# Patient Record
Sex: Female | Born: 1970 | Race: Black or African American | Hispanic: No | Marital: Single | Smoking: Never smoker
Health system: Southern US, Community
[De-identification: ages and names within clinical notes are randomized; demographics above are authoritative.]

## PROBLEM LIST (undated history)

## (undated) DIAGNOSIS — T7840XA Allergy, unspecified, initial encounter: Secondary | ICD-10-CM

## (undated) DIAGNOSIS — Z5189 Encounter for other specified aftercare: Secondary | ICD-10-CM

## (undated) DIAGNOSIS — E785 Hyperlipidemia, unspecified: Secondary | ICD-10-CM

## (undated) DIAGNOSIS — I10 Essential (primary) hypertension: Secondary | ICD-10-CM

## (undated) DIAGNOSIS — B001 Herpesviral vesicular dermatitis: Secondary | ICD-10-CM

## (undated) DIAGNOSIS — E049 Nontoxic goiter, unspecified: Secondary | ICD-10-CM

## (undated) HISTORY — DX: Nontoxic goiter, unspecified: E04.9

## (undated) HISTORY — DX: Herpesviral vesicular dermatitis: B00.1

## (undated) HISTORY — DX: Allergy, unspecified, initial encounter: T78.40XA

## (undated) HISTORY — DX: Encounter for other specified aftercare: Z51.89

## (undated) HISTORY — DX: Hyperlipidemia, unspecified: E78.5

## (undated) HISTORY — PX: COLONOSCOPY: SHX174

---

## 2005-08-04 ENCOUNTER — Inpatient Hospital Stay (HOSPITAL_COMMUNITY): Admission: EM | Admit: 2005-08-04 | Discharge: 2005-08-05 | Payer: Self-pay | Admitting: Emergency Medicine

## 2005-08-04 ENCOUNTER — Ambulatory Visit: Payer: Self-pay | Admitting: Internal Medicine

## 2005-08-09 ENCOUNTER — Ambulatory Visit: Payer: Self-pay | Admitting: Internal Medicine

## 2006-05-02 ENCOUNTER — Observation Stay (HOSPITAL_COMMUNITY): Admission: AD | Admit: 2006-05-02 | Discharge: 2006-05-03 | Payer: Self-pay | Admitting: *Deleted

## 2006-05-05 ENCOUNTER — Other Ambulatory Visit: Admission: RE | Admit: 2006-05-05 | Discharge: 2006-05-05 | Payer: Self-pay | Admitting: Obstetrics and Gynecology

## 2006-07-22 HISTORY — PX: TOTAL ABDOMINAL HYSTERECTOMY: SHX209

## 2006-07-22 HISTORY — PX: ABDOMINAL HYSTERECTOMY: SHX81

## 2007-03-06 ENCOUNTER — Ambulatory Visit: Payer: Self-pay | Admitting: Internal Medicine

## 2007-03-06 ENCOUNTER — Inpatient Hospital Stay (HOSPITAL_COMMUNITY): Admission: EM | Admit: 2007-03-06 | Discharge: 2007-03-07 | Payer: Self-pay | Admitting: Emergency Medicine

## 2007-07-07 ENCOUNTER — Inpatient Hospital Stay (HOSPITAL_COMMUNITY): Admission: RE | Admit: 2007-07-07 | Discharge: 2007-07-10 | Payer: Self-pay | Admitting: Obstetrics and Gynecology

## 2007-07-08 ENCOUNTER — Encounter (INDEPENDENT_AMBULATORY_CARE_PROVIDER_SITE_OTHER): Payer: Self-pay | Admitting: Obstetrics and Gynecology

## 2007-07-23 DIAGNOSIS — R1900 Intra-abdominal and pelvic swelling, mass and lump, unspecified site: Secondary | ICD-10-CM | POA: Insufficient documentation

## 2007-08-21 ENCOUNTER — Encounter: Admission: RE | Admit: 2007-08-21 | Discharge: 2007-08-21 | Payer: Self-pay | Admitting: Allergy and Immunology

## 2007-09-10 ENCOUNTER — Emergency Department (HOSPITAL_COMMUNITY): Admission: EM | Admit: 2007-09-10 | Discharge: 2007-09-10 | Payer: Self-pay | Admitting: Family Medicine

## 2008-02-06 ENCOUNTER — Emergency Department (HOSPITAL_COMMUNITY): Admission: EM | Admit: 2008-02-06 | Discharge: 2008-02-06 | Payer: Self-pay | Admitting: Emergency Medicine

## 2008-02-08 ENCOUNTER — Emergency Department (HOSPITAL_COMMUNITY): Admission: EM | Admit: 2008-02-08 | Discharge: 2008-02-08 | Payer: Self-pay | Admitting: Family Medicine

## 2008-02-22 ENCOUNTER — Encounter (HOSPITAL_COMMUNITY): Admission: RE | Admit: 2008-02-22 | Discharge: 2008-04-14 | Payer: Self-pay | Admitting: Internal Medicine

## 2008-02-29 ENCOUNTER — Emergency Department (HOSPITAL_COMMUNITY): Admission: EM | Admit: 2008-02-29 | Discharge: 2008-02-29 | Payer: Self-pay | Admitting: Emergency Medicine

## 2008-03-03 ENCOUNTER — Encounter (INDEPENDENT_AMBULATORY_CARE_PROVIDER_SITE_OTHER): Payer: Self-pay | Admitting: Diagnostic Radiology

## 2008-03-03 ENCOUNTER — Other Ambulatory Visit: Admission: RE | Admit: 2008-03-03 | Discharge: 2008-03-03 | Payer: Self-pay | Admitting: Diagnostic Radiology

## 2008-03-03 ENCOUNTER — Encounter: Admission: RE | Admit: 2008-03-03 | Discharge: 2008-03-03 | Payer: Self-pay | Admitting: Internal Medicine

## 2008-03-21 ENCOUNTER — Emergency Department (HOSPITAL_COMMUNITY): Admission: EM | Admit: 2008-03-21 | Discharge: 2008-03-21 | Payer: Self-pay | Admitting: Family Medicine

## 2008-06-08 ENCOUNTER — Emergency Department (HOSPITAL_COMMUNITY): Admission: EM | Admit: 2008-06-08 | Discharge: 2008-06-09 | Payer: Self-pay | Admitting: Emergency Medicine

## 2010-08-12 ENCOUNTER — Encounter: Payer: Self-pay | Admitting: Internal Medicine

## 2010-11-14 ENCOUNTER — Other Ambulatory Visit: Payer: Self-pay | Admitting: Internal Medicine

## 2010-11-14 DIAGNOSIS — E041 Nontoxic single thyroid nodule: Secondary | ICD-10-CM

## 2010-11-16 ENCOUNTER — Ambulatory Visit (HOSPITAL_COMMUNITY)
Admission: RE | Admit: 2010-11-16 | Discharge: 2010-11-16 | Disposition: A | Discharge status: Home / Self Care | Payer: Managed Care, Other (non HMO) | Source: Ambulatory Visit | Attending: Internal Medicine | Admitting: Internal Medicine

## 2010-11-16 DIAGNOSIS — E041 Nontoxic single thyroid nodule: Secondary | ICD-10-CM | POA: Insufficient documentation

## 2010-12-04 NOTE — H&P (Signed)
NAMEREATHA, SUR NO.:  [id number]   MEDICAL RECORD NO.:  [id number]          PATIENT TYPE:  EMS   LOCATION:  MAJO                         FACILITY:  [facility]   PHYSICIAN:  Corwin Levins, MD      DATE OF BIRTH:  Oct 01, 1970   DATE OF ADMISSION:  03/06/2007  DATE OF DISCHARGE:                              HISTORY & PHYSICAL   CHIEF COMPLAINT:  Worsening palpitations, weakness, dyspnea on exertion  and dizziness over the last several days.   HISTORY OF PRESENT ILLNESS:  Rebecca Soto is a 40 year old white female  with known uterine fibroids and recurrent anemia secondary to  menorrhagia, here with symptomatic anemia.  She was trying to make it to  December 2008 where she was consented to do a proposed fibroid ablation  type procedure but this had been put off due to work and family  logistics.  She is divorced, has one son and mother with a brain tumor.  She is now for admission for transfusion and further evaluation if  needed.   PAST MEDICAL HISTORY ILLNESSES:  1. Anemia.  2. Acute uterine fibroids.  3. Asthma.  4. Obesity.   SURGERIES:  None.   ALLERGIES:  ASPIRIN, BENADRYL, IVP DYE AND LATEX.   CURRENT MEDICATIONS:  1. Niferex 150 b.i.d.  2. Albuterol metered dose inhaler p.r.n.   SOCIAL HISTORY:  No tobacco.  Alcohol occasional.  Divorced, one child,  works at accounts payable and a history of hypertension and lung cancer  and mother with brain tumor as well.   REVIEW OF SYSTEMS:  Otherwise noncontributory except for a low grade  temperature and tooth ache.   PHYSICAL EXAMINATION:  VITAL SIGNS:  Blood pressure 113/71, heart rate  101, respirations 20, O2 saturation 100%, temperature is 99.4.  ENT:  Sclerae are clear.  TMs clear.  Pharynx benign.  There is some  gingivitis.  NECK:  Without lymphadenopathy, JVD, thyromegaly.  CHEST:  No rales or wheezing.  CARDIAC:  Regular rate and rhythm.  ABDOMEN:  Soft, nontender, positive bowel sounds.  EXTREMITIES:  No edema.   LABORATORY:  Hemoglobin 4.9, white blood cell count 4.8, MCV 65.2.  Electrolytes with a potassium of 3.1.  UA was 11 to 20 red blood cells,  otherwise, normal.  BUN 9, creatinine 0.8, glucose 89.  Urine pregnancy  negative.   ASSESSMENT AND PLAN:  1. Anemia, severe and symptomatic, now for admission for transfusion.      She is already ordered 2 units of packed red blood cells.  She will      likely need at least a total of 3 units of packed red blood cells      especially with an upcoming menses to start per patient.  2. Hypokalemia:  Have her placed p.o.  3. Menorrhagia secondary to uterine fibroid.  She will need close GYN      followup.  4. Other medical problems, otherwise, stable.  5. Prophylaxis:  Apply SCDs and get PVI therapy.  6. Gingivitis:  Start on amoxicillin p.o.  7. Code status:  Full.   DISPOSITION:  For home, unimproved.      Corwin Levins, MD  Electronically Signed     JWJ/MEDQ  D:  03/06/2007  T:  03/07/2007  Job:  [id number]   cc:   Rebecca Ferrara, MD  Rebecca Neighbors [name], MD

## 2010-12-04 NOTE — H&P (Signed)
Rebecca Soto, Rebecca Soto NO.:  [id number]   MEDICAL RECORD NO.:  [id number]          PATIENT TYPE:  AMB   LOCATION:  SDC                           [facility]   PHYSICIAN:  Charles A. Delcambre, MDDATE OF BIRTH:  1971/04/03   DATE OF ADMISSION:  07/07/2007  DATE OF DISCHARGE:                              HISTORY & PHYSICAL   The patient is to be transfused in the surgery clinic on July 08, 2007.   PAST MEDICAL HISTORY:  1. Fibroid uterus.  2. Chronic anemia, transfused in the past.  3. Asthma.   PAST SURGICAL HISTORY:  None.   PAST OBSTETRICAL HISTORY:  SVD x1.   MEDICATIONS:  1. Albuterol inhaler as needed.  2. Iron 150 mg twice a day.   ALLERGIES:  BENADRYL, CT DYE, LATEX, ASPIRIN SHE STATES, REACTION NOT  SPECIFIED.   SOCIAL HISTORY:  No tobacco, ethanol, or drug use.  No STD exposure in  the past.  She is not married and not sexually active at this time.   FAMILY HISTORY:  Father deceased at age 52 of cancer, not specified.  Mother age 88 with asthma.  Brother age 69 with hypertension.  Mother  also had hypertension.  No major illnesses otherwise.   REVIEW OF SYSTEMS:  No fevers, chills, rash, headache, dizziness,  seasonal allergies, chest pain, shortness of breath, wheezing, diarrhea,  constipation, bleeding, melena, hematochezia, urgency, frequency,  dysuria, incontinence, hematuria,  or emotional changes.   PHYSICAL EXAMINATION:  GENERAL:  Alert and oriented x3.  VITAL SIGNS:  Blood pressure 128/84, heart rate 88, respirations 20.  LUNGS:  Clear bilaterally.  HEART:  Regular rate and rhythm without murmurs, rubs, or gallops.  ABDOMEN:  Soft, flat, nontender.  No hepatosplenomegaly or masses noted.  In the lower abdomen, I can palpate the uterus up to about 18-week size  and irregular.  She requests removal of the uterus and cervix if  possible and preservation of the ovaries.  I told her that this would  require a vertical skin  incision, and she consents.  All questions were  answered.  We will proceed as outlined.   She accepts the risks of infection, bleeding, bowel or bladder damage,  blood product risks including hepatitis B and HIV exposure, the risks of  DVT, damage to ureters.  She understands that we will need to, with her  hemoglobin today at 7.7, admit the evening prior and likely give 2 units  of packed red cells to get her hemoglobin up to around 9.5-10, and this  has been accepted and will proceed with surgery.  Orders were written to  receive 2 units of blood and be admitted the evening __________  1700 on  July 07, 2007 for surgery on July 08, 2007 at 8:30 in the  morning.  All questions were answered, and we will proceed as outlined.  We will preop with Tylenol and Benadryl for each unit.  SCDs will be  used perioperatively.  EKG per anesthesia.  Preop with anesthesia.  She  will be admitted for type and cross.  CBC  will be drawn at this time as  well.  Cefotan 2 grams also will be given.      Charles A. [name], MD  Electronically Signed     CAD/MEDQ  D:  06/29/2007  T:  06/30/2007  Job:  [id number]

## 2010-12-04 NOTE — Discharge Summary (Signed)
Rebecca Soto, Rebecca Soto NO.:  [id number]   MEDICAL RECORD NO.:  [id number]          PATIENT TYPE:  INP   LOCATION:  5731                         FACILITY:  [facility]   PHYSICIAN:  Rebecca Soto, MDDATE OF BIRTH:  11/04/1970   DATE OF ADMISSION:  03/06/2007  DATE OF DISCHARGE:  03/07/2007                               DISCHARGE SUMMARY   The primary care Rebecca Soto is Rebecca Ferrara, MD.   DISCHARGE DIAGNOSES:  1. Anemia secondary to menorrhagia (history of uterine fibroid).  2. Uterine fibroid (10 cm as per the patient's history).  3. Hypokalemia, repleted.  4. Suspect urinary tract infection.   DISCHARGE MEDICATIONS:  1. Cipro 500 mg p.o. twice daily for 5 days, then stop.  2. Diflucan 150 mg p.o. stat, then repeat after one week x1 dose for      possible vaginal candidiasis secondary to antibiotics.  3. Niferex 150 mg p.o. twice daily.  4. Albuterol inhaler p.r.n.   PERTINENT LABS:  On admission the patient's hemoglobin was 4.9.  On  discharge, hemoglobin had gone up to 7.7 after 3 units of blood  transfusion.  Potassium was 3.8 on discharge.   BRIEF HISTORY AND HOSPITAL COURSE:  Please refer to the H&P done by Dr.  [name].  The patient is a 40 year old female with past medical  history significant for anemia, uterine fibroid, asthma, and obesity.  The patient is said to have a 10-cm uterine fibroid.  The patient has  only one child and hopes to have one more child.  A conservative  approach is being followed for now for the treatment of this fibroid.  The patient has a history of recurrent anemia secondary to menorrhagia.  The patient presented with another episode of symptomatic anemia.  She  was feeling very weak and fatigued.  Hemoglobin done on admission  revealed 4.9.  She continues to endorse a history of menorrhagia.   The patient was admitted to the regular medical floor.  She was  transfused with 3 units of packed red blood cells.  The  hemoglobin is up  to 7.7 g/dl now.  The patient is eager to be discharged back home.  Urine microscopy revealed many bacteria.  Urine will be taken for  culture and sensitivity.  The primary care Rebecca Soto should please follow  the results of the urine culture and sensitivities.  Meanwhile, we will  start the patient on Cipro 500 mg p.o. b.i.d. for the next 5 days.  The  patient was also prescribed Diflucan in case she develops candidiasis  secondary to the antibiotic.   The patient feels well.  She feels back to her baseline.  She will be  discharged home today to the care of the primary care Rebecca Soto, Dr. Lucita Soto.   DISCHARGE PLANS:  1. Discharge the patient home today.  2. Follow up with the primary care Rebecca Soto, Dr. Flonnie Soto, in 1 week.  3. Follow up with the OB/GYN, Dr. Sydnee Soto.  4. Cipro 500 mg p.o. for 5 days.  5. The primary care Rebecca Soto should please follow the results of urine  culture and sensitivities.      Rebecca Chapel, MD  Electronically Signed     SIO/MEDQ  D:  03/07/2007  T:  03/07/2007  Job:  [id number]   cc:   Rebecca Ferrara, MD  [name]. Rebecca Cabal, MD

## 2010-12-04 NOTE — Op Note (Signed)
NAMEASHTON, Rebecca Soto NO.:  [id number]   MEDICAL RECORD NO.:  [id number]          PATIENT TYPE:  INP   LOCATION:  9308                          [facility]ILITY[facility]   PHYSICIAN:  Charles A. Delcambre, MDDATE OF BIRTH:  [birth year]   DATE OF PROCEDURE:  07/07/2007  DATE OF DISCHARGE:                               OPERATIVE REPORT   PREOPERATIVE DIAGNOSES:  1. Menorrhagia.  2. Anemia.  3. Uterine fibroids 18 weeks.   POSTOPERATIVE DIAGNOSES:  1. Menorrhagia.  2. Anemia.  3. Uterine fibroids 18 weeks.  4. Endometriosis, mild.   PROCEDURE:  Transabdominal hysterectomy.   SURGEON:  Charles A. [name], MD   ASSISTANT:  Gerald Leitz, MD   COMPLICATIONS:  None.   FINDINGS:  A 18-week uterus with normal-appearing fibroids, leiomyomata.  Normal ovaries.  Single endometriosis implant, superficial less than 5  mm, on the right uterosacral ligament -- resected.   SPECIMEN:  Uterus and cervix to pathology.   COUNT:  Instruments, sponge and needle counts correct x2.   BLOOD LOSS:  1 mL.   COMPLICATIONS:  None.   DESCRIPTION OF PROCEDURE:  The patient was taken to the operating room  and placed in supine position.  General anesthetic was induced without  difficulty.  She was sterilely prepped and draped.  A Pfannenstiel  incision was made with a knife and carried down to fascia.  The fascia  was incised with a knife and Mayo scissors.  Rectus muscles were bluntly  and sharply dissected in the midline.  Peritoneum was entered with  Metzenbaum scissors, after releasing the rectus sheath superiorly and  inferiorly.  There was no damage to bowel or vascular structures.  The  peritoneum was incised with the Metzenbaum scissors superiorly and  inferiorly.  Balfour retractor was placed.  Moistened laps x2 were used  to pack the bowel away from the uterus.  The round ligaments were  transected bilaterally and transfixion stitched.  Bladder flap was  started to be opened  anteriorly, but was not completely done secondary  to the bulkiness of the uterus.  Utero-ovarian pedicles were isolated on  either side, and these pedicles were free-tied and then transfixion  stitched with 0 Vicryl; good hemostasis.  Two pedicles were taken in  order to achieve getting the uterine vessels on the left; these were  simple stitched and hemostasis was excellent.  Uterine vessels were  taken on the right, after the bladder was dropped down anteriorly, with  good hemostasis.  Further development of the bladder flap was carried  out with sharp dissection and some blunt dissection.  The cervix was  amputated from the uterine fundus to allow better visualization.  Successive pedicles down either side including the uterosacral ligaments  and the vaginal angles were taken, transfixion stitched with 0 Vicryl.  Good hemostasis.  The vagina was amputated and the cervix was amputated  from the vagina.  Richardson angle sutures were placed with 0 Vicryl.  Good hemostasis.  Running, locking 0 Vicryl was used to close the cuffs  with good hemostasis.  Irrigation was carried out.  Minor  electrocautery  around the cuff was undertaken, without damage to surrounding  structures; re-irrigation of all pedicles was done and all pedicles were  visualized -- seen to be of good hemostasis.  All instruments were  removed.  Counts were correct x2.  Peritoneum was closed with 2-0 Vicryl  running, non-locking suture.  Subfascial hemostasis was excellent.  Irrigation was carried out.  Fascia was closed with #1 PDS running  nonlocking sutures.  Irrigation was carried out once again.  Zero plain  gut was used in 4 interrupted sutures to close the extensive  subcutaneous layer.  Sterile skin clips were applied.  Sterile dressing  was applied.  The patient was taken to recovery with physician in  attendance, having tolerated the procedure well.      Charles A. [name], MD  Electronically  Signed     CAD/MEDQ  D:  07/08/2007  T:  07/08/2007  Job:  [id number]

## 2010-12-07 NOTE — Discharge Summary (Signed)
Rebecca Soto, Rebecca Soto NO.:  [id number]   MEDICAL RECORD NO.:  [id number]          PATIENT TYPE:  INP   LOCATION:  9308                          [facility]   PHYSICIAN:  Charles A. Delcambre, MDDATE OF BIRTH:  Mar 28, 1971   DATE OF ADMISSION:  07/07/2007  DATE OF DISCHARGE:  07/10/2007                               DISCHARGE SUMMARY   PRIMARY DISCHARGE DIAGNOSES:  1. Menorrhagia.  2. Anemia.  3. Fibroids, 18 weeks size.  4. Endometriosis.   H&P is dictated and on the chart.   Disposition.  Follow up in three days to d/c staples.  Percocet 5/325 1-  2 po g 4 hr prn, Motrin 800 mg po q 8 hrs.  Notify t>100, increased pain  or inc drainage or erythema.   PROCEDURE:  Transabdominal hysterectomy.  She preferred to keep her  ovaries at age 77 and I concur.   LABORATORY:  She was admitted overnight and received 3 units of packed  red blood cells.  Post-op hgb and hct was 10.2 and 30.4.  She was taken  to surgery the next day.   She had a routine postoperative course.  Postoperative hemoglobin and  hematocrit noted above.  She continued to have a routine course and,  thereafter, was discharged home on July 10, 2007, postop day 3.  History and physical is dictated on the chart.      Charles A. [name], MD  Electronically Signed     CAD/MEDQ  D:  08/04/2007  T:  08/04/2007  Job:  [id number]

## 2010-12-07 NOTE — Discharge Summary (Signed)
[name], [name] NO.:  [id number]   MEDICAL RECORD NO.:  [id number]          PATIENT TYPE:  INP   LOCATION:  5530                         FACILITY:  [facility]   PHYSICIAN:  Duncan Dull, M.D.     DATE OF BIRTH:  [birth year]   DATE OF ADMISSION:  08/04/2005  DATE OF DISCHARGE:  08/05/2005                                 DISCHARGE SUMMARY   DISCHARGE DIAGNOSIS:  1.  Symptomatic iron deficiency anemia from acute blood loss.  2.  History of menorrhagia secondary to uterine fibroids.  3.  History of mild intermittent asthma.   DISCHARGE MEDICATIONS:  1.  Niferex 150 mg p.o. b.i.d.  2.  Albuterol MDI p.r.n.   CONDITION ON DISCHARGE:  The patient is stable at the time of discharge.  Her hemoglobin has risen significantly status post 3 units of packed red  blood cells.  She will follow up in the outpatient clinic for a check of her  hemoglobin on August 09, 2005.  We have arranged for her to follow up with  Dr. Antionette Char on August 19, 2005, for management of her  menorrhagia secondary to uterine fibroids.   PROCEDURES:  There were no procedures done during this hospitalization.   CONSULTATIONS:  There wee no consultations during this hospitalization.   BRIEF ADMISSION HISTORY AND PHYSICAL:  Rebecca Soto is a 40 year old African  American female with a past medical history significant for asthma and heavy  menstrual periods.  She bleeds approximately 10 days or more each month for  the last six years.  She does have a history of anemia secondary to this and  is taking iron supplementation.  She has had a history of syncope secondary  to anemia from her menorrhagia for which she did have a gynecologic workup.  There was a plan for hysterectomy versus myomectomy, however, she never had  this done.  She has been bleeding for the past eight days and has noticed  that she has been feeling progressively light headed when standing.  Today,  she called her  primary care physician back in [place] as she recently  moved.  She was told to come to the emergency room.  She denies any chest  pain.  She denies any shortness of breath.  She states that her menses  actually ended yesterday.   ALLERGIES:  Latex and IV contrast causes hives.   PHYSICAL EXAMINATION:  Notable for a temperature 97.6, blood pressure  105/71, she was not orthostatic.  Her pulse was 97.  She as 100% on room air  and her respiratory rate was 20.  Physical exam was notable for pale  conjunctiva.  She also had a systolic ejection murmur, 2/6, non-radiating at  the base of the heart.  Her skin was pale and warm, but there were no  lesions.  Please see full admission H&P for further details.   ADMISSION LABORATORY DATA:  White blood cell count 5, hemoglobin 6,  hematocrit 21.3, platelets 595.  MCV was 59.7.  Complete metabolic panel was  unremarkable.   HOSPITAL COURSE:  Problem 1:  Severe anemia secondary to menorrhagia secondary to uterine  fibroids, most likely.  She was transfused a total of 3 units of packed red  blood cells and her hemoglobin rose from 6 on admission to 9 at the time of  discharge.  Workup of her anemia just showed severe iron deficiency anemia.  Again, this was all felt to be likely secondary to her long history of  menorrhagia and uterine fibroids.  Now that she has stopped bleeding, we  felt that it was safe to discharge her home.  She will, again, follow up at  the clinic for recheck of her hemoglobin.  The underlying issue of her  fibroids really needs to be addressed by OB/GYN and she is scheduled to have  a follow up appointment with Dr. Tamela Oddi at the end of this month.   Problem 2:  Asthma.  This was stable during the hospitalization.  She was  given her Albuterol inhaler as needed, however, she did not require this and  her oxygen saturations were normal throughout the hospitalization.   Problem 3:  She did report a history of cold  intolerance on admission, a TSH  was checked and was 1.026 within normal limits, this is felt to be  noncontributory to her current illness.   DISCHARGE LABS AND VITAL SIGNS:  On the day of discharge, her temperature  was 98.4, pulse 77, respirations 20, blood pressure 116/73, she was  saturating 100% on room air.  Her CBC at the time of discharge with a white  blood cell count 5.4, hemoglobin 9, hematocrit 28.8, platelets 495.  BMP on  the day of discharge with a sodium 139, potassium 3.8, chloride 112, bicarb  24, glucose 93, BUN 6, creatinine 0.7, calcium 8.9.  Other significant labs  include a TSH 1.026, folate 12.6, vitamin B12 473.  Iron was less than 10.  Ferritin less than 1.  Protime 13, INR 1, bilirubin 0.5, alkaline phos 50,  AST 13, ALT 8, total protein 7.2, albumin 3.7.  There are no pending labs at  the time of this dictation.      Inis Sizer, M.D.      Duncan Dull, M.D.  Electronically Signed    DC/MEDQ  D:  08/17/2005  T:  08/17/2005  Job:  [id number]   cc:   Roseanna Rainbow, M.D.  Fax: [phone]

## 2011-04-10 ENCOUNTER — Other Ambulatory Visit: Payer: Self-pay | Admitting: *Deleted

## 2011-04-10 DIAGNOSIS — Z1231 Encounter for screening mammogram for malignant neoplasm of breast: Secondary | ICD-10-CM

## 2011-04-17 ENCOUNTER — Ambulatory Visit
Admission: RE | Admit: 2011-04-17 | Discharge: 2011-04-17 | Disposition: A | Discharge status: Home / Self Care | Payer: Managed Care, Other (non HMO) | Source: Ambulatory Visit | Attending: *Deleted | Admitting: *Deleted

## 2011-04-17 DIAGNOSIS — Z1231 Encounter for screening mammogram for malignant neoplasm of breast: Secondary | ICD-10-CM

## 2011-04-19 LAB — POCT URINALYSIS DIP (DEVICE)
Ketones, ur: NEGATIVE
Operator id: 282151
Protein, ur: NEGATIVE
Specific Gravity, Urine: 1.02
Urobilinogen, UA: 1

## 2011-04-19 LAB — GC/CHLAMYDIA PROBE AMP, GENITAL
Chlamydia, DNA Probe: NEGATIVE
GC Probe Amp, Genital: NEGATIVE

## 2011-04-26 LAB — TYPE AND SCREEN
ABO/RH(D): B POS
Antibody Screen: POSITIVE
Donor AG Type: NEGATIVE
Donor AG Type: NEGATIVE
Donor AG Type: NEGATIVE

## 2011-04-26 LAB — CBC
Hemoglobin: 6.9 — CL
MCV: 62 — ABNORMAL LOW
MCV: 70.8 — ABNORMAL LOW
RBC: 3.67 — ABNORMAL LOW
RDW: 16.8 — ABNORMAL HIGH
WBC: 3.3 — ABNORMAL LOW
WBC: 8.9

## 2011-04-26 LAB — URINALYSIS, ROUTINE W REFLEX MICROSCOPIC
Glucose, UA: NEGATIVE
Leukocytes, UA: NEGATIVE
Nitrite: NEGATIVE
Protein, ur: 30 — AB
Specific Gravity, Urine: >1.03 — ABNORMAL HIGH
Urobilinogen, UA: 0.2

## 2011-04-26 LAB — URINE MICROSCOPIC-ADD ON

## 2011-05-03 LAB — POCT PREGNANCY, URINE: Operator id: 272551

## 2011-05-03 LAB — CROSSMATCH

## 2011-05-03 LAB — DIFFERENTIAL
Lymphocytes Relative: 47 — ABNORMAL HIGH
Monocytes Relative: 7
nRBC: 0

## 2011-05-03 LAB — I-STAT 8, (EC8 V) (CONVERTED LAB)
Acid-base deficit: 4 — ABNORMAL HIGH
BUN: 9
Chloride: 105
Glucose, Bld: 89
Potassium: 3.1 — ABNORMAL LOW
pCO2, Ven: 39.8 — ABNORMAL LOW
pH, Ven: 7.344 — ABNORMAL HIGH

## 2011-05-03 LAB — CBC
Hemoglobin: 4.9 — CL
Hemoglobin: 7.7 — CL
MCHC: 27.7 — ABNORMAL LOW
MCHC: 31
MCV: 55.2 — ABNORMAL LOW
MCV: 67.1 — ABNORMAL LOW
RBC: 3.7 — ABNORMAL LOW
WBC: 4.8

## 2011-05-03 LAB — BASIC METABOLIC PANEL
CO2: 22
Chloride: 108
GFR calc Af Amer: >60
Glucose, Bld: 91
Sodium: 138

## 2011-05-03 LAB — POCT I-STAT CREATININE: Creatinine, Ser: 0.8

## 2011-05-03 LAB — URINE CULTURE

## 2011-05-03 LAB — URINALYSIS, ROUTINE W REFLEX MICROSCOPIC
Bilirubin Urine: NEGATIVE
Ketones, ur: NEGATIVE
Leukocytes, UA: NEGATIVE
Nitrite: NEGATIVE
Specific Gravity, Urine: 1.022
Urobilinogen, UA: 0.2

## 2011-05-03 LAB — URINE MICROSCOPIC-ADD ON

## 2011-10-16 ENCOUNTER — Emergency Department (HOSPITAL_COMMUNITY)
Admission: EM | Admit: 2011-10-16 | Discharge: 2011-10-16 | Disposition: A | Discharge status: Home / Self Care | Payer: Managed Care, Other (non HMO) | Attending: Emergency Medicine | Admitting: Emergency Medicine

## 2011-10-16 ENCOUNTER — Encounter (HOSPITAL_COMMUNITY): Payer: Self-pay | Admitting: *Deleted

## 2011-10-16 DIAGNOSIS — J45909 Unspecified asthma, uncomplicated: Secondary | ICD-10-CM | POA: Insufficient documentation

## 2011-10-16 DIAGNOSIS — E079 Disorder of thyroid, unspecified: Secondary | ICD-10-CM | POA: Insufficient documentation

## 2011-10-16 DIAGNOSIS — L0231 Cutaneous abscess of buttock: Secondary | ICD-10-CM | POA: Insufficient documentation

## 2011-10-16 DIAGNOSIS — L03317 Cellulitis of buttock: Secondary | ICD-10-CM | POA: Insufficient documentation

## 2011-10-16 MED ORDER — FLUCONAZOLE 200 MG PO TABS: 200.0000 mg | ORAL_TABLET | Freq: Every day | ORAL | Status: AC

## 2011-10-16 MED ORDER — HYDROCODONE-ACETAMINOPHEN 5-500 MG PO TABS: 1.0000 | ORAL_TABLET | Freq: Four times a day (QID) | ORAL | Status: AC | PRN

## 2011-10-16 MED ORDER — SULFAMETHOXAZOLE-TRIMETHOPRIM 800-160 MG PO TABS: 1.0000 | ORAL_TABLET | Freq: Two times a day (BID) | ORAL | Status: AC

## 2011-10-16 NOTE — Discharge Instructions (Signed)
Keep area clean and dry. Apply warm compresses several times a day. Try to keep packing in, cover with dressing. Take antibiotic as prescribed until all gone. Take tylenol or motrin for pain, take vicodin as prescribed as needed for severe pain. Follow up in 2 days for recheck, either with your doctor or here.   Abscess An abscess (boil or furuncle) is an infected area that contains a collection of pus.  SYMPTOMS Signs and symptoms of an abscess include pain, tenderness, redness, or hardness. You may feel a moveable soft area under your skin. An abscess can occur anywhere in the body.  TREATMENT  A surgical cut (incision) may be made over your abscess to drain the pus. Gauze may be packed into the space or a drain may be looped through the abscess cavity (pocket). This provides a drain that will allow the cavity to heal from the inside outwards. The abscess may be painful for a few days, but should feel much better if it was drained.  Your abscess, if seen early, may not have localized and may not have been drained. If not, another appointment may be required if it does not get better on its own or with medications. HOME CARE INSTRUCTIONS   Only take over-the-counter or prescription medicines for pain, discomfort, or fever as directed by your caregiver.   Take your antibiotics as directed if they were prescribed. Finish them even if you start to feel better.   Keep the skin and clothes clean around your abscess.   If the abscess was drained, you will need to use gauze dressing to collect any draining pus. Dressings will typically need to be changed 3 or more times a day.   The infection may spread by skin contact with others. Avoid skin contact as much as possible.   Practice good hygiene. This includes regular hand washing, cover any draining skin lesions, and do not share personal care items.   If you participate in sports, do not share athletic equipment, towels, whirlpools, or personal  care items. Shower after every practice or tournament.   If a draining area cannot be adequately covered:   Do not participate in sports.   Children should not participate in day care until the wound has healed or drainage stops.   If your caregiver has given you a follow-up appointment, it is very important to keep that appointment. Not keeping the appointment could result in a much worse infection, chronic or permanent injury, pain, and disability. If there is any problem keeping the appointment, you must call back to this facility for assistance.  SEEK MEDICAL CARE IF:   You develop increased pain, swelling, redness, drainage, or bleeding in the wound site.   You develop signs of generalized infection including muscle aches, chills, fever, or a general ill feeling.   You have an oral temperature above 102 F (38.9 C).  MAKE SURE YOU:   Understand these instructions.   Will watch your condition.   Will get help right away if you are not doing well or get worse.  Document Released: 04/17/2005 Document Revised: 06/27/2011 Document Reviewed: 02/09/2008 [facility] Patient Information 2012 [place], [place].

## 2011-10-16 NOTE — ED Notes (Signed)
Pt is here with abscess to the top mid buttocks area for 3 days.  Does not think it is draining.  Pt nervous

## 2011-10-16 NOTE — ED Provider Notes (Signed)
History     CSN: [id number]  Arrival date & time 10/16/11  9528   First MD Initiated Contact with Patient 10/16/11 [phone]      Chief Complaint  Patient presents with  . Abscess    upper mid buttocks 3 days    (Consider location/radiation/quality/duration/timing/severity/associated sxs/prior treatment) Patient is a 41 y.o. female presenting with abscess. The history is provided by the patient.  Abscess  This is a new problem. The current episode started less than one week ago. The onset was gradual. Pertinent negatives include no fever.  Pt with swelling, redness, tenderness to the right upper buttocks for 3 days. Getting worse. Pain with sitting. No hx of the same. Denies fever, chills, malaise. No drainage. Did not try any meidcations.   Past Medical History  Diagnosis Date  . Asthma   . Thyroid disease     Past Surgical History  Procedure Date  . Abdominal hysterectomy     No family history on file.  History  Substance Use Topics  . Smoking status: Never Smoker   . Smokeless tobacco: Not on file  . Alcohol Use: Yes     occ    OB History    Grav Para Term Preterm Abortions TAB SAB Ect Mult Living                  Review of Systems  Constitutional: Negative for fever and chills.  HENT: Negative.   Eyes: Negative.   Respiratory: Negative.   Cardiovascular: Negative.   Genitourinary: Negative.   Musculoskeletal: Negative.   Skin:       abscess  Neurological: Negative.   Psychiatric/Behavioral: Negative.     Allergies  Other; Aspirin; and Benadryl allergy  Home Medications   Current Outpatient Rx  Name Route Sig Dispense Refill  . ACETAMINOPHEN 500 MG PO TABS Oral Take 500-1,000 mg by mouth every 6 (six) hours as needed. For pain    . ALBUTEROL SULFATE HFA 108 (90 BASE) MCG/ACT IN AERS Inhalation Inhale 2 puffs into the lungs every 6 (six) hours as needed. For shortness of breath    . BUDESONIDE-FORMOTEROL FUMARATE 160-4.5 MCG/ACT IN AERO Inhalation  Inhale 2 puffs into the lungs 2 (two) times daily.    Marland Kitchen LEVOTHYROXINE SODIUM 75 MCG PO TABS Oral Take 75 mcg by mouth daily.    Frazier Butt OP Ophthalmic Apply 2 drops to eye 2 (two) times daily as needed. For dry eyes      BP 118/70  Pulse 103  Temp 97.7 F (36.5 C)  Resp 18  SpO2 100%  Physical Exam  Nursing note and vitals reviewed. Constitutional: She is oriented to person, place, and time. She appears well-developed and well-nourished. No distress.  HENT:  Head: Normocephalic.  Eyes: Conjunctivae are normal.  Neck: Neck supple.  Cardiovascular: Normal rate and normal heart sounds.   Pulmonary/Chest: Effort normal and breath sounds normal. No respiratory distress. She has no wheezes. She has no rales.  Musculoskeletal: Normal range of motion. She exhibits no edema.  Neurological: She is alert and oriented to person, place, and time.  Skin: Skin is warm and dry.       4cm abscess- area of induration, redness, tenderness to the right upper medial buttocks.   Psychiatric: She has a normal mood and affect.    ED Course  Procedures (including critical care time)  INCISION AND DRAINAGE Performed by: Jaynie Crumble A Consent: Verbal consent obtained. Risks and benefits: risks, benefits and alternatives were  discussed Type: abscess  Body area: left buttocks  Anesthesia: local infiltration  Local anesthetic: lidocaine 2% w/ epinephrine  Anesthetic total: 3 ml  Complexity: complex Blunt dissection to break up loculations  Drainage: purulent  Drainage amount: large  Packing material: 1/4 in iodoform gauze  Patient tolerance: Patient tolerated the procedure well with no immediate complications.   Pt non toxic. VS normal. Abscess I&Ded, packed. No surrounding cellulitis. Will d/c home with recheck in two days  No diagnosis found.    MDM           Lottie Mussel, PA 10/16/11 [phone]

## 2011-10-16 NOTE — ED Provider Notes (Signed)
Medical screening examination/treatment/procedure(s) were performed by non-physician practitioner and as supervising physician I was immediately available for consultation/collaboration.   Hilery Wintle M Tammera Engert, MD 10/16/11 1553 

## 2012-03-19 ENCOUNTER — Other Ambulatory Visit: Payer: Self-pay | Admitting: Family Medicine

## 2012-03-19 DIAGNOSIS — Z1231 Encounter for screening mammogram for malignant neoplasm of breast: Secondary | ICD-10-CM

## 2012-04-17 ENCOUNTER — Ambulatory Visit
Admission: RE | Admit: 2012-04-17 | Discharge: 2012-04-17 | Disposition: A | Discharge status: Home / Self Care | Payer: Managed Care, Other (non HMO) | Source: Ambulatory Visit | Attending: Family Medicine | Admitting: Family Medicine

## 2012-04-17 DIAGNOSIS — Z1231 Encounter for screening mammogram for malignant neoplasm of breast: Secondary | ICD-10-CM

## 2012-12-21 ENCOUNTER — Encounter: Payer: Self-pay | Admitting: Obstetrics & Gynecology

## 2012-12-21 ENCOUNTER — Other Ambulatory Visit: Payer: Self-pay | Admitting: Obstetrics & Gynecology

## 2012-12-21 ENCOUNTER — Ambulatory Visit (INDEPENDENT_AMBULATORY_CARE_PROVIDER_SITE_OTHER): Payer: Managed Care, Other (non HMO) | Admitting: Obstetrics & Gynecology

## 2012-12-21 VITALS — BP 137/91 | HR 76 | Temp 98.7°F | Ht 65.0 in | Wt 247.0 lb

## 2012-12-21 DIAGNOSIS — N898 Other specified noninflammatory disorders of vagina: Secondary | ICD-10-CM

## 2012-12-21 DIAGNOSIS — N949 Unspecified condition associated with female genital organs and menstrual cycle: Secondary | ICD-10-CM

## 2012-12-21 NOTE — Patient Instructions (Signed)
Pelvic Pain Pelvic pain is pain below the belly button and located between your hips. Acute pain may last a few hours or days. Chronic pelvic pain may last weeks and months. The cause may be different for different types of pain. The pain may be dull or sharp, mild or severe and can interfere with your daily activities. Write down and tell your caregiver:   Exactly where the pain is located.  If it comes and goes or is there all the time.  When it happens (with sex, urination, bowel movement, etc.)  If the pain is related to your menstrual period or stress. Your caregiver will take a full history and do a complete physical exam and Pap test. CAUSES   Painful menstrual periods (dysmenorrhea).  Normal ovulation (Mittelschmertz) that occurs in the middle of the menstrual cycle every month.  The pelvic organs get engorged with blood just before the menstrual period (pelvic congestive syndrome).  Scar tissue from an infection or past surgery (pelvic adhesions).  Cancer of the female pelvic organs. When there is pain with cancer, it has been there for a long time.  The lining of the uterus (endometrium) abnormally grows in places like the pelvis and on the pelvic organs (endometriosis).  A form of endometriosis with the lining of the uterus present inside of the muscle tissue of the uterus (adenomyosis).  Fibroid tumor (noncancerous) in the uterus.  Bladder problems such as infection, bladder spasms of the muscle tissue of the bladder.  Intestinal problems (irritable bowel syndrome, colitis, an ulcer or gastrointestinal infection).  Polyps of the cervix or uterus.  Pregnancy in the tube (ectopic pregnancy).  The opening of the cervix is too small for the menstrual blood to flow through it (cervical stenosis).  Physical or sexual abuse (past or present).  Musculo-skeletal problems from poor posture, problems with the vertebrae of the lower back or the uterine pelvic muscles falling  (prolapse).  Psychological problems such as depression or stress.  IUD (intrauterine device) in the uterus. DIAGNOSIS  Tests to make a diagnosis depends on the type, location, severity and what causes the pain to occur. Tests that may be needed include:  Blood tests.  Urine tests  Ultrasound.  X-rays.  CT Scan.  MRI.  Laparoscopy.  Major surgery. TREATMENT  Treatment will depend on the cause of the pain, which includes:  Prescription or over-the-counter pain medication.  Antibiotics.  Birth control pills.  Hormone treatment.  Nerve blocking injections.  Physical therapy.  Antidepressants.  Counseling with a psychiatrist or psychologist.  Minor or major surgery. HOME CARE INSTRUCTIONS   Only take over-the-counter or prescription medicines for pain, discomfort or fever as directed by your caregiver.  Follow your caregiver's advice to treat your pain.  Rest.  Avoid sexual intercourse if it causes the pain.  Apply warm or cold compresses (which ever works best) to the pain area.  Do relaxation exercises such as yoga or meditation.  Try acupuncture.  Avoid stressful situations.  Try group therapy.  If the pain is because of a stomach/intestinal upset, drink clear liquids, eat a bland light food diet until the symptoms go away. SEEK MEDICAL CARE IF:   You need stronger prescription pain medication.  You develop pain with sexual intercourse.  You have pain with urination.  You develop a temperature of 102 F (38.9 C) with the pain.  You are still in pain after 4 hours of taking prescription medication for the pain.  You need depression medication.    Your IUD is causing pain and you want it removed. SEEK IMMEDIATE MEDICAL CARE IF:  You develop very severe pain or tenderness.  You faint, have chills, severe weakness or dehydration.  You develop heavy vaginal bleeding or passing solid tissue.  You develop a temperature of 102 F (38.9 C)  with the pain.  You have blood in the urine.  You are being physically or sexually abused.  You have uncontrolled vomiting and diarrhea.  You are depressed and afraid of harming yourself or someone else. Document Released: 08/15/2004 Document Revised: 09/30/2011 Document Reviewed: 05/12/2008 Exit[employer] Patient Information 2013 [employer].  

## 2012-12-21 NOTE — Progress Notes (Signed)
Subjective:     Rebecca Soto is a 42 y.o. female here for a routine exam.  Current complaints: patient states she has had a history of ovarian cysts. Pt states she has been having pain on her right side. Pt states it is a sharp shooting pain. Pt states she is also having bloating on a regular basis.  Personal health questionnaire reviewed: yes.   Gynecologic History No LMP recorded. Patient has had a hysterectomy. Contraception: status post hysterectomy Last Pap: 2013. Results were: normal Last mammogram: 03/2012. Results were: normal  Obstetric History OB History   Grav Para Term Preterm Abortions TAB SAB Ect Mult Living                   The following portions of the patient's history were reviewed and updated as appropriate: allergies, current medications, past family history, past medical history, past social history, past surgical history and problem list.  Review of Systems Pertinent items are noted in HPI.    Objective:    General appearance: alert Breasts: normal appearance, no masses or tenderness Abdomen: soft, non-tender; bowel sounds normal; no masses,  no organomegaly Pelvic:  external genitalia normal, no adnexal masses or tenderness, and consistency and vagina normal with thick, white discharge   Informal U/S: normal ovaries Assessment:   Pelvic pain  Plan:   Pelvic U/S; review pelvic pain questionnaire

## 2012-12-22 LAB — URINE CULTURE: Colony Count: NO GROWTH

## 2012-12-22 LAB — WET PREP BY MOLECULAR PROBE
Candida species: NEGATIVE
Gardnerella vaginalis: NEGATIVE
Trichomonas vaginosis: POSITIVE — AB

## 2012-12-23 ENCOUNTER — Telehealth: Payer: Self-pay | Admitting: *Deleted

## 2012-12-23 DIAGNOSIS — A5901 Trichomonal vulvovaginitis: Secondary | ICD-10-CM

## 2012-12-23 NOTE — Telephone Encounter (Signed)
Patient called for lab results.  Advised her positive Trichomonas and called Tindamax 2g PO one time dose to [facility] (per protocol).  Advised pt that her partner needs to be treated and no intercourse for 2 weeks after taking the antibiotic.

## 2012-12-27 ENCOUNTER — Encounter: Payer: Self-pay | Admitting: Obstetrics & Gynecology

## 2012-12-27 DIAGNOSIS — A599 Trichomoniasis, unspecified: Secondary | ICD-10-CM | POA: Insufficient documentation

## 2012-12-28 ENCOUNTER — Ambulatory Visit (HOSPITAL_COMMUNITY)
Admission: RE | Admit: 2012-12-28 | Discharge: 2012-12-28 | Disposition: A | Discharge status: Home / Self Care | Payer: Managed Care, Other (non HMO) | Source: Ambulatory Visit | Attending: Obstetrics & Gynecology | Admitting: Obstetrics & Gynecology

## 2012-12-28 DIAGNOSIS — N949 Unspecified condition associated with female genital organs and menstrual cycle: Secondary | ICD-10-CM | POA: Insufficient documentation

## 2012-12-28 DIAGNOSIS — Z90710 Acquired absence of both cervix and uterus: Secondary | ICD-10-CM | POA: Insufficient documentation

## 2012-12-28 DIAGNOSIS — N83209 Unspecified ovarian cyst, unspecified side: Secondary | ICD-10-CM | POA: Insufficient documentation

## 2013-01-12 ENCOUNTER — Encounter: Payer: Self-pay | Admitting: Obstetrics & Gynecology

## 2013-01-12 DIAGNOSIS — N83209 Unspecified ovarian cyst, unspecified side: Secondary | ICD-10-CM | POA: Insufficient documentation

## 2013-01-29 ENCOUNTER — Encounter (HOSPITAL_COMMUNITY): Payer: Self-pay | Admitting: Emergency Medicine

## 2013-01-29 ENCOUNTER — Emergency Department (HOSPITAL_COMMUNITY)
Admission: EM | Admit: 2013-01-29 | Discharge: 2013-01-29 | Disposition: A | Discharge status: Home / Self Care | Payer: Managed Care, Other (non HMO) | Source: Home / Self Care | Attending: Emergency Medicine | Admitting: Emergency Medicine

## 2013-01-29 DIAGNOSIS — J40 Bronchitis, not specified as acute or chronic: Secondary | ICD-10-CM

## 2013-01-29 MED ORDER — CETIRIZINE HCL 10 MG PO CAPS: 1.0000 | ORAL_CAPSULE | Freq: Every day | ORAL | Status: DC

## 2013-01-29 MED ORDER — PREDNISONE 20 MG PO TABS: 40.0000 mg | ORAL_TABLET | Freq: Every day | ORAL | Status: DC

## 2013-01-29 MED ORDER — ALBUTEROL SULFATE HFA 108 (90 BASE) MCG/ACT IN AERS: 1.0000 | INHALATION_SPRAY | Freq: Four times a day (QID) | RESPIRATORY_TRACT | Status: DC | PRN

## 2013-01-29 NOTE — ED Notes (Signed)
Pt c/o persistent cough x3 days... Reports she had a cold about a week ago; its just the cough that still lingering... sxs include: cough w/yellow phlegm; cough is worse at night... Hx of asthma... She is alert w/no signs of acute respiratory distress.

## 2013-01-29 NOTE — ED Provider Notes (Signed)
History    CSN: [id number] Arrival date & time 01/29/13  Rebecca Soto  First MD Initiated Contact with Patient 01/29/13 1853     Chief Complaint  Patient presents with  . Cough   (Consider location/radiation/quality/duration/timing/severity/associated sxs/prior Treatment) HPI Comments: Patient presents urgent care describing for the last 3-4 days she's been coughing more at night sometimes and coughing spells that wake her up occasionally is only dry cough sometimes is able to bring up some yellow-looking phlegm. She believes that the beginning of the week she had somewhat symptoms were similar to a cold as her mother had an upper respiratory infection recently. She has used her albuterol about 2-3 times a day in the last couple days. He denies any shortness of breath at rest or with exertion. Symptoms are more bothersome so at night. She has had some minimal nasal congestion and a scratchy throat and sometimes her ears itch.  Patient is a 42 y.o. female presenting with cough. The history is provided by the patient.  Cough Cough characteristics:  Productive and dry Sputum characteristics:  Clear and yellow Severity:  Mild Onset quality:  Gradual Duration:  3 days Timing:  Constant Progression:  Worsening Chronicity:  New Context: sick contacts and upper respiratory infection   Context: not exposure to allergens and not with activity   Worsened by:  Nothing tried Associated symptoms: shortness of breath, sore throat and wheezing   Associated symptoms: no chest pain, no chills, no diaphoresis, no ear fullness, no ear pain, no eye discharge, no myalgias and no rash   Risk factors: no recent travel    Past Medical History  Diagnosis Date  . Asthma   . Thyroid disease    Past Surgical History  Procedure Laterality Date  . Abdominal hysterectomy     History reviewed. No pertinent family history. History  Substance Use Topics  . Smoking status: Never Smoker   . Smokeless tobacco: Not  on file  . Alcohol Use: Yes     Comment: occassionally   OB History   Grav Para Term Preterm Abortions TAB SAB Ect Mult Living                 Review of Systems  Constitutional: Negative for chills, diaphoresis, activity change and appetite change.  HENT: Positive for congestion and sore throat. Negative for hearing loss, ear pain, sneezing, neck pain, neck stiffness and postnasal drip.   Eyes: Negative for discharge.  Respiratory: Positive for cough, shortness of breath and wheezing.   Cardiovascular: Negative for chest pain.  Musculoskeletal: Negative for myalgias and arthralgias.  Skin: Negative for color change, pallor and rash.  Allergic/Immunologic: Negative for immunocompromised state.    Allergies  Other; Aspirin; Diphenhydramine hcl; and Latex  Home Medications   Current Outpatient Rx  Name  Route  Sig  Dispense  Refill  . albuterol (PROVENTIL HFA;VENTOLIN HFA) 108 (90 BASE) MCG/ACT inhaler   Inhalation   Inhale 2 puffs into the lungs every 6 (six) hours as needed. For shortness of breath         . budesonide-formoterol (SYMBICORT) 160-4.5 MCG/ACT inhaler   Inhalation   Inhale 2 puffs into the lungs 2 (two) times daily.         Marland Kitchen levothyroxine (SYNTHROID, LEVOTHROID) 75 MCG tablet   Oral   Take 75 mcg by mouth daily.         Marland Kitchen acetaminophen (TYLENOL) 500 MG tablet   Oral   Take 500-1,000 mg by mouth every  6 (six) hours as needed. For pain         . albuterol (PROVENTIL HFA;VENTOLIN HFA) 108 (90 BASE) MCG/ACT inhaler   Inhalation   Inhale 1-2 puffs into the lungs every 6 (six) hours as needed for wheezing or shortness of breath.   1 Inhaler   3   . Cetirizine HCl (ZYRTEC ALLERGY) 10 MG CAPS   Oral   Take 1 capsule (10 mg total) by mouth daily. X 2 weeks   30 capsule   1   . Olopatadine HCl (PATADAY) 0.2 % SOLN   Ophthalmic   Apply to eye.         Marland Kitchen omeprazole (PRILOSEC) 40 MG capsule               . Polyethyl Glycol-Propyl Glycol  (SYSTANE OP)   Ophthalmic   Apply 2 drops to eye 2 (two) times daily as needed. For dry eyes         . predniSONE (DELTASONE) 20 MG tablet   Oral   Take 2 tablets (40 mg total) by mouth daily. 2 tablets daily for 5 days   10 tablet   0    BP 126/90  Pulse 89  Temp(Src) 98.6 F (37 C) (Oral)  Resp 12  SpO2 97% Physical Exam  Nursing note and vitals reviewed. Constitutional: Vital signs are normal. She appears well-developed and well-nourished.  Non-toxic appearance. She does not have a sickly appearance. She does not appear ill. No distress.  HENT:  Head: Normocephalic.  Right Ear: Tympanic membrane normal.  Left Ear: Tympanic membrane normal.  Mouth/Throat: Uvula is midline. No oropharyngeal exudate, posterior oropharyngeal edema or tonsillar abscesses.  Eyes: Conjunctivae are normal. No scleral icterus.  Neck: Neck supple. No JVD present.  Cardiovascular: Normal rate.  Exam reveals no gallop and no friction rub.   No murmur heard. Pulmonary/Chest: Effort normal and breath sounds normal. No respiratory distress. She has no decreased breath sounds. She has no wheezes. She has no rhonchi. She has no rales.  Skin: No erythema.    ED Course  Procedures (including critical care time) Labs Reviewed - No data to display No results found. 1. Bronchitis     MDM  Patient looks comfortable afebrile in no respiratory distress. Symptoms were most consistent with a resolving upper respiratory infection with some degree of reactive airway disease/bronchitis. Have instructed patient to complete a five-day prednisone along with Zyrtec and to continue with albuterol.  Have also encouraged patient to return if she continues to cough or any worsening symptoms for a second lung exam. Patient agrees with treatment plan and followup care as necessary.  New Prescriptions   ALBUTEROL (PROVENTIL HFA;VENTOLIN HFA) 108 (90 BASE) MCG/ACT INHALER    Inhale 1-2 puffs into the lungs every 6 (six)  hours as needed for wheezing or shortness of breath.   CETIRIZINE HCL (ZYRTEC ALLERGY) 10 MG CAPS    Take 1 capsule (10 mg total) by mouth daily. X 2 weeks   PREDNISONE (DELTASONE) 20 MG TABLET    Take 2 tablets (40 mg total) by mouth daily. 2 tablets daily for 5 days    Jimmie Molly, MD 01/29/13 [phone]

## 2013-02-07 ENCOUNTER — Emergency Department (INDEPENDENT_AMBULATORY_CARE_PROVIDER_SITE_OTHER)
Admission: EM | Admit: 2013-02-07 | Discharge: 2013-02-07 | Disposition: A | Discharge status: Home / Self Care | Payer: Managed Care, Other (non HMO) | Source: Home / Self Care

## 2013-02-07 ENCOUNTER — Encounter (HOSPITAL_COMMUNITY): Payer: Self-pay

## 2013-02-07 ENCOUNTER — Emergency Department (INDEPENDENT_AMBULATORY_CARE_PROVIDER_SITE_OTHER): Payer: Managed Care, Other (non HMO)

## 2013-02-07 DIAGNOSIS — R059 Cough, unspecified: Secondary | ICD-10-CM

## 2013-02-07 DIAGNOSIS — K5900 Constipation, unspecified: Secondary | ICD-10-CM

## 2013-02-07 DIAGNOSIS — R05 Cough: Secondary | ICD-10-CM

## 2013-02-07 DIAGNOSIS — R058 Other specified cough: Secondary | ICD-10-CM

## 2013-02-07 LAB — POCT URINALYSIS DIP (DEVICE)
Leukocytes, UA: NEGATIVE
Nitrite: NEGATIVE
Protein, ur: NEGATIVE mg/dL
Urobilinogen, UA: 0.2 mg/dL (ref 0.0–1.0)
pH: 7.5 (ref 5.0–8.0)

## 2013-02-07 MED ORDER — GUAIFENESIN-CODEINE 100-10 MG/5ML PO SOLN: 5.0000 mL | Freq: Three times a day (TID) | ORAL | Status: DC | PRN

## 2013-02-07 MED ORDER — POLYETHYLENE GLYCOL 3350 17 GM/SCOOP PO POWD: 17.0000 g | Freq: Every day | ORAL | Status: DC

## 2013-02-07 MED ORDER — BISACODYL 10 MG RE SUPP: 10.0000 mg | RECTAL | Status: DC | PRN

## 2013-02-07 NOTE — ED Notes (Signed)
Patient states that she still has a cough, she was seen on 7/11 and finished her prednisone, states that she does have some abdominal discomfort but has not had good bowel movement in 5 days, also complains of urinary frequency and painful urination

## 2013-02-07 NOTE — ED Provider Notes (Signed)
JAHYRA SUKUP is a 42 y.o. female who presents to Urgent Care today for  1) cough present for 2 weeks. Patient initially developed congestion and cough and was diagnosed with viral URI and given prednisone, albuterol and Zyrtec. This helped some. Her nasal congestion and pressure has resolved however she continues to have persistent slightly productive cough. She denies any trouble breathing or fevers.   2) abdominal pain: Patient notes mild lower abdominal pain present since about one week. She has some urinary frequency and urgency. However she notes abdominal pressure consistent with a need to have a bowel movement. She notes it has been 6 days should she had her last bowel movement which is unusual for her. She denies any nausea vomiting or diarrhea. She denies any blood in her stool.    PMH reviewed. Hypothyroidism, asthma History  Substance Use Topics  . Smoking status: Never Smoker   . Smokeless tobacco: Not on file  . Alcohol Use: Yes     Comment: occassionally   ROS as above Medications reviewed. No current facility-administered medications for this encounter.   Current Outpatient Prescriptions  Medication Sig Dispense Refill  . albuterol (PROVENTIL HFA;VENTOLIN HFA) 108 (90 BASE) MCG/ACT inhaler Inhale 1-2 puffs into the lungs every 6 (six) hours as needed for wheezing or shortness of breath.  1 Inhaler  3  . budesonide-formoterol (SYMBICORT) 160-4.5 MCG/ACT inhaler Inhale 2 puffs into the lungs 2 (two) times daily.      . Cetirizine HCl (ZYRTEC ALLERGY) 10 MG CAPS Take 1 capsule (10 mg total) by mouth daily. X 2 weeks  30 capsule  1  . levothyroxine (SYNTHROID, LEVOTHROID) 75 MCG tablet Take 75 mcg by mouth daily.      . Olopatadine HCl (PATADAY) 0.2 % SOLN Apply to eye.      Marland Kitchen omeprazole (PRILOSEC) 40 MG capsule       . Polyethyl Glycol-Propyl Glycol (SYSTANE OP) Apply 2 drops to eye 2 (two) times daily as needed. For dry eyes      . bisacodyl (DULCOLAX) 10 MG suppository  Place 1 suppository (10 mg total) rectally as needed for constipation.  12 suppository  0  . guaiFENesin-codeine 100-10 MG/5ML syrup Take 5 mLs by mouth 3 (three) times daily as needed for cough.  120 mL  0  . polyethylene glycol powder (MIRALAX) powder Take 17 g by mouth daily.  850 g  0    Exam:  BP 143/88  Pulse 72  Temp(Src) 98.1 F (36.7 C) (Oral)  Resp 16  SpO2 100% Gen: Well NAD HEENT: EOMI,  MMM Lungs: CTABL Nl WOB Heart: RRR no MRG Abd: NABS, NT, ND, no rebound or guarding Exts: Non edematous BL  LE, warm and well perfused.   Results for orders placed during the hospital encounter of 02/07/13 (from the past 24 hour(s))  POCT URINALYSIS DIP (DEVICE)     Status: Abnormal   Collection Time    02/07/13  1:30 PM      Result Value Range   Glucose, UA NEGATIVE  NEGATIVE mg/dL   Bilirubin Urine NEGATIVE  NEGATIVE   Ketones, ur TRACE (*) NEGATIVE mg/dL   Specific Gravity, Urine 1.020  1.005 - 1.030   Hgb urine dipstick TRACE (*) NEGATIVE   pH 7.5  5.0 - 8.0   Protein, ur NEGATIVE  NEGATIVE mg/dL   Urobilinogen, UA 0.2  0.0 - 1.0 mg/dL   Nitrite NEGATIVE  NEGATIVE   Leukocytes, UA NEGATIVE  NEGATIVE   Dg  Chest 2 View  02/07/2013   *RADIOLOGY REPORT*  Clinical Data: Productive cough for approximate 2 weeks.  CHEST - 2 VIEW  Comparison: PA and lateral chest 06/09/2008.  Findings: The lungs are clear.  Heart size is normal.  No pneumothorax or pleural effusion.  IMPRESSION: Negative chest.   Original Report Authenticated By: Holley Dexter, M.D.    Assessment and Plan: 42 y.o. female with  1) post viral cough most likely diagnosis. No evidence of pneumonia today. Plan to treat symptomatically with codeine containing cough syrup.   2) mild abdominal pain. Likely constipation related. Patient has not had a bowel movement in 6 days. Her urinalysis is normal. Plan to treat for constipation with MiraLAX and bisacodyl suppository.  Followup as needed.   Discussed warning signs  or symptoms. Please see discharge instructions. Patient expresses understanding.      Rodolph Bong, MD 02/07/13 1425

## 2013-02-16 ENCOUNTER — Other Ambulatory Visit: Payer: Self-pay | Admitting: *Deleted

## 2013-02-16 DIAGNOSIS — N83209 Unspecified ovarian cyst, unspecified side: Secondary | ICD-10-CM

## 2013-02-17 ENCOUNTER — Other Ambulatory Visit: Payer: Managed Care, Other (non HMO)

## 2013-05-08 ENCOUNTER — Emergency Department (HOSPITAL_COMMUNITY)
Admission: EM | Admit: 2013-05-08 | Discharge: 2013-05-08 | Disposition: A | Discharge status: Home / Self Care | Payer: Managed Care, Other (non HMO) | Source: Home / Self Care | Attending: Family Medicine | Admitting: Family Medicine

## 2013-05-08 ENCOUNTER — Encounter (HOSPITAL_COMMUNITY): Payer: Self-pay | Admitting: Emergency Medicine

## 2013-05-08 DIAGNOSIS — B9681 Helicobacter pylori [H. pylori] as the cause of diseases classified elsewhere: Secondary | ICD-10-CM

## 2013-05-08 DIAGNOSIS — R109 Unspecified abdominal pain: Secondary | ICD-10-CM

## 2013-05-08 DIAGNOSIS — A048 Other specified bacterial intestinal infections: Secondary | ICD-10-CM

## 2013-05-08 LAB — POCT URINALYSIS DIP (DEVICE)
Glucose, UA: NEGATIVE mg/dL
Hgb urine dipstick: NEGATIVE
Protein, ur: NEGATIVE mg/dL
Specific Gravity, Urine: 1.02 (ref 1.005–1.030)
Urobilinogen, UA: 0.2 mg/dL (ref 0.0–1.0)

## 2013-05-08 MED ORDER — RANITIDINE HCL 300 MG PO TABS: 300.0000 mg | ORAL_TABLET | Freq: Every day | ORAL | Status: DC

## 2013-05-08 NOTE — ED Provider Notes (Signed)
CSN: [id number]     Arrival date & time 05/08/13  1218 History   First MD Initiated Contact with Patient 05/08/13 1409     Chief Complaint  Patient presents with  . Abdominal Pain   (Consider location/radiation/quality/duration/timing/severity/associated sxs/prior Treatment) HPI Comments: 42 year old female presents complaining of left upper and epigastric abdominal pain, suprapubic abdominal pain, green stool. She first started having abdominal pain many months ago. She was ultimately treated for H. pyloric gastritis, finishing her treatment and the ear. This this has lead to significant improvement in her abdominal pain. She should today because the pain has only slightly return if she is feeling some bubbling in her stomach. She also has potent smelling urine and green stool. She denies diarrhea, vomiting, severe abdominal pain, rash, fever, chills. Denies possibility of STDs or pregnancy.  Patient is a 42 y.o. female presenting with abdominal pain.  Abdominal Pain Associated symptoms include abdominal pain. Pertinent negatives include no chest pain and no shortness of breath.    Past Medical History  Diagnosis Date  . Asthma   . Thyroid disease    Past Surgical History  Procedure Laterality Date  . Abdominal hysterectomy     No family history on file. History  Substance Use Topics  . Smoking status: Never Smoker   . Smokeless tobacco: Not on file  . Alcohol Use: Yes     Comment: occassionally   OB History   Grav Para Term Preterm Abortions TAB SAB Ect Mult Living                 Review of Systems  Constitutional: Negative for fever and chills.  Eyes: Negative for visual disturbance.  Respiratory: Negative for cough and shortness of breath.   Cardiovascular: Negative for chest pain, palpitations and leg swelling.  Gastrointestinal: Positive for nausea (mild), abdominal pain and abdominal distention. Negative for vomiting, diarrhea, constipation and blood in stool.   Endocrine: Negative for polydipsia and polyuria.  Genitourinary: Negative for dysuria, urgency and frequency.  Musculoskeletal: Negative for arthralgias and myalgias.  Skin: Negative for rash.  Neurological: Negative for dizziness, weakness and light-headedness.    Allergies  Other; Aspirin; Diphenhydramine hcl; and Latex  Home Medications   Current Outpatient Rx  Name  Route  Sig  Dispense  Refill  . levothyroxine (SYNTHROID, LEVOTHROID) 75 MCG tablet   Oral   Take 75 mcg by mouth daily.         Marland Kitchen omeprazole (PRILOSEC) 40 MG capsule               . albuterol (PROVENTIL HFA;VENTOLIN HFA) 108 (90 BASE) MCG/ACT inhaler   Inhalation   Inhale 1-2 puffs into the lungs every 6 (six) hours as needed for wheezing or shortness of breath.   1 Inhaler   3   . bisacodyl (DULCOLAX) 10 MG suppository   Rectal   Place 1 suppository (10 mg total) rectally as needed for constipation.   12 suppository   0   . budesonide-formoterol (SYMBICORT) 160-4.5 MCG/ACT inhaler   Inhalation   Inhale 2 puffs into the lungs 2 (two) times daily.         . Cetirizine HCl (ZYRTEC ALLERGY) 10 MG CAPS   Oral   Take 1 capsule (10 mg total) by mouth daily. X 2 weeks   30 capsule   1   . guaiFENesin-codeine 100-10 MG/5ML syrup   Oral   Take 5 mLs by mouth 3 (three) times daily as needed for cough.  120 mL   0   . Olopatadine HCl (PATADAY) 0.2 % SOLN   Ophthalmic   Apply to eye.         Bertram Gala Glycol-Propyl Glycol (SYSTANE OP)   Ophthalmic   Apply 2 drops to eye 2 (two) times daily as needed. For dry eyes         . polyethylene glycol powder (MIRALAX) powder   Oral   Take 17 g by mouth daily.   850 g   0   . ranitidine (ZANTAC) 300 MG tablet   Oral   Take 1 tablet (300 mg total) by mouth at bedtime.   30 tablet   2    BP 130/89  Pulse 87  Temp(Src) 98.8 F (37.1 C) (Oral)  Resp 18  SpO2 97% Physical Exam  Nursing note and vitals reviewed. Constitutional: She is  oriented to person, place, and time. Vital signs are normal. She appears well-developed and well-nourished. No distress.  HENT:  Head: Normocephalic and atraumatic.  Pulmonary/Chest: Effort normal. No respiratory distress.  Abdominal: Soft. Bowel sounds are normal. She exhibits no distension and no mass. There is no tenderness. There is no rebound and no guarding.  Neurological: She is alert and oriented to person, place, and time. She has normal strength. Coordination normal.  Skin: Skin is warm and dry. No rash noted. She is not diaphoretic.  Psychiatric: She has a normal mood and affect. Judgment normal.    ED Course  Procedures (including critical care time) Labs Review Labs Reviewed  POCT URINALYSIS DIP (DEVICE) - Abnormal; Notable for the following:    Ketones, ur TRACE (*)    All other components within normal limits   Imaging Review No results found.    MDM   1. Abdominal pain   2. Helicobacter pylori gastritis    This patient may be dealing with a resistant H. pyloric infection. I will refer her to GI. She has any worsening in the meantime she may return here or to the emergency department.  Switching nexium to zantac until she gets in to GI    Meds ordered this encounter  Medications  . ranitidine (ZANTAC) 300 MG tablet    Sig: Take 1 tablet (300 mg total) by mouth at bedtime.    Dispense:  30 tablet    Refill:  2    Order Specific Question:  Supervising Provider    Answer:  Clementeen Graham, Kathie Rhodes [[id number]]      Graylon Good, PA-C 05/08/13 1421

## 2013-05-08 NOTE — ED Notes (Signed)
Pt c/o intermittent lower abd pain onset 1 month Was seen here at the [facility] and dx w/constipation... Another medical provider saw her and dx her w/H Pylori  Given antibiotics that she finished Sxs include: nauseas and foul urine odor Denies: f/v/d, constipation, vag d/c, dysuria, hematuria Alert w/no signs of acute distress.

## 2013-05-10 NOTE — ED Provider Notes (Signed)
Medical screening examination/treatment/procedure(s) were performed by a resident physician or non-physician practitioner and as the supervising physician I was immediately available for consultation/collaboration.  Clementeen Graham, MD    Rodolph Bong, MD 05/10/13 [phone]

## 2014-03-30 ENCOUNTER — Other Ambulatory Visit: Payer: Self-pay | Admitting: Obstetrics and Gynecology

## 2014-04-06 ENCOUNTER — Ambulatory Visit: Payer: Managed Care, Other (non HMO) | Admitting: Obstetrics & Gynecology

## 2014-04-12 ENCOUNTER — Emergency Department (INDEPENDENT_AMBULATORY_CARE_PROVIDER_SITE_OTHER)
Admission: EM | Admit: 2014-04-12 | Discharge: 2014-04-12 | Disposition: A | Discharge status: Home / Self Care | Payer: Managed Care, Other (non HMO) | Source: Home / Self Care | Attending: Family Medicine | Admitting: Family Medicine

## 2014-04-12 ENCOUNTER — Encounter (HOSPITAL_COMMUNITY): Payer: Self-pay | Admitting: Emergency Medicine

## 2014-04-12 DIAGNOSIS — S239XXA Sprain of unspecified parts of thorax, initial encounter: Secondary | ICD-10-CM

## 2014-04-12 DIAGNOSIS — S29012A Strain of muscle and tendon of back wall of thorax, initial encounter: Secondary | ICD-10-CM

## 2014-04-12 DIAGNOSIS — X58XXXA Exposure to other specified factors, initial encounter: Secondary | ICD-10-CM

## 2014-04-12 MED ORDER — CYCLOBENZAPRINE HCL 5 MG PO TABS: 5.0000 mg | ORAL_TABLET | Freq: Three times a day (TID) | ORAL | Status: DC | PRN

## 2014-04-12 MED ORDER — TRAMADOL HCL 50 MG PO TABS: 50.0000 mg | ORAL_TABLET | Freq: Four times a day (QID) | ORAL | Status: DC | PRN

## 2014-04-12 NOTE — ED Notes (Signed)
Pt        Reports            Pain  Neck      l  Shoulder   And       Upper  Back            With      Symptoms      Since yest        -  Pt  Sitting  Upright  On  Exam table         Reports  Pain  Is  Worse  On  Movement         And  posistion         She  Is     Sitting  Upright  On  The  Exam table  She  Reports  Pain  On movement  And  Positions

## 2014-04-12 NOTE — Discharge Instructions (Signed)
Heat stretch and medicines as prescribed, return as needed.

## 2014-04-12 NOTE — ED Provider Notes (Signed)
CSN: [id number]     Arrival date & time 04/12/14  0846 History   First MD Initiated Contact with Patient 04/12/14 [phone]     Chief Complaint  Patient presents with  . Back Pain   (Consider location/radiation/quality/duration/timing/severity/associated sxs/prior Treatment) Patient is a 43 y.o. female presenting with back pain. The history is provided by the patient.  Back Pain Location:  Thoracic spine Quality:  Aching and shooting Radiates to:  Does not radiate Pain severity:  Mild Onset quality:  Sudden Duration:  1 day Progression:  Unchanged Chronicity:  New Context: lifting heavy objects   Context comment:  Lifting tires at work. Relieved by:  None tried Associated symptoms: no abdominal pain, no abdominal swelling, no chest pain, no fever, no numbness and no tingling     Past Medical History  Diagnosis Date  . Asthma   . Thyroid disease    Past Surgical History  Procedure Laterality Date  . Abdominal hysterectomy     History reviewed. No pertinent family history. History  Substance Use Topics  . Smoking status: Never Smoker   . Smokeless tobacco: Not on file  . Alcohol Use: Yes     Comment: occassionally   OB History   Grav Para Term Preterm Abortions TAB SAB Ect Mult Living                 Review of Systems  Constitutional: Negative.  Negative for fever.  Cardiovascular: Negative for chest pain.  Gastrointestinal: Negative for abdominal pain.  Musculoskeletal: Positive for back pain, myalgias and neck pain. Negative for gait problem, joint swelling and neck stiffness.  Skin: Negative.   Neurological: Negative for tingling and numbness.    Allergies  Other; Aspirin; Diphenhydramine hcl; and Latex  Home Medications   Prior to Admission medications   Medication Sig Start Date End Date Taking? Authorizing Provider  albuterol (PROVENTIL HFA;VENTOLIN HFA) 108 (90 BASE) MCG/ACT inhaler Inhale 1-2 puffs into the lungs every 6 (six) hours as needed for  wheezing or shortness of breath. 01/29/13   Rosana Hoes, MD  bisacodyl (DULCOLAX) 10 MG suppository Place 1 suppository (10 mg total) rectally as needed for constipation. 02/07/13   Gregor Hams, MD  budesonide-formoterol [facility]) 160-4.5 MCG/ACT inhaler Inhale 2 puffs into the lungs 2 (two) times daily.    Historical Provider, MD  Cetirizine HCl (ZYRTEC ALLERGY) 10 MG CAPS Take 1 capsule (10 mg total) by mouth daily. X 2 weeks 01/29/13   Rosana Hoes, MD  cyclobenzaprine (FLEXERIL) 5 MG tablet Take 1 tablet (5 mg total) by mouth 3 (three) times daily as needed for muscle spasms. 04/12/14   Billy Fischer, MD  guaiFENesin-codeine 100-10 MG/5ML syrup Take 5 mLs by mouth 3 (three) times daily as needed for cough. 02/07/13   Gregor Hams, MD  levothyroxine (SYNTHROID, LEVOTHROID) 75 MCG tablet Take 75 mcg by mouth daily.    Historical Provider, MD  Olopatadine HCl (PATADAY) 0.2 % SOLN Apply to eye.    Historical Provider, MD  omeprazole (PRILOSEC) 40 MG capsule  11/16/12   Historical Provider, MD  Polyethyl Glycol-Propyl Glycol (SYSTANE OP) Apply 2 drops to eye 2 (two) times daily as needed. For dry eyes    Historical Provider, MD  polyethylene glycol powder (MIRALAX) powder Take 17 g by mouth daily. 02/07/13   Gregor Hams, MD  ranitidine (ZANTAC) 300 MG tablet Take 1 tablet (300 mg total) by mouth at bedtime. 05/08/13   Liam Graham, PA-C  traMADol (  ULTRAM) 50 MG tablet Take 1 tablet (50 mg total) by mouth every 6 (six) hours as needed. For back pain 04/12/14   Billy Fischer, MD   BP 126/90  Pulse 74  Temp(Src) 98.2 F (36.8 C) (Oral)  Resp 16  SpO2 98% Physical Exam  Nursing note and vitals reviewed. Constitutional: She is oriented to person, place, and time. She appears well-developed and well-nourished. No distress.  Cardiovascular: Normal heart sounds and intact distal pulses.   Pulmonary/Chest: Breath sounds normal.  Abdominal: Soft. Bowel sounds are normal. There is no tenderness.    Musculoskeletal: She exhibits tenderness.       Back:  Neurological: She is alert and oriented to person, place, and time. No cranial nerve deficit.  Skin: Skin is warm and dry.    ED Course  Procedures (including critical care time) Labs Review Labs Reviewed - No data to display  Imaging Review No results found.   MDM   1. Muscle strain of left upper back, initial encounter        Billy Fischer, MD 04/12/14 [phone]

## 2014-04-20 ENCOUNTER — Other Ambulatory Visit: Payer: Self-pay

## 2014-04-20 DIAGNOSIS — Z1231 Encounter for screening mammogram for malignant neoplasm of breast: Secondary | ICD-10-CM

## 2014-05-04 ENCOUNTER — Ambulatory Visit
Admission: RE | Admit: 2014-05-04 | Discharge: 2014-05-04 | Disposition: A | Discharge status: Home / Self Care | Payer: Managed Care, Other (non HMO) | Source: Ambulatory Visit

## 2014-05-04 DIAGNOSIS — Z1231 Encounter for screening mammogram for malignant neoplasm of breast: Secondary | ICD-10-CM

## 2014-05-13 ENCOUNTER — Encounter (HOSPITAL_COMMUNITY): Payer: Self-pay | Admitting: Emergency Medicine

## 2014-05-13 ENCOUNTER — Emergency Department (HOSPITAL_COMMUNITY)
Admission: EM | Admit: 2014-05-13 | Discharge: 2014-05-13 | Disposition: A | Discharge status: Home / Self Care | Payer: Managed Care, Other (non HMO) | Source: Home / Self Care

## 2014-05-13 DIAGNOSIS — I10 Essential (primary) hypertension: Secondary | ICD-10-CM

## 2014-05-13 DIAGNOSIS — K219 Gastro-esophageal reflux disease without esophagitis: Secondary | ICD-10-CM

## 2014-05-13 DIAGNOSIS — R1013 Epigastric pain: Secondary | ICD-10-CM

## 2014-05-13 DIAGNOSIS — E079 Disorder of thyroid, unspecified: Secondary | ICD-10-CM

## 2014-05-13 DIAGNOSIS — R110 Nausea: Secondary | ICD-10-CM

## 2014-05-13 HISTORY — DX: Essential (primary) hypertension: I10

## 2014-05-13 LAB — COMPREHENSIVE METABOLIC PANEL
ALT: 13 U/L (ref 0–35)
AST: 14 U/L (ref 0–37)
Albumin: 4 g/dL (ref 3.5–5.2)
Alkaline Phosphatase: 84 U/L (ref 39–117)
Anion gap: 12 (ref 5–15)
BUN: 10 mg/dL (ref 6–23)
CALCIUM: 9.3 mg/dL (ref 8.4–10.5)
CO2: 25 meq/L (ref 19–32)
CREATININE: 0.76 mg/dL (ref 0.50–1.10)
Chloride: 103 mEq/L (ref 96–112)
GLUCOSE: 91 mg/dL (ref 70–99)
Potassium: 4.1 mEq/L (ref 3.7–5.3)
Sodium: 140 mEq/L (ref 137–147)
TOTAL PROTEIN: 7.9 g/dL (ref 6.0–8.3)
Total Bilirubin: 0.2 mg/dL — ABNORMAL LOW (ref 0.3–1.2)

## 2014-05-13 LAB — CBC
HCT: 40.4 % (ref 36.0–46.0)
HEMOGLOBIN: 13.3 g/dL (ref 12.0–15.0)
MCH: 29.7 pg (ref 26.0–34.0)
MCHC: 32.9 g/dL (ref 30.0–36.0)
MCV: 90.2 fL (ref 78.0–100.0)
PLATELETS: 316 10*3/uL (ref 150–400)
RBC: 4.48 MIL/uL (ref 3.87–5.11)
RDW: 12.8 % (ref 11.5–15.5)
WBC: 4.4 10*3/uL (ref 4.0–10.5)

## 2014-05-13 LAB — CLOSTRIDIUM DIFFICILE BY PCR: Toxigenic C. Difficile by PCR: NEGATIVE

## 2014-05-13 LAB — TSH: TSH: 1.34 u[IU]/mL (ref 0.350–4.500)

## 2014-05-13 MED ORDER — ONDANSETRON HCL 4 MG PO TABS: 4.0000 mg | ORAL_TABLET | Freq: Three times a day (TID) | ORAL | Status: DC | PRN

## 2014-05-13 MED ORDER — ESOMEPRAZOLE MAGNESIUM 40 MG PO PACK: 40.0000 mg | PACK | Freq: Every day | ORAL | Status: DC

## 2014-05-13 NOTE — ED Notes (Signed)
Patient c/o loose, watery stools x 1 month. Patient reports hx of h.pylori last year. Patient states does eat a lot of raw meats in her sushi. Denies N/V. NAD.

## 2014-05-13 NOTE — ED Provider Notes (Signed)
CSN: [id number]     Arrival date & time 05/13/14  0938 History   None    Chief Complaint  Patient presents with  . Diarrhea   (Consider location/radiation/quality/duration/timing/severity/associated sxs/prior Treatment) HPI  ABD pain: on and off since Dx w/ H[pylori. Dx 15 mo ago and given tripple therapy w/ resolution of pain but returned 6 wks ago. Associated w/ bloating and in the epigastric region. Worse w/ meals. Nexium w/ some benefit. Denies CP, SOB, palpitations, unintentional wt loss. Occasional heart burn. Denies insect bites, recent travel.   Diarrhea: started a few days ago. Non-bloody, flecks of mucus. Initially started out w/ thin soft, QOD BM for past 3 wks. Normal stool color.   ETOH: rare ETOH ingetsion  HTN: Denies CP, palpitations, SOB. Not on medications. Typically nml  THyroid: stopped thyroid medicine. Has not felt normal since that time   Past Medical History  Diagnosis Date  . Asthma   . Thyroid disease   . Hypertension    Past Surgical History  Procedure Laterality Date  . Abdominal hysterectomy     No family history on file. History  Substance Use Topics  . Smoking status: Never Smoker   . Smokeless tobacco: Not on file  . Alcohol Use: Yes     Comment: occassionally   OB History   Grav Para Term Preterm Abortions TAB SAB Ect Mult Living                 Review of Systems  Allergies  Other; Aspirin; Diphenhydramine hcl; and Latex  Home Medications   Prior to Admission medications   Medication Sig Start Date End Date Taking? Authorizing Provider  albuterol (PROVENTIL HFA;VENTOLIN HFA) 108 (90 BASE) MCG/ACT inhaler Inhale 1-2 puffs into the lungs every 6 (six) hours as needed for wheezing or shortness of breath. 01/29/13   Rosana Hoes, MD  bisacodyl (DULCOLAX) 10 MG suppository Place 1 suppository (10 mg total) rectally as needed for constipation. 02/07/13   Gregor Hams, MD  budesonide-formoterol [facility]) 160-4.5 MCG/ACT inhaler Inhale 2  puffs into the lungs 2 (two) times daily.    Historical Provider, MD  Cetirizine HCl (ZYRTEC ALLERGY) 10 MG CAPS Take 1 capsule (10 mg total) by mouth daily. X 2 weeks 01/29/13   Rosana Hoes, MD  cyclobenzaprine (FLEXERIL) 5 MG tablet Take 1 tablet (5 mg total) by mouth 3 (three) times daily as needed for muscle spasms. 04/12/14   Billy Fischer, MD  esomeprazole (NEXIUM) 40 MG packet Take 40 mg by mouth daily before breakfast. 05/13/14   Waldemar Dickens, MD  guaiFENesin-codeine 100-10 MG/5ML syrup Take 5 mLs by mouth 3 (three) times daily as needed for cough. 02/07/13   Gregor Hams, MD  levothyroxine (SYNTHROID, LEVOTHROID) 75 MCG tablet Take 75 mcg by mouth daily.    Historical Provider, MD  Olopatadine HCl (PATADAY) 0.2 % SOLN Apply to eye.    Historical Provider, MD  omeprazole (PRILOSEC) 40 MG capsule  11/16/12   Historical Provider, MD  ondansetron (ZOFRAN) 4 MG tablet Take 1 tablet (4 mg total) by mouth every 8 (eight) hours as needed for nausea or vomiting. 05/13/14   Waldemar Dickens, MD  Polyethyl Glycol-Propyl Glycol (SYSTANE OP) Apply 2 drops to eye 2 (two) times daily as needed. For dry eyes    Historical Provider, MD  polyethylene glycol powder (MIRALAX) powder Take 17 g by mouth daily. 02/07/13   Gregor Hams, MD  ranitidine (ZANTAC) 300 MG tablet Take  1 tablet (300 mg total) by mouth at bedtime. 05/08/13   Liam Graham, PA-C  traMADol (ULTRAM) 50 MG tablet Take 1 tablet (50 mg total) by mouth every 6 (six) hours as needed. For back pain 04/12/14   Billy Fischer, MD   BP 147/110  Pulse 75  Temp(Src) 97.9 F (36.6 C) (Oral)  Resp 16  SpO2 98% Physical Exam  Constitutional: She is oriented to person, place, and time. She appears well-developed and well-nourished. No distress.  HENT:  Head: Normocephalic and atraumatic.  Eyes: EOM are normal. Pupils are equal, round, and reactive to light.  Neck: Normal range of motion.  Cardiovascular: Normal rate, normal heart sounds and intact  distal pulses.   Pulmonary/Chest: Breath sounds normal. No respiratory distress. She has no wheezes. She has no rales. She exhibits no tenderness.  Abdominal: Soft. Bowel sounds are normal. She exhibits no distension and no mass. There is no tenderness. There is no rebound and no guarding.  Musculoskeletal: Normal range of motion. She exhibits no edema and no tenderness.  Neurological: She is alert and oriented to person, place, and time. No cranial nerve deficit. She exhibits normal muscle tone. Coordination normal.  Skin: Skin is warm. No rash noted. She is not diaphoretic. No erythema.  Psychiatric: She has a normal mood and affect. Judgment and thought content normal.    ED Course  Procedures (including critical care time) Labs Review Labs Reviewed  STOOL CULTURE  CLOSTRIDIUM DIFFICILE BY PCR  HELICOBACTER PYLORI ANTIGEN DET, STOOL  CBC  COMPREHENSIVE METABOLIC PANEL  TSH    Imaging Review No results found.   MDM   1. Epigastric pain   2. Nausea   3. Gastroesophageal reflux disease without esophagitis   4. Essential hypertension   5. Thyroid dysfunction    Etiology unclear. Concern for infectious (bacterial vs viral), unlikely autoimmune or malignancy, reflux.  - Stool culture, Hpylori stool Ag, Cdiff, O&P - CBC, CMET today  Thyroid dysfunction. Stopped leothyroxine 4 wks ago. Has not felt right since that time. TSH today. PT TO F/U W/ PCP CONCERNING RESULT. WE WILL NOT BE PRESCRIBING MEDS  HTN: f/u w/ pcp. No change a this time.   Precautions given and all questions answered  Linna Darner, MD Family Medicine 05/13/2014, 11:30 AM    Waldemar Dickens, MD 05/13/14 1130

## 2014-05-13 NOTE — Discharge Instructions (Signed)
The cause of your abdominal pain is not clear We will check it for possible infections Please start the daily nexium and zofran as needed for nausea Please start a daily probiotic, yogurt, to help with GI bacterial replenishment Please follow up with your regular doctor or a GI specialist if everything comes back abnormal

## 2014-05-13 NOTE — ED Notes (Signed)
Vital  Signs  Entered   On this chart at 0924   Entered  In  error

## 2014-05-17 LAB — STOOL CULTURE

## 2014-05-18 ENCOUNTER — Telehealth (HOSPITAL_COMMUNITY): Payer: Self-pay | Admitting: *Deleted

## 2014-05-18 ENCOUNTER — Emergency Department (INDEPENDENT_AMBULATORY_CARE_PROVIDER_SITE_OTHER)
Admission: EM | Admit: 2014-05-18 | Discharge: 2014-05-18 | Disposition: A | Discharge status: Home / Self Care | Payer: Managed Care, Other (non HMO) | Source: Home / Self Care

## 2014-05-18 ENCOUNTER — Encounter (HOSPITAL_COMMUNITY): Payer: Self-pay | Admitting: Emergency Medicine

## 2014-05-18 DIAGNOSIS — R110 Nausea: Secondary | ICD-10-CM

## 2014-05-18 DIAGNOSIS — K219 Gastro-esophageal reflux disease without esophagitis: Secondary | ICD-10-CM

## 2014-05-18 DIAGNOSIS — R1013 Epigastric pain: Secondary | ICD-10-CM

## 2014-05-18 MED ORDER — AMOXICILL-CLARITHRO-LANSOPRAZ PO MISC
Freq: Two times a day (BID) | ORAL | Status: DC
Start: 2014-05-18 — End: 2014-06-07

## 2014-05-18 NOTE — ED Notes (Signed)
Pt. is here for lab only- to get H.Pylori drawn because her previous test for H. Pylori stool Antigen was cancelled.  I had called the main lab and they said it was never ordered.  I called [facility] and they said there was only the order for stool culture.  They can't do the H. Pylori with the sample they have.

## 2014-05-18 NOTE — ED Notes (Signed)
H. Pylori is pos. per Tonia Brooms- documented incorrectly.  Dr. Juventino Slovak did Rx. for Prevpac.

## 2014-05-18 NOTE — ED Notes (Addendum)
I called pt.  Pt. verified x 2 and given neg. results. Pt. said she called Mon., Tues. and today for her results.  I explained that we don't call neg. results.   I explained that H. Pylori stool test got cancelled and I had a message to Dr. Marily Memos asking what he wanted to do. She said she is having urinary symptoms now.  I told her she would need to come back and get her urine checked. She did not have one done on 10/23 and this is a different problem.  We can ask another provider if the H. Pylori needs to be done. Pt.said she has had H. Pylori before and she is still having symptoms of it. Rebecca Soto 05/18/2014 Discussed with Dr. Juventino Slovak and he said we can to the H.Pylori as POC blood test. I called pt. Back and told her this. She does not want to wait the 2 hrs to get her urine checked. She will come get the blood test done. Rebecca Soto 05/18/2014

## 2014-05-18 NOTE — ED Notes (Signed)
Will notify Nicki Reaper and Anderson Malta that it should be a no charge for visit due to initial test being cancelled.

## 2014-05-19 LAB — POCT H PYLORI SCREEN
H. PYLORI SCREEN, POC: NEGATIVE
H. PYLORI SCREEN, POC: POSITIVE — AB

## 2014-06-01 ENCOUNTER — Encounter (HOSPITAL_COMMUNITY): Payer: Self-pay | Admitting: Emergency Medicine

## 2014-06-01 ENCOUNTER — Emergency Department (HOSPITAL_COMMUNITY)
Admission: EM | Admit: 2014-06-01 | Discharge: 2014-06-01 | Disposition: A | Discharge status: Home / Self Care | Payer: Managed Care, Other (non HMO) | Attending: Emergency Medicine | Admitting: Emergency Medicine

## 2014-06-01 ENCOUNTER — Emergency Department (HOSPITAL_COMMUNITY): Payer: Managed Care, Other (non HMO)

## 2014-06-01 DIAGNOSIS — Z79899 Other long term (current) drug therapy: Secondary | ICD-10-CM | POA: Diagnosis not present

## 2014-06-01 DIAGNOSIS — Z90710 Acquired absence of both cervix and uterus: Secondary | ICD-10-CM | POA: Diagnosis not present

## 2014-06-01 DIAGNOSIS — J45901 Unspecified asthma with (acute) exacerbation: Secondary | ICD-10-CM | POA: Insufficient documentation

## 2014-06-01 DIAGNOSIS — I10 Essential (primary) hypertension: Secondary | ICD-10-CM | POA: Insufficient documentation

## 2014-06-01 DIAGNOSIS — Z792 Long term (current) use of antibiotics: Secondary | ICD-10-CM | POA: Insufficient documentation

## 2014-06-01 DIAGNOSIS — Z91040 Latex allergy status: Secondary | ICD-10-CM | POA: Insufficient documentation

## 2014-06-01 DIAGNOSIS — Z8639 Personal history of other endocrine, nutritional and metabolic disease: Secondary | ICD-10-CM | POA: Insufficient documentation

## 2014-06-01 DIAGNOSIS — R1030 Lower abdominal pain, unspecified: Secondary | ICD-10-CM

## 2014-06-01 DIAGNOSIS — R0789 Other chest pain: Secondary | ICD-10-CM | POA: Diagnosis not present

## 2014-06-01 DIAGNOSIS — R109 Unspecified abdominal pain: Secondary | ICD-10-CM | POA: Diagnosis present

## 2014-06-01 LAB — COMPREHENSIVE METABOLIC PANEL
ALT: 14 U/L (ref 0–35)
AST: 16 U/L (ref 0–37)
Albumin: 3.6 g/dL (ref 3.5–5.2)
Alkaline Phosphatase: 87 U/L (ref 39–117)
Anion gap: 15 (ref 5–15)
BILIRUBIN TOTAL: 0.4 mg/dL (ref 0.3–1.2)
BUN: 7 mg/dL (ref 6–23)
CALCIUM: 9.2 mg/dL (ref 8.4–10.5)
CHLORIDE: 103 meq/L (ref 96–112)
CO2: 21 meq/L (ref 19–32)
CREATININE: 0.81 mg/dL (ref 0.50–1.10)
GFR, EST NON AFRICAN AMERICAN: 88 mL/min — AB (ref >90)
GLUCOSE: 82 mg/dL (ref 70–99)
Potassium: 4 mEq/L (ref 3.7–5.3)
Sodium: 139 mEq/L (ref 137–147)
Total Protein: 7.3 g/dL (ref 6.0–8.3)

## 2014-06-01 LAB — CBC WITH DIFFERENTIAL/PLATELET
Basophils Absolute: 0 10*3/uL (ref 0.0–0.1)
Basophils Relative: 1 % (ref 0–1)
EOS PCT: 6 % — AB (ref 0–5)
Eosinophils Absolute: 0.3 10*3/uL (ref 0.0–0.7)
HEMATOCRIT: 40.7 % (ref 36.0–46.0)
HEMOGLOBIN: 13.6 g/dL (ref 12.0–15.0)
LYMPHS ABS: 2.2 10*3/uL (ref 0.7–4.0)
LYMPHS PCT: 40 % (ref 12–46)
MCH: 30.1 pg (ref 26.0–34.0)
MCHC: 33.4 g/dL (ref 30.0–36.0)
MCV: 90 fL (ref 78.0–100.0)
MONO ABS: 0.6 10*3/uL (ref 0.1–1.0)
MONOS PCT: 10 % (ref 3–12)
NEUTROS ABS: 2.4 10*3/uL (ref 1.7–7.7)
Neutrophils Relative %: 43 % (ref 43–77)
Platelets: 318 10*3/uL (ref 150–400)
RBC: 4.52 MIL/uL (ref 3.87–5.11)
RDW: 12.6 % (ref 11.5–15.5)
WBC: 5.6 10*3/uL (ref 4.0–10.5)

## 2014-06-01 LAB — URINALYSIS, ROUTINE W REFLEX MICROSCOPIC
BILIRUBIN URINE: NEGATIVE
GLUCOSE, UA: NEGATIVE mg/dL
KETONES UR: NEGATIVE mg/dL
LEUKOCYTES UA: NEGATIVE
Nitrite: NEGATIVE
PH: 7 (ref 5.0–8.0)
PROTEIN: NEGATIVE mg/dL
Specific Gravity, Urine: 1.021 (ref 1.005–1.030)
Urobilinogen, UA: 0.2 mg/dL (ref 0.0–1.0)

## 2014-06-01 LAB — LIPASE, BLOOD: LIPASE: 42 U/L (ref 11–59)

## 2014-06-01 LAB — URINE MICROSCOPIC-ADD ON

## 2014-06-01 MED ORDER — ONDANSETRON 4 MG PO TBDP: ORAL_TABLET | ORAL | Status: DC

## 2014-06-01 MED ORDER — IOHEXOL 300 MG/ML  SOLN: 25.0000 mL | Freq: Once | INTRAMUSCULAR | Status: DC | PRN

## 2014-06-01 MED ORDER — ALBUTEROL SULFATE HFA 108 (90 BASE) MCG/ACT IN AERS
4.0000 | INHALATION_SPRAY | Freq: Once | RESPIRATORY_TRACT | Status: AC
  Administered 2014-06-01: 4 via RESPIRATORY_TRACT
  Filled 2014-06-01: qty 6.7

## 2014-06-01 NOTE — ED Notes (Signed)
Pt ambulated to RR with steady gait

## 2014-06-01 NOTE — ED Notes (Signed)
Heaviness in abd, has had nausea, urinating a lot at night , bms not normal but  not diarrhea has been going on for a month has hx of h pyloi and is finishing meds for that heavines in chest  X 1 1/2 weeks even tho she has been using her inhalers

## 2014-06-01 NOTE — ED Provider Notes (Signed)
CSN: [id number]     Arrival date & time 06/01/14  2025 History   First MD Initiated Contact with Patient 06/01/14 0957     Chief Complaint  Patient presents with  . Abdominal Pain     (Consider location/radiation/quality/duration/timing/severity/associated sxs/prior Treatment) Patient is a 43 y.o. female presenting with abdominal pain. The history is provided by the patient.  Abdominal Pain Pain location:  Suprapubic Pain quality: pressure   Pain radiates to:  Back Pain severity:  Mild Onset quality:  Gradual Duration:  4 weeks Timing:  Constant Progression:  Unchanged Chronicity:  New Context comment:  At night Relieved by:  Nothing Worsened by:  Nothing tried Ineffective treatments: h. pylori meds, maalox helps some. Associated symptoms: shortness of breath (intermittent, c/w asthma)   Associated symptoms: no chest pain, no cough, no diarrhea, no dysuria, no fatigue, no fever, no hematuria, no nausea and no vomiting     Past Medical History  Diagnosis Date  . Asthma   . Thyroid disease   . Hypertension    Past Surgical History  Procedure Laterality Date  . Abdominal hysterectomy     Family History  Problem Relation Age of Onset  . Cancer Other   . Hypertension Other    History  Substance Use Topics  . Smoking status: Never Smoker   . Smokeless tobacco: Not on file  . Alcohol Use: Yes     Comment: occassionally   OB History    No data available     Review of Systems  Constitutional: Negative for fever and fatigue.  HENT: Negative for congestion and drooling.   Eyes: Negative for pain.  Respiratory: Positive for chest tightness (occasional, c/w asthma) and shortness of breath (intermittent, c/w asthma). Negative for cough.   Cardiovascular: Negative for chest pain.  Gastrointestinal: Negative for nausea, vomiting, abdominal pain and diarrhea.  Genitourinary: Negative for dysuria and hematuria.  Musculoskeletal: Negative for back pain, gait problem and  neck pain.  Skin: Negative for color change.  Neurological: Negative for dizziness and headaches.  Hematological: Negative for adenopathy.  Psychiatric/Behavioral: Negative for behavioral problems.  All other systems reviewed and are negative.     Allergies  Other; Aspirin; Diphenhydramine hcl; and Latex  Home Medications   Prior to Admission medications   Medication Sig Start Date End Date Taking? Authorizing Provider  amoxicillin-clarithromycin-lansoprazole [facility]) combo pack Take by mouth 2 (two) times daily. Follow package directions. 05/18/14  Yes Billy Fischer, MD  budesonide-formoterol [facility]) 160-4.5 MCG/ACT inhaler Inhale 2 puffs into the lungs 2 (two) times daily.   Yes Historical Provider, MD  Cetirizine HCl (ZYRTEC ALLERGY) 10 MG CAPS Take 1 capsule (10 mg total) by mouth daily. X 2 weeks 01/29/13  Yes Rosana Hoes, MD  Fluticasone Propionate  HFA (FLOVENT HFA IN) Inhale 2 puffs into the lungs daily as needed (Shortness of btreath).   Yes Historical Provider, MD  omeprazole (PRILOSEC) 40 MG capsule Take 40 mg by mouth daily.  11/16/12  Yes Historical Provider, MD  Polyethyl Glycol-Propyl Glycol (SYSTANE OP) Apply 2 drops to eye 2 (two) times daily as needed. For dry eyes   Yes Historical Provider, MD  Probiotic Product (PROBIOTIC DAILY PO) Take 1 capsule by mouth 2 (two) times daily.   Yes Historical Provider, MD  albuterol (PROVENTIL HFA;VENTOLIN HFA) 108 (90 BASE) MCG/ACT inhaler Inhale 1-2 puffs into the lungs every 6 (six) hours as needed for wheezing or shortness of breath. Patient not taking: Reported on 06/01/2014 01/29/13  Rosana Hoes, MD  bisacodyl (DULCOLAX) 10 MG suppository Place 1 suppository (10 mg total) rectally as needed for constipation. Patient not taking: Reported on 06/01/2014 02/07/13   Gregor Hams, MD  cyclobenzaprine (FLEXERIL) 5 MG tablet Take 1 tablet (5 mg total) by mouth 3 (three) times daily as needed for muscle spasms. Patient not taking:  Reported on 06/01/2014 04/12/14   Billy Fischer, MD  esomeprazole (NEXIUM) 40 MG packet Take 40 mg by mouth daily before breakfast. Patient not taking: Reported on 06/01/2014 05/13/14   Waldemar Dickens, MD  guaiFENesin-codeine 100-10 MG/5ML syrup Take 5 mLs by mouth 3 (three) times daily as needed for cough. Patient not taking: Reported on 06/01/2014 02/07/13   Gregor Hams, MD  ondansetron (ZOFRAN) 4 MG tablet Take 1 tablet (4 mg total) by mouth every 8 (eight) hours as needed for nausea or vomiting. Patient not taking: Reported on 06/01/2014 05/13/14   Waldemar Dickens, MD  polyethylene glycol powder [facility]) powder Take 17 g by mouth daily. Patient not taking: Reported on 06/01/2014 02/07/13   Gregor Hams, MD  ranitidine (ZANTAC) 300 MG tablet Take 1 tablet (300 mg total) by mouth at bedtime. Patient not taking: Reported on 06/01/2014 05/08/13   Liam Graham, PA-C  traMADol (ULTRAM) 50 MG tablet Take 1 tablet (50 mg total) by mouth every 6 (six) hours as needed. For back pain Patient not taking: Reported on 06/01/2014 04/12/14   Billy Fischer, MD   BP 135/85 mmHg  Pulse 81  Temp(Src) 98.6 F (37 C) (Oral)  Resp 18  SpO2 98% Physical Exam  Constitutional: She is oriented to person, place, and time. She appears well-developed and well-nourished.  HENT:  Head: Normocephalic and atraumatic.  Mouth/Throat: Oropharynx is clear and moist. No oropharyngeal exudate.  Eyes: Conjunctivae and EOM are normal. Pupils are equal, round, and reactive to light.  Neck: Normal range of motion. Neck supple.  Cardiovascular: Normal rate, regular rhythm, normal heart sounds and intact distal pulses.  Exam reveals no gallop and no friction rub.   No murmur heard. Pulmonary/Chest: Effort normal. No respiratory distress. She has wheezes (faint expiratory wheeze heard in the right lung base.).  Abdominal: Soft. Bowel sounds are normal. There is no tenderness. There is no rebound and no guarding.   Musculoskeletal: Normal range of motion. She exhibits no edema or tenderness.  Neurological: She is alert and oriented to person, place, and time.  Skin: Skin is warm and dry.  Psychiatric: She has a normal mood and affect. Her behavior is normal.  Nursing note and vitals reviewed.   ED Course  Procedures (including critical care time) Labs Review Labs Reviewed  CBC WITH DIFFERENTIAL - Abnormal; Notable for the following:    Eosinophils Relative 6 (*)    All other components within normal limits  COMPREHENSIVE METABOLIC PANEL - Abnormal; Notable for the following:    GFR calc non Af Amer 88 (*)    All other components within normal limits  URINALYSIS, ROUTINE W REFLEX MICROSCOPIC - Abnormal; Notable for the following:    Hgb urine dipstick SMALL (*)    All other components within normal limits  LIPASE, BLOOD  URINE MICROSCOPIC-ADD ON    Imaging Review Ct Abdomen Pelvis Wo Contrast  06/01/2014   CLINICAL DATA:  Lower abdominal and pelvic pain with nausea  EXAM: CT ABDOMEN AND PELVIS WITHOUT CONTRAST  TECHNIQUE: Multidetector CT imaging of the abdomen and pelvis was performed following the standard protocol  without IV contrast.  COMPARISON:  12/28/2012.  FINDINGS: The lung bases are free of acute infiltrate or sizable effusion.  The liver, gallbladder, spleen, adrenal glands and pancreas are within normal limits. A tiny 1-2 mm nonobstructing right renal stone is noted. Hypodensities are noted within both kidneys largest of which is noted on the left measuring 2.3 cm likely representing small cysts. No obstructive changes are seen.  The appendix is air-filled. The bladder is partially distended. The uterus has been surgically removed. Cystic changes noted within the right ovary stable from the prior exam. No pelvic mass lesion or sidewall abnormality is noted. The osseous structures are within normal limits.  IMPRESSION: Nonobstructing right renal stone.  Hypodensities within the kidneys  likely representing cysts.  Chronic right ovarian cystic change.  No acute abnormality is noted.   Electronically Signed   By: Inez Catalina M.D.   On: 06/01/2014 14:52     EKG Interpretation   Date/Time:  Wednesday June 01 2014 10:02:21 EST Ventricular Rate:  84 PR Interval:  163 QRS Duration: 87 QT Interval:  370 QTC Calculation: 437 R Axis:   21 Text Interpretation:  Sinus rhythm Probable anteroseptal infarct, old no  previous for comparison Confirmed by Apollos Tenbrink  MD, Romero Letizia ([id number]) on  06/01/2014 10:07:38 AM      MDM   Final diagnoses:  Lower abdominal pain    10:33 AM 43 y.o. female who presents with complaint of sensation of pressure in her lower abdomen and lower back for the last month. She states that her symptoms are mostly at night. She did have a recent workup in October and was found to be positive for H. Pylori. She is still taking the medication for this but is having persistent symptoms. She denies any GU symptoms like vaginal discharge or dysuria. She has 1-2 loose stools per day. She was also worked up for C. Difficile and had a negative stool culture. She denies any fevers or vomiting. She denies any pain. She states that she took some Maalox last night which helped. We'll get screening imaging and urinalysis. She also has mild sob on exam c/w asthma, will give inhaler.   3:10 PM: I interpreted/reviewed the labs and/or imaging which were non-contributory. Pt notified of incidental findings on CT. Pt continues to appear well. Denies pain, only feeling of pressure.  I have discussed the diagnosis/risks/treatment options with the patient and believe the pt to be eligible for discharge home to follow-up with her pcp. We also discussed returning to the ED immediately if new or worsening sx occur. We discussed the sx which are most concerning (e.g., worsening pain, fever) that necessitate immediate return. Medications administered to the patient during their visit and any  new prescriptions provided to the patient are listed below.  Medications given during this visit Medications  albuterol (PROVENTIL HFA;VENTOLIN HFA) 108 (90 BASE) MCG/ACT inhaler 4 puff (4 puffs Inhalation Given 06/01/14 1045)    New Prescriptions   ONDANSETRON (ZOFRAN ODT) 4 MG DISINTEGRATING TABLET    4mg  ODT q4 hours prn nausea/vomit     Pamella Pert, MD 06/01/14 1515

## 2014-06-01 NOTE — Discharge Instructions (Signed)
Abdominal Pain, Women °Abdominal (stomach, pelvic, or belly) pain can be caused by many things. It is important to tell your doctor: °· The location of the pain. °· Does it come and go or is it present all the time? °· Are there things that start the pain (eating certain foods, exercise)? °· Are there other symptoms associated with the pain (fever, nausea, vomiting, diarrhea)? °All of this is helpful to know when trying to find the cause of the pain. °CAUSES  °· Stomach: virus or bacteria infection, or ulcer. °· Intestine: appendicitis (inflamed appendix), regional ileitis (Crohn's disease), ulcerative colitis (inflamed colon), irritable bowel syndrome, diverticulitis (inflamed diverticulum of the colon), or cancer of the stomach or intestine. °· Gallbladder disease or stones in the gallbladder. °· Kidney disease, kidney stones, or infection. °· Pancreas infection or cancer. °· Fibromyalgia (pain disorder). °· Diseases of the female organs: °¨ Uterus: fibroid (non-cancerous) tumors or infection. °¨ Fallopian tubes: infection or tubal pregnancy. °¨ Ovary: cysts or tumors. °¨ Pelvic adhesions (scar tissue). °¨ Endometriosis (uterus lining tissue growing in the pelvis and on the pelvic organs). °¨ Pelvic congestion syndrome (female organs filling up with blood just before the menstrual period). °¨ Pain with the menstrual period. °¨ Pain with ovulation (producing an egg). °¨ Pain with an IUD (intrauterine device, birth control) in the uterus. °¨ Cancer of the female organs. °· Functional pain (pain not caused by a disease, may improve without treatment). °· Psychological pain. °· Depression. °DIAGNOSIS  °Your doctor will decide the seriousness of your pain by doing an examination. °· Blood tests. °· X-rays. °· Ultrasound. °· CT scan (computed tomography, special type of X-ray). °· MRI (magnetic resonance imaging). °· Cultures, for infection. °· Barium enema (dye inserted in the large intestine, to better view it with  X-rays). °· Colonoscopy (looking in intestine with a lighted tube). °· Laparoscopy (minor surgery, looking in abdomen with a lighted tube). °· Major abdominal exploratory surgery (looking in abdomen with a large incision). °TREATMENT  °The treatment will depend on the cause of the pain.  °· Many cases can be observed and treated at home. °· Over-the-counter medicines recommended by your caregiver. °· Prescription medicine. °· Antibiotics, for infection. °· Birth control pills, for painful periods or for ovulation pain. °· Hormone treatment, for endometriosis. °· Nerve blocking injections. °· Physical therapy. °· Antidepressants. °· Counseling with a psychologist or psychiatrist. °· Minor or major surgery. °HOME CARE INSTRUCTIONS  °· Do not take laxatives, unless directed by your caregiver. °· Take over-the-counter pain medicine only if ordered by your caregiver. Do not take aspirin because it can cause an upset stomach or bleeding. °· Try a clear liquid diet (broth or water) as ordered by your caregiver. Slowly move to a bland diet, as tolerated, if the pain is related to the stomach or intestine. °· Have a thermometer and take your temperature several times a day, and record it. °· Bed rest and sleep, if it helps the pain. °· Avoid sexual intercourse, if it causes pain. °· Avoid stressful situations. °· Keep your follow-up appointments and tests, as your caregiver orders. °· If the pain does not go away with medicine or surgery, you may try: °¨ Acupuncture. °¨ Relaxation exercises (yoga, meditation). °¨ Group therapy. °¨ Counseling. °SEEK MEDICAL CARE IF:  °· You notice certain foods cause stomach pain. °· Your home care treatment is not helping your pain. °· You need stronger pain medicine. °· You want your IUD removed. °· You feel faint or   lightheaded. °· You develop nausea and vomiting. °· You develop a rash. °· You are having side effects or an allergy to your medicine. °SEEK IMMEDIATE MEDICAL CARE IF:  °· Your  pain does not go away or gets worse. °· You have a fever. °· Your pain is felt only in portions of the abdomen. The right side could possibly be appendicitis. The left lower portion of the abdomen could be colitis or diverticulitis. °· You are passing blood in your stools (bright red or black tarry stools, with or without vomiting). °· You have blood in your urine. °· You develop chills, with or without a fever. °· You pass out. °MAKE SURE YOU:  °· Understand these instructions. °· Will watch your condition. °· Will get help right away if you are not doing well or get worse. °Document Released: 05/05/2007 Document Revised: 11/22/2013 Document Reviewed: 05/25/2009 °[employer]® Patient Information ©2015 [employer]. This information is not intended to replace advice given to you by your health care provider. Make sure you discuss any questions you have with your health care provider. ° °

## 2014-06-07 ENCOUNTER — Encounter: Payer: Self-pay | Admitting: Medical

## 2014-06-07 ENCOUNTER — Telehealth: Payer: Self-pay | Admitting: Medical

## 2014-06-07 ENCOUNTER — Ambulatory Visit (INDEPENDENT_AMBULATORY_CARE_PROVIDER_SITE_OTHER): Payer: Managed Care, Other (non HMO) | Admitting: Medical

## 2014-06-07 VITALS — BP 112/80 | HR 78 | Temp 98.1°F | Resp 16 | Ht 65.0 in | Wt 240.0 lb

## 2014-06-07 DIAGNOSIS — K219 Gastro-esophageal reflux disease without esophagitis: Secondary | ICD-10-CM

## 2014-06-07 DIAGNOSIS — B379 Candidiasis, unspecified: Secondary | ICD-10-CM

## 2014-06-07 DIAGNOSIS — R110 Nausea: Secondary | ICD-10-CM

## 2014-06-07 DIAGNOSIS — Z87440 Personal history of urinary (tract) infections: Secondary | ICD-10-CM

## 2014-06-07 DIAGNOSIS — R1030 Lower abdominal pain, unspecified: Secondary | ICD-10-CM

## 2014-06-07 DIAGNOSIS — M62830 Muscle spasm of back: Secondary | ICD-10-CM

## 2014-06-07 LAB — POCT URINALYSIS DIPSTICK
Bilirubin, UA: NEGATIVE
Glucose, UA: NEGATIVE
Ketones, UA: NEGATIVE
Nitrite, UA: NEGATIVE
Protein, UA: NEGATIVE
UROBILINOGEN UA: NEGATIVE
pH, UA: 6

## 2014-06-07 MED ORDER — ONDANSETRON HCL 4 MG PO TABS: 4.0000 mg | ORAL_TABLET | Freq: Three times a day (TID) | ORAL | Status: DC | PRN

## 2014-06-07 MED ORDER — CYCLOBENZAPRINE HCL 10 MG PO TABS: 10.0000 mg | ORAL_TABLET | Freq: Every day | ORAL | Status: DC

## 2014-06-07 MED ORDER — ESOMEPRAZOLE MAGNESIUM 40 MG PO CPDR: 40.0000 mg | DELAYED_RELEASE_CAPSULE | Freq: Every day | ORAL | Status: DC

## 2014-06-07 NOTE — Telephone Encounter (Signed)
Records received from former provider. Pt has an appt today. Sending back for review when pt comes in.

## 2014-06-07 NOTE — Progress Notes (Signed)
Subjective: Here as a new patient today for hospital f/u.  Has been seen twice recenlty, once for upper abdominal pain, once for lower abdominal pain.  In September diagnosis with H pylori, was put on triple therapy which she has taken.  although upper belly pain has improved, still getting some reflux, nausea.  Was also given diflucan as she is prone to yeast infection in general but certainly after taking antibiotics  She went back to the ED in October for lower abdominal pain.  Was advised to f/u here.    She notes hx/o recurrent UTI, recurrent yeast infections, and hx/o BV.   Has been treated 3 x this year for UTI. In general she reports intermittent lower abdominal pain, flank pain at times, urine odor at times, cloudy urine at times, intermittent nausea.   Denies dysuria, no hematuria, no fever, no chills.   She also notes some back pain, worse if moving over in bed, moving certain ways, relieved by rest.   No concern for STD, same sexual partner for long time, no current vaginal itching, dryness, redness, discharge or odor.   No family hx/o early menopause.  No other aggravating or relieving factors. No other complaint.  ROS as in subjective  Past Medical History  Diagnosis Date  . Asthma   . Goiter    Objective: Filed Vitals:   06/07/14 1358  BP: 112/80  Pulse: 78  Temp: 98.1 F (36.7 C)  Resp: 16    General appearance: alert, no distress, WD/WN, AA female  Heart: RRR, normal S1, S2, no murmurs Lungs: CTA bilaterally, no wheezes, rhonchi, or rales Abdomen: +bs, soft, mild generalized lower tenderness, otherwise non tender, non distended, no masses, no hepatomegaly, no splenomegaly Pulses: 2+ symmetric, upper and lower extremities, normal cap refill Back: nontender, normal ROM Ext: no edema   Assessment: Encounter Diagnoses  Name Primary?  . Abdominal pain, lower Yes  . Nausea without vomiting   . History of recurrent UTI (urinary tract infection)   . Yeast infection   .  Gastroesophageal reflux disease without esophagitis   . Muscle spasm of back      Plan: Reviewed recent ED reports, labs, CT abdomen and pelvis.     Nausea likely related to GERD - Refilled Zofran for prn use for nausea, begin back on Nexium, discussed proper use, avoidance of GERD triggers.     Recurrent UTI- Will send urine for culture today.   She declines pelvis exam today, but if not improving may need for there exam and testing.  Discussed general preventative measures regarding recurrent UTI and yeast infections.    Muscle spasm of back - Flexeril prn use for back spasm, advised stretching, ROM activity.

## 2014-06-09 ENCOUNTER — Inpatient Hospital Stay: Payer: Managed Care, Other (non HMO) | Admitting: Medical

## 2014-06-09 ENCOUNTER — Other Ambulatory Visit: Payer: Self-pay | Admitting: Medical

## 2014-06-09 LAB — URINE CULTURE: Colony Count: >=100000

## 2014-06-09 MED ORDER — SULFAMETHOXAZOLE-TRIMETHOPRIM 800-160 MG PO TABS: 1.0000 | ORAL_TABLET | Freq: Two times a day (BID) | ORAL | Status: DC

## 2014-07-18 ENCOUNTER — Encounter: Payer: Self-pay | Admitting: *Deleted

## 2014-07-19 ENCOUNTER — Encounter: Payer: Self-pay | Admitting: Obstetrics & Gynecology

## 2015-05-11 ENCOUNTER — Other Ambulatory Visit: Payer: Self-pay

## 2015-05-11 DIAGNOSIS — Z1231 Encounter for screening mammogram for malignant neoplasm of breast: Secondary | ICD-10-CM

## 2015-06-02 ENCOUNTER — Ambulatory Visit
Admission: RE | Admit: 2015-06-02 | Discharge: 2015-06-02 | Disposition: A | Discharge status: Home / Self Care | Payer: Managed Care, Other (non HMO) | Source: Ambulatory Visit

## 2015-06-02 DIAGNOSIS — Z1231 Encounter for screening mammogram for malignant neoplasm of breast: Secondary | ICD-10-CM

## 2015-08-14 ENCOUNTER — Encounter: Payer: Self-pay | Admitting: *Deleted

## 2015-08-14 ENCOUNTER — Encounter: Payer: Managed Care, Other (non HMO) | Attending: Obstetrics and Gynecology | Admitting: *Deleted

## 2015-08-14 DIAGNOSIS — E669 Obesity, unspecified: Secondary | ICD-10-CM | POA: Diagnosis present

## 2015-08-14 DIAGNOSIS — R7303 Prediabetes: Secondary | ICD-10-CM | POA: Insufficient documentation

## 2015-08-14 DIAGNOSIS — Z6839 Body mass index (BMI) 39.0-39.9, adult: Secondary | ICD-10-CM | POA: Insufficient documentation

## 2015-08-14 DIAGNOSIS — Z713 Dietary counseling and surveillance: Secondary | ICD-10-CM | POA: Insufficient documentation

## 2015-08-14 NOTE — Progress Notes (Signed)
  Medical Nutrition Therapy:  Appt start time: 1630 end time:  1730.  Assessment:  Primary concerns today: Rebecca Soto is here for nutrition counseling pertaining to referral for hyperglycemia.  She states she would also like to lose weight.  She has attempted to lose weight in the past: Herbalife, exercising some, changing foods and eat "clean".  A1c is 5.7%.  Jyoti stats that her stype of cooking she learned from her family who cook with a lot of fat "soul food." and she knows that is not the healthiest way to cooks.   Does not think she gets hungry, but eats at certain times.  Fullness is evidenced by stomach discomfort.  Heaviest weight outside of pregnancy :270 lb Lowest adult weight: 180-190 lb Most consistent weight: 230-250 lb Would like to weigh : 180-200 lb  Family history of obesity, but feels like she looks like her dad, who is thinner.   Does grocery shopping with mom and shares cooking responsibilities with mom.    A variety of cooking methods are employed, but mostly bake in the oven.  Eats out seldomly: 1 every 3 weeks. When at home, she eats in her bedroom while studying or reading.  She is not a fast eater.    Preferred Learning Style:   No preference indicated   Learning Readiness:   Ready   MEDICATIONS: see list   DIETARY INTAKE:  Usual eating pattern includes 1 meals and ~1 snacks per day.   Avoided foods include none.    24-hr recall:  B ( AM): cream of wheat.  Skips usually Snk ( AM): none  L ( PM): none Snk ( PM): maybe berries or nuts D ( PM): fried chicken and cole slaw.  Usually has baked chicken and greens Snk ( PM): none Beverages: water and sometimes tea, but not often.  Sometimes coffee  Usual physical activity: none currently   Estimated energy needs: 1800-2200 calories    Nutritional Diagnosis:  NB-1.1 Food and nutrition-related knowledge deficit As related to proper balance of fats, carbohydrates, and proteins.  As evidenced by patient  self-reported knowledge deficit.    Intervention:  Nutrition counseling provided.  Discussed lab results and opportunity to improve health with lifestyle change.  Discussed metabolic effects of meal skipping and discouraged this practice.  Discussed MyPlate recommendations for meal planning.  Discussed mindful eating and regular physical activity.  Discussed HAES principles and encouraged her to focus on her health, not her weight  Goals: Aim for 3 meals each day and avoid meal skipping Follow MyPlate recommendations for meal planning: shoot for at least 3 food groups/meal  Choose more whole grains, lean meats (baked, broiled, grilled not fried), leaner vegetable cooked for shorter time, and low fat dairy Eat without distractions and stop eating when comfortable, not stuffed Aim to be active ~30 minutes 5 days/week  Teaching Method Utilized: Visual Auditory   Handouts given during visit include:  MyPlate  Barriers to learning/adherence to lifestyle change: eating 3 meals/day  Demonstrated degree of understanding via:  Teach Back   Monitoring/Evaluation:  Dietary intake, exercise, and body weight prn.  Recommend A1C in 4-8 weeks

## 2015-08-14 NOTE — Patient Instructions (Signed)
Aim for 3 meals each day and avoid meal skipping Follow MyPlate recommendations for meal planning: shoot for at least 3 food groups/meal  Choose more whole grains, lean meats (baked, broiled, grilled not fried), leaner vegetable cooked for shorter time, and low fat dairy Eat without distractions and stop eating when comfortable, not stuffed Aim to be active ~30 minutes 5 days/week

## 2015-09-11 ENCOUNTER — Ambulatory Visit: Payer: Managed Care, Other (non HMO) | Admitting: *Deleted

## 2016-05-14 ENCOUNTER — Other Ambulatory Visit: Payer: Self-pay

## 2016-05-14 DIAGNOSIS — Z1231 Encounter for screening mammogram for malignant neoplasm of breast: Secondary | ICD-10-CM

## 2016-06-07 ENCOUNTER — Encounter: Payer: Self-pay | Admitting: Medical

## 2016-06-07 ENCOUNTER — Ambulatory Visit (INDEPENDENT_AMBULATORY_CARE_PROVIDER_SITE_OTHER): Payer: Managed Care, Other (non HMO) | Admitting: Medical

## 2016-06-07 ENCOUNTER — Ambulatory Visit
Admission: RE | Admit: 2016-06-07 | Discharge: 2016-06-07 | Disposition: A | Discharge status: Home / Self Care | Payer: Managed Care, Other (non HMO) | Source: Ambulatory Visit

## 2016-06-07 VITALS — BP 132/82 | Temp 98.0°F | Wt 250.0 lb

## 2016-06-07 DIAGNOSIS — Z1231 Encounter for screening mammogram for malignant neoplasm of breast: Secondary | ICD-10-CM

## 2016-06-07 DIAGNOSIS — R109 Unspecified abdominal pain: Secondary | ICD-10-CM

## 2016-06-07 DIAGNOSIS — M545 Low back pain, unspecified: Secondary | ICD-10-CM

## 2016-06-07 DIAGNOSIS — R350 Frequency of micturition: Secondary | ICD-10-CM | POA: Diagnosis not present

## 2016-06-07 DIAGNOSIS — Z87442 Personal history of urinary calculi: Secondary | ICD-10-CM

## 2016-06-07 DIAGNOSIS — E039 Hypothyroidism, unspecified: Secondary | ICD-10-CM

## 2016-06-07 LAB — POCT URINALYSIS DIPSTICK
Bilirubin, UA: NEGATIVE
GLUCOSE UA: NEGATIVE
Ketones, UA: NEGATIVE
Leukocytes, UA: NEGATIVE
Nitrite, UA: NEGATIVE
PROTEIN UA: NEGATIVE
RBC UA: NEGATIVE
Spec Grav, UA: 1.025
Urobilinogen, UA: NEGATIVE
pH, UA: 6.5

## 2016-06-07 LAB — COMPREHENSIVE METABOLIC PANEL
ALBUMIN: 4.1 g/dL (ref 3.6–5.1)
ALT: 14 U/L (ref 6–29)
AST: 22 U/L (ref 10–35)
Alkaline Phosphatase: 80 U/L (ref 33–115)
BUN: 10 mg/dL (ref 7–25)
CO2: 23 mmol/L (ref 20–31)
CREATININE: 0.68 mg/dL (ref 0.50–1.10)
Calcium: 9.2 mg/dL (ref 8.6–10.2)
Chloride: 106 mmol/L (ref 98–110)
Glucose, Bld: 93 mg/dL (ref 65–99)
Potassium: 4.4 mmol/L (ref 3.5–5.3)
SODIUM: 139 mmol/L (ref 135–146)
Total Bilirubin: 0.3 mg/dL (ref 0.2–1.2)
Total Protein: 7.1 g/dL (ref 6.1–8.1)

## 2016-06-07 LAB — CBC WITH DIFFERENTIAL/PLATELET
BASOS PCT: 1 %
Basophils Absolute: 62 cells/uL (ref 0–200)
Eosinophils Absolute: 186 cells/uL (ref 15–500)
Eosinophils Relative: 3 %
HEMATOCRIT: 38.5 % (ref 35.0–45.0)
Hemoglobin: 13 g/dL (ref 11.7–15.5)
LYMPHS ABS: 2666 {cells}/uL (ref 850–3900)
Lymphocytes Relative: 43 %
MCH: 30.3 pg (ref 27.0–33.0)
MCHC: 33.8 g/dL (ref 32.0–36.0)
MCV: 89.7 fL (ref 80.0–100.0)
MPV: 9.3 fL (ref 7.5–12.5)
Monocytes Absolute: 558 cells/uL (ref 200–950)
Monocytes Relative: 9 %
NEUTROS PCT: 44 %
Neutro Abs: 2728 cells/uL (ref 1500–7800)
Platelets: 323 10*3/uL (ref 140–400)
RBC: 4.29 MIL/uL (ref 3.80–5.10)
RDW: 13.2 % (ref 11.0–15.0)
WBC: 6.2 10*3/uL (ref 4.0–10.5)

## 2016-06-07 LAB — T4, FREE: Free T4: 1.1 ng/dL (ref 0.8–1.8)

## 2016-06-07 LAB — TSH: TSH: 0.86 mIU/L

## 2016-06-07 MED ORDER — HYDROCODONE-ACETAMINOPHEN 5-325 MG PO TABS: 1.0000 | ORAL_TABLET | Freq: Four times a day (QID) | ORAL | 0 refills | Status: DC | PRN

## 2016-06-07 MED ORDER — CIPROFLOXACIN HCL 500 MG PO TABS: 500.0000 mg | ORAL_TABLET | Freq: Two times a day (BID) | ORAL | 0 refills | Status: DC

## 2016-06-07 NOTE — Progress Notes (Signed)
Subjective: Chief Complaint  Patient presents with  . kindey stones    in pain from kidney stones    Having right mid to low back pain x 2 days.   Last night pain was right lower abdomen.  Pain is intermittent.  Pain is stabbing pain.   But in the lower abdomen the pain is more like menstrual cramp "hard" pain.   Pain not worse or better with anything in particular.   Movement doesn't affect.  No recent injury, trauma, fall.   No fever.    No burning with urination, no blood in urine.   There is some stronger urine smell, not particularly and odor.   Does have some urinary frequency.   No vaginal discharge.  No other pelvic pain.    No constipation, no diarrhea, no nausea or vomiting.     No STD concerns.   Last UTI unsure, none in recent month.   Last pap smear 1 year ago, normal.   Is s/p hysterectomy due to fibroid.  Still has cervix and ovaries.  Sees gyn, [facility].   hasn't had any recent ultrasounds.    Has hx/o renal stones.   Doesn't recall passing any kidney stone debris.  Last visit here 2015.  Last Urology visit 2015.  Past Medical History:  Diagnosis Date  . Asthma   . Goiter    Current Outpatient Prescriptions on File Prior to Visit  Medication Sig Dispense Refill  . budesonide-formoterol (SYMBICORT) 160-4.5 MCG/ACT inhaler Inhale 2 puffs into the lungs 2 (two) times daily.    Marland Kitchen esomeprazole (NEXIUM) 40 MG capsule Take 1 capsule (40 mg total) by mouth daily. 30 capsule 1  . Fluticasone Propionate  HFA (FLOVENT HFA IN) Inhale 2 puffs into the lungs daily as needed (Shortness of btreath).    Marland Kitchen levothyroxine (SYNTHROID, LEVOTHROID) 88 MCG tablet Take 88 mcg by mouth daily before breakfast.    . cyclobenzaprine (FLEXERIL) 10 MG tablet Take 1 tablet (10 mg total) by mouth at bedtime. (Patient not taking: Reported on 06/07/2016) 20 tablet 0  . cyclobenzaprine (FLEXERIL) 5 MG tablet Take 1 tablet (5 mg total) by mouth 3 (three) times daily as needed for muscle spasms.  (Patient not taking: Reported on 06/07/2016) 30 tablet 0   No current facility-administered medications on file prior to visit.    Past Surgical History:  Procedure Laterality Date  . ABDOMINAL HYSTERECTOMY     ROS as in subjective   Objective: BP 132/82   Temp 98 F (36.7 C)   Wt 250 lb (113.4 kg)   SpO2 (!) 80%   BMI 41.60 kg/m   General appearance: alert, no distress, WD/WN, pending labs Heart: RRR, normal S1, S2, no murmurs Lungs: CTA bilaterally, no wheezes, rhonchi, or rales Abdomen: +bs, soft, non tender, non distended, no masses, no hepatomegaly, no splenomegaly Back: non tender, normal ROM without pain MSK: legs non tender, hip normal ROM, no swelling Ext: no edema Pulses: 2+ symmetric, upper and lower extremities, normal cap refill    Assessment: Encounter Diagnoses  Name Primary?  . Abdominal pain, unspecified abdominal location Yes  . Acute right-sided low back pain without sciatica   . Urinary frequency   . History of renal stone   . Hypothyroidism, unspecified type      Plan: discussed symptoms, concerns.  Suspect urinary tract infection vs mild pyelonephritis, vs other.   Labs today.  Begin medications below  [place] was seen today for kindey stones.  Diagnoses  and all orders for this visit:  Abdominal pain, unspecified abdominal location -     POCT urinalysis dipstick -     Urine culture -     Comprehensive metabolic panel -     CBC with Differential/Platelet  Acute right-sided low back pain without sciatica -     POCT urinalysis dipstick -     Urine culture -     Comprehensive metabolic panel -     CBC with Differential/Platelet  Urinary frequency -     POCT urinalysis dipstick -     Urine culture -     Comprehensive metabolic panel -     CBC with Differential/Platelet -     T4, free  History of renal stone -     POCT urinalysis dipstick  Hypothyroidism, unspecified type -     TSH -     T4, free  Other orders -      ciprofloxacin (CIPRO) 500 MG tablet; Take 1 tablet (500 mg total) by mouth 2 (two) times daily. -     HYDROcodone-acetaminophen (NORCO) 5-325 MG tablet; Take 1 tablet by mouth every 6 (six) hours as needed for moderate pain.

## 2016-06-08 LAB — URINE CULTURE

## 2016-06-12 ENCOUNTER — Encounter: Payer: Self-pay | Admitting: Internal Medicine

## 2016-06-29 ENCOUNTER — Encounter (HOSPITAL_COMMUNITY): Payer: Self-pay | Admitting: Emergency Medicine

## 2016-06-29 ENCOUNTER — Ambulatory Visit (HOSPITAL_COMMUNITY)
Admission: EM | Admit: 2016-06-29 | Discharge: 2016-06-29 | Disposition: A | Discharge status: Home / Self Care | Payer: Managed Care, Other (non HMO) | Attending: Radiology | Admitting: Radiology

## 2016-06-29 DIAGNOSIS — B9789 Other viral agents as the cause of diseases classified elsewhere: Secondary | ICD-10-CM | POA: Diagnosis not present

## 2016-06-29 DIAGNOSIS — J069 Acute upper respiratory infection, unspecified: Secondary | ICD-10-CM | POA: Diagnosis not present

## 2016-06-29 MED ORDER — DEXAMETHASONE SODIUM PHOSPHATE 10 MG/ML IJ SOLN
10.0000 mg | Freq: Once | INTRAMUSCULAR | Status: AC
  Administered 2016-06-29: 10 mg via INTRAMUSCULAR

## 2016-06-29 MED ORDER — PREDNISONE 5 MG PO TABS: 5.0000 mg | ORAL_TABLET | Freq: Every day | ORAL | 0 refills | Status: DC

## 2016-06-29 MED ORDER — IPRATROPIUM-ALBUTEROL 0.5-2.5 (3) MG/3ML IN SOLN
3.0000 mL | Freq: Once | RESPIRATORY_TRACT | Status: AC
  Administered 2016-06-29: 3 mL via RESPIRATORY_TRACT

## 2016-06-29 MED ORDER — IPRATROPIUM-ALBUTEROL 0.5-2.5 (3) MG/3ML IN SOLN
RESPIRATORY_TRACT | Status: AC
  Filled 2016-06-29: qty 3

## 2016-06-29 MED ORDER — DEXAMETHASONE SODIUM PHOSPHATE 10 MG/ML IJ SOLN
INTRAMUSCULAR | Status: AC
  Filled 2016-06-29: qty 1

## 2016-06-29 MED ORDER — BENZONATATE 100 MG PO CAPS: 100.0000 mg | ORAL_CAPSULE | Freq: Three times a day (TID) | ORAL | 0 refills | Status: DC

## 2016-06-29 NOTE — ED Triage Notes (Signed)
Here for cold sx 3 days associated w/fevers, prod cough, nasal drainage/congestion, dizziness, vomiting, BAs  A&O x4... NAD

## 2016-06-29 NOTE — Discharge Instructions (Signed)
Continue to push fluids and take over the counter medications as directed on the back of the box for symptomatic relief.  ° °

## 2016-06-29 NOTE — ED Provider Notes (Signed)
CSN: [id number]     Arrival date & time 06/29/16  1224 History   None    Chief Complaint  Patient presents with  . URI   (Consider location/radiation/quality/duration/timing/severity/associated sxs/prior Treatment) 45 y.o. female presents with cough, fever, joint pain and 1 episode of vomiting  X 4 day. Patient has a history of asthma Condition is acute in nature. Condition is made better by nothing. Condition is made worse at night. Patient denies any relief from OTC chloraseptic  prior to there arrival at this facility. Patient reports 2 family members with similar signs and symptoms       Past Medical History:  Diagnosis Date  . Asthma   . Goiter    Past Surgical History:  Procedure Laterality Date  . ABDOMINAL HYSTERECTOMY     Family History  Problem Relation Age of Onset  . Cancer Other   . Hypertension Other   . Hyperlipidemia Other   . Hypertension Mother   . Hypertension Father   . Hypertension Brother   . Diabetes Maternal Grandmother   . Hypertension Maternal Grandfather    Social History  Substance Use Topics  . Smoking status: Never Smoker  . Smokeless tobacco: Never Used  . Alcohol use Yes     Comment: occassionally   OB History    No data available     Review of Systems  Constitutional: Positive for fever.  HENT: Positive for congestion.   Respiratory: Positive for cough.   Gastrointestinal: Negative for vomiting (cough induced).    Allergies  Other; Iohexol; Aspirin; Diphenhydramine hcl; and Latex  Home Medications   Prior to Admission medications   Medication Sig Start Date End Date Taking? Authorizing Provider  budesonide-formoterol (SYMBICORT) 160-4.5 MCG/ACT inhaler Inhale 2 puffs into the lungs 2 (two) times daily.   Yes Historical Provider, MD  esomeprazole (NEXIUM) 40 MG capsule Take 1 capsule (40 mg total) by mouth daily. 06/07/14  Yes Camelia Eng Tysinger, PA-C  Fluticasone Propionate  HFA (FLOVENT HFA IN) Inhale 2 puffs into the lungs  daily as needed (Shortness of btreath).   Yes Historical Provider, MD  levothyroxine (SYNTHROID, LEVOTHROID) 88 MCG tablet Take 88 mcg by mouth daily before breakfast.   Yes Historical Provider, MD  benzonatate (TESSALON) 100 MG capsule Take 1 capsule (100 mg total) by mouth every 8 (eight) hours. 06/29/16   Jacqualine Mau, NP  ciprofloxacin (CIPRO) 500 MG tablet Take 1 tablet (500 mg total) by mouth 2 (two) times daily. 06/07/16   Camelia Eng Tysinger, PA-C  cyclobenzaprine (FLEXERIL) 10 MG tablet Take 1 tablet (10 mg total) by mouth at bedtime. Patient not taking: Reported on 06/07/2016 06/07/14   Camelia Eng Tysinger, PA-C  cyclobenzaprine (FLEXERIL) 5 MG tablet Take 1 tablet (5 mg total) by mouth 3 (three) times daily as needed for muscle spasms. Patient not taking: Reported on 06/07/2016 04/12/14   Billy Fischer, MD  HYDROcodone-acetaminophen (NORCO) 5-325 MG tablet Take 1 tablet by mouth every 6 (six) hours as needed for moderate pain. 06/07/16   Camelia Eng Tysinger, PA-C  predniSONE (DELTASONE) 5 MG tablet Take 1 tablet (5 mg total) by mouth daily with breakfast. 06/29/16   Jacqualine Mau, NP   Meds Ordered and Administered this Visit   Medications  ipratropium-albuterol (DUONEB) 0.5-2.5 (3) MG/3ML nebulizer solution 3 mL (3 mLs Nebulization Given 06/29/16 1406)  dexamethasone (DECADRON) injection 10 mg (10 mg Intramuscular Given 06/29/16 1407)    BP 128/72 (BP Location: Left Arm)  Pulse 115   Temp 99 F (37.2 C) (Oral)   Resp 20   SpO2 98%  No data found.   Physical Exam  Constitutional: She is oriented to person, place, and time. She appears well-developed and well-nourished.  HENT:  Head: Normocephalic and atraumatic.  Eyes: Conjunctivae are normal.  Neck: Normal range of motion.  Cardiovascular: Normal rate and regular rhythm.   Pulmonary/Chest: She has wheezes.  Chest tightness in upper lobes and bottom left.   Musculoskeletal: Normal range of motion.  Neurological: She  is alert and oriented to person, place, and time.  Skin: Skin is warm.  Psychiatric: She has a normal mood and affect.  Nursing note and vitals reviewed.   Urgent Care Course   Clinical Course     Procedures (including critical care time)  Labs Review Labs Reviewed - No data to display  Imaging Review No results found.   Breathing improved after duoneb   MDM   1. Viral upper respiratory tract infection        Jacqualine Mau, NP 06/29/16 [place], NP 06/29/16 1427

## 2016-06-30 ENCOUNTER — Emergency Department (HOSPITAL_COMMUNITY): Payer: Managed Care, Other (non HMO)

## 2016-06-30 ENCOUNTER — Emergency Department (HOSPITAL_COMMUNITY)
Admission: EM | Admit: 2016-06-30 | Discharge: 2016-06-30 | Disposition: A | Discharge status: Home / Self Care | Payer: Managed Care, Other (non HMO) | Attending: Emergency Medicine | Admitting: Emergency Medicine

## 2016-06-30 ENCOUNTER — Encounter (HOSPITAL_COMMUNITY): Payer: Self-pay | Admitting: Emergency Medicine

## 2016-06-30 DIAGNOSIS — J209 Acute bronchitis, unspecified: Secondary | ICD-10-CM | POA: Insufficient documentation

## 2016-06-30 DIAGNOSIS — Z79899 Other long term (current) drug therapy: Secondary | ICD-10-CM | POA: Insufficient documentation

## 2016-06-30 DIAGNOSIS — J45909 Unspecified asthma, uncomplicated: Secondary | ICD-10-CM | POA: Insufficient documentation

## 2016-06-30 DIAGNOSIS — E039 Hypothyroidism, unspecified: Secondary | ICD-10-CM | POA: Diagnosis not present

## 2016-06-30 DIAGNOSIS — Z91040 Latex allergy status: Secondary | ICD-10-CM | POA: Insufficient documentation

## 2016-06-30 DIAGNOSIS — R05 Cough: Secondary | ICD-10-CM | POA: Diagnosis present

## 2016-06-30 MED ORDER — AMOXICILLIN 500 MG PO CAPS: 1000.0000 mg | ORAL_CAPSULE | Freq: Two times a day (BID) | ORAL | 0 refills | Status: DC

## 2016-06-30 MED ORDER — PREDNISONE 20 MG PO TABS
60.0000 mg | ORAL_TABLET | Freq: Once | ORAL | Status: AC
  Administered 2016-06-30: 60 mg via ORAL
  Filled 2016-06-30: qty 3

## 2016-06-30 MED ORDER — AMOXICILLIN 500 MG PO CAPS
1000.0000 mg | ORAL_CAPSULE | Freq: Once | ORAL | Status: AC
  Administered 2016-06-30: 1000 mg via ORAL
  Filled 2016-06-30: qty 2

## 2016-06-30 MED ORDER — IPRATROPIUM-ALBUTEROL 0.5-2.5 (3) MG/3ML IN SOLN
3.0000 mL | Freq: Once | RESPIRATORY_TRACT | Status: AC
  Administered 2016-06-30: 3 mL via RESPIRATORY_TRACT
  Filled 2016-06-30: qty 3

## 2016-06-30 MED ORDER — PREDNISONE 20 MG PO TABS: ORAL_TABLET | ORAL | 0 refills | Status: DC

## 2016-06-30 NOTE — ED Triage Notes (Signed)
Pt took tylenol for fever at home 2 hours pta

## 2016-06-30 NOTE — ED Provider Notes (Signed)
[facility] DEPT Provider Note   CSN: [id number] Arrival date & time: 06/30/16  0454     History   Chief Complaint Chief Complaint  Patient presents with  . Cough    HPI Rebecca Soto is a 45 y.o. female.  She has had respiratory tract infection for the last 4 days. She complains of cough productive of gray to yellow sputum, fever to 102, chills, sweats. There is associated rhinorrhea, sore throat. She has had 2 episodes of posttussive emesis. She also is complaining of some generalized myalgias. There have been sick contacts at home, but they have all gotten better. She went to urgent care yesterday and was given a steroid injection and a prescription for prednisone 5 mg. She took the prednisone but is not feeling any better. She has used her inhaler at home without any benefit. She does state there was slight, temporary improvement with nebulizer treatment at urgent care. She has had the influenza immunization this season.   The history is provided by the patient.  Cough     Past Medical History:  Diagnosis Date  . Asthma   . Goiter     Patient Active Problem List   Diagnosis Date Noted  . Abdominal pain 06/07/2016  . Acute right-sided low back pain without sciatica 06/07/2016  . Urinary frequency 06/07/2016  . History of renal stone 06/07/2016  . Hypothyroidism 06/07/2016  . Other and unspecified ovarian cyst 01/12/2013  . Trichimoniasis 12/27/2012  . Unspecified symptom associated with female genital organs 12/21/2012    Past Surgical History:  Procedure Laterality Date  . ABDOMINAL HYSTERECTOMY      OB History    No data available       Home Medications    Prior to Admission medications   Medication Sig Start Date End Date Taking? Authorizing Provider  benzonatate (TESSALON) 100 MG capsule Take 1 capsule (100 mg total) by mouth every 8 (eight) hours. 06/29/16   Jacqualine Mau, NP  budesonide-formoterol (SYMBICORT) 160-4.5 MCG/ACT inhaler  Inhale 2 puffs into the lungs 2 (two) times daily.    Historical Provider, MD  ciprofloxacin (CIPRO) 500 MG tablet Take 1 tablet (500 mg total) by mouth 2 (two) times daily. 06/07/16   Camelia Eng Tysinger, PA-C  cyclobenzaprine (FLEXERIL) 10 MG tablet Take 1 tablet (10 mg total) by mouth at bedtime. Patient not taking: Reported on 06/07/2016 06/07/14   Camelia Eng Tysinger, PA-C  cyclobenzaprine (FLEXERIL) 5 MG tablet Take 1 tablet (5 mg total) by mouth 3 (three) times daily as needed for muscle spasms. Patient not taking: Reported on 06/07/2016 04/12/14   Billy Fischer, MD  esomeprazole (NEXIUM) 40 MG capsule Take 1 capsule (40 mg total) by mouth daily. 06/07/14   Camelia Eng Tysinger, PA-C  Fluticasone Propionate  HFA (FLOVENT HFA IN) Inhale 2 puffs into the lungs daily as needed (Shortness of btreath).    Historical Provider, MD  HYDROcodone-acetaminophen (NORCO) 5-325 MG tablet Take 1 tablet by mouth every 6 (six) hours as needed for moderate pain. 06/07/16   Camelia Eng Tysinger, PA-C  levothyroxine (SYNTHROID, LEVOTHROID) 88 MCG tablet Take 88 mcg by mouth daily before breakfast.    Historical Provider, MD  predniSONE (DELTASONE) 5 MG tablet Take 1 tablet (5 mg total) by mouth daily with breakfast. 06/29/16   Jacqualine Mau, NP    Family History Family History  Problem Relation Age of Onset  . Cancer Other   . Hypertension Other   . Hyperlipidemia Other   .  Hypertension Mother   . Hypertension Father   . Hypertension Brother   . Diabetes Maternal Grandmother   . Hypertension Maternal Grandfather     Social History Social History  Substance Use Topics  . Smoking status: Never Smoker  . Smokeless tobacco: Never Used  . Alcohol use Yes     Comment: occassionally     Allergies   Other; Iohexol; Aspirin; Diphenhydramine hcl; and Latex   Review of Systems Review of Systems  Respiratory: Positive for cough.   All other systems reviewed and are negative.    Physical Exam Updated  Vital Signs BP 135/84   Pulse 96   Temp 97.8 F (36.6 C)   Resp 18   SpO2 99%   Physical Exam  Nursing note and vitals reviewed.  45 year old female, resting comfortably and in no acute distress. Vital signs are normal. Oxygen saturation is 99%, which is normal. Head is normocephalic and atraumatic. PERRLA, EOMI. Oropharynx is clear. Neck is nontender and supple without adenopathy or JVD. Back is nontender and there is no CVA tenderness. Lungs have diffuse expiratory wheezes without rales or rhonchi. Chest is nontender. Heart has regular rate and rhythm without murmur. Abdomen is soft, flat, nontender without masses or hepatosplenomegaly and peristalsis is normoactive. Extremities have no cyanosis or edema, full range of motion is present. Skin is warm and dry without rash. Neurologic: Mental status is normal, cranial nerves are intact, there are no motor or sensory deficits.  ED Treatments / Results   Radiology Dg Chest 2 View  Result Date: 06/30/2016 CLINICAL DATA:  Chest pain cough fever and dyspnea for 4 days. EXAM: CHEST  2 VIEW COMPARISON:  02/07/2013 FINDINGS: The heart size and mediastinal contours are within normal limits. Both lungs are clear. The visualized skeletal structures are unremarkable. IMPRESSION: No active cardiopulmonary disease. Electronically Signed   By: Andreas Newport M.D.   On: 06/30/2016 05:37    Procedures Procedures (including critical care time)  Medications Ordered in ED Medications  amoxicillin (AMOXIL) capsule 1,000 mg (not administered)  ipratropium-albuterol (DUONEB) 0.5-2.5 (3) MG/3ML nebulizer solution 3 mL (3 mLs Nebulization Given 06/30/16 0521)  predniSONE (DELTASONE) tablet 60 mg (60 mg Oral Given 06/30/16 0521)  ipratropium-albuterol (DUONEB) 0.5-2.5 (3) MG/3ML nebulizer solution 3 mL (3 mLs Nebulization Given 06/30/16 0705)     Initial Impression / Assessment and Plan / ED Course  I have reviewed the triage vital signs and  the nursing notes.  Pertinent imaging results that were available during my care of the patient were reviewed by me and considered in my medical decision making (see chart for details).  Clinical Course    Respiratory tract infection which probably was viral, at least initially. We'll send for chest x-ray to rule out pneumonia. Old records are reviewed and she was discharged from urgent care with prescription for prednisone 5 mg daily. This is clearly an underdosed. She did receive an injection of dexamethasone 10 mg. She is given a dose of prednisone in the ED and sent for chest x-ray to rule out pneumonia. She is given albuterol with ipratropium by nebulizer.  Chest x-ray showed no evidence of pneumonia. She had slight improvement with initial nebulizer treatment. On exam, she still had significant wheezing. She's given a second nebulizer treatment with complete resolution of symptoms. On exam, lungs are completely clear. Because of ongoing symptoms with production of care. Sputum, it was decided to start her on antibiotics. She is sent home with a higher dose  of prednisone and a prescription for amoxicillin.  Final Clinical Impressions(s) / ED Diagnoses   Final diagnoses:  Acute bronchitis with bronchospasm    New Prescriptions New Prescriptions   AMOXICILLIN (AMOXIL) 500 MG CAPSULE    Take 2 capsules (1,000 mg total) by mouth 2 (two) times daily.   PREDNISONE (DELTASONE) 20 MG TABLET    2 tabs po daily x 4 days     Delora Fuel, MD [id number]

## 2016-06-30 NOTE — ED Notes (Signed)
Pt comfortable with discharge and follow up instructions. Pt declines wheelchair, escorted to waiting area by this RN. Rx x2

## 2016-06-30 NOTE — Discharge Instructions (Signed)
Continue using  your inhaler as needed for cough or wheezing. Continue taking the Prednisone 5 mg tablet you were prescribed yesterday - take it along with two of the 20 mg Prednisone being prescribed today.

## 2016-06-30 NOTE — ED Triage Notes (Signed)
Pt has had a cough for 3 days producing yellow sputum. Pt was seen at [facility] today and was given a treatment and steroids.

## 2016-07-04 ENCOUNTER — Encounter: Payer: Self-pay | Admitting: Medical

## 2016-07-04 ENCOUNTER — Ambulatory Visit (INDEPENDENT_AMBULATORY_CARE_PROVIDER_SITE_OTHER): Payer: Managed Care, Other (non HMO) | Admitting: Medical

## 2016-07-04 VITALS — BP 118/82 | HR 87 | Temp 98.1°F | Wt 250.0 lb

## 2016-07-04 DIAGNOSIS — R05 Cough: Secondary | ICD-10-CM

## 2016-07-04 DIAGNOSIS — R059 Cough, unspecified: Secondary | ICD-10-CM

## 2016-07-04 DIAGNOSIS — J1100 Influenza due to unidentified influenza virus with unspecified type of pneumonia: Secondary | ICD-10-CM

## 2016-07-04 DIAGNOSIS — J4541 Moderate persistent asthma with (acute) exacerbation: Secondary | ICD-10-CM

## 2016-07-04 MED ORDER — FLUCONAZOLE 150 MG PO TABS: 150.0000 mg | ORAL_TABLET | Freq: Once | ORAL | 0 refills | Status: AC

## 2016-07-04 MED ORDER — ALBUTEROL SULFATE HFA 108 (90 BASE) MCG/ACT IN AERS: 2.0000 | INHALATION_SPRAY | Freq: Four times a day (QID) | RESPIRATORY_TRACT | 1 refills | Status: DC | PRN

## 2016-07-04 NOTE — Progress Notes (Signed)
Subjective: Chief Complaint  Patient presents with  . er follow up    follow up , congestion, coughing , runny nose    Here for illness, ED f/u.   Went to Urgent Care Saturday for cold symptoms.  Had been using OTC cough syrup.  Late that night fever went to 102, was diagnosed with the flu.  Went back home , felt worse, had rattle in chest.  Went back to ED, was put on antibiotic for possible pneumonia, was given prednisone.  Is taking Amoxicillin 500mg   2 tablets BID.  Yesterday saw some blood in sputum, streaks.   Still has cough, congestion.   Last night tongue felt funny.  No fever in recent days.   Has had some nausea.   Using inhaler twice daily.   Hasn't had chills or body aches in recent days.   No other aggravating or relieving factors. No other complaint.   Past Medical History:  Diagnosis Date  . Asthma   . Goiter    Current Outpatient Prescriptions on File Prior to Visit  Medication Sig Dispense Refill  . amoxicillin (AMOXIL) 500 MG capsule Take 2 capsules (1,000 mg total) by mouth 2 (two) times daily. 40 capsule 0  . benzonatate (TESSALON) 100 MG capsule Take 1 capsule (100 mg total) by mouth every 8 (eight) hours. 21 capsule 0  . budesonide-formoterol (SYMBICORT) 160-4.5 MCG/ACT inhaler Inhale 2 puffs into the lungs 2 (two) times daily.    . Fluticasone Propionate  HFA (FLOVENT HFA IN) Inhale 2 puffs into the lungs daily as needed (Shortness of btreath).    Marland Kitchen HYDROcodone-acetaminophen (NORCO) 5-325 MG tablet Take 1 tablet by mouth every 6 (six) hours as needed for moderate pain. 10 tablet 0  . levothyroxine (SYNTHROID, LEVOTHROID) 88 MCG tablet Take 88 mcg by mouth daily before breakfast.    . predniSONE (DELTASONE) 20 MG tablet 2 tabs po daily x 4 days 10 tablet 0  . predniSONE (DELTASONE) 5 MG tablet Take 1 tablet (5 mg total) by mouth daily with breakfast. 5 tablet 0   No current facility-administered medications on file prior to visit.    ROS as in  subjective   Objective: BP 118/82   Pulse 87   Temp 98.1 F (36.7 C)   Wt 250 lb (113.4 kg)   SpO2 97%   BMI 41.60 kg/m   General appearance: Alert, WD/WN, no distress                             Skin: warm, no rash, no diaphoresis                           Head: no sinus tenderness                            Eyes: conjunctiva normal, corneas clear, PERRLA                            Ears: pearly TMs, external ear canals normal                          Nose: septum midline, turbinates swollen, with erythema and mucoid discharge             Mouth/throat: MMM, tongue normal, mild pharyngeal erythema  Neck: supple, no adenopathy, no thyromegaly, non tender                          Heart: RRR, normal S1, S2, no murmurs                         Lungs: clear, no rhonchi, no wheezes, no rales                Extremities: no edema, non tender       Assessment: Encounter Diagnoses  Name Primary?  . Cough Yes  . Moderate persistent asthma with acute exacerbation   . Influenza with pneumonia     Plan: Your exam today and vital signs and symptoms suggest you are improving in the right direction  Recommendations  Increase water intake  Finish the Amoxicillin antibiotic  BEGIN Mucinex DM OTC for cough/congestion  Use your Proair/Albuterol inhaler every 4-6 hours this week for cough, wheezing, shortness of breath  After Sunday, if much improved, then just use your Proair rescue inhaler as needed  Continue Symbicort 2 puffs twice daily for prevention of asthma flare up  The tessalon Perles cough drops can be used as needed for worse cough  Diflucan given prn if antibiotic associated yeast infection per her request  Carreen was seen today for er follow up.  Diagnoses and all orders for this visit:  Cough  Moderate persistent asthma with acute exacerbation  Influenza with pneumonia  Other orders -     fluconazole (DIFLUCAN) 150 MG tablet; Take 1  tablet (150 mg total) by mouth once. May repeat again in 1 week -     albuterol (PROVENTIL HFA;VENTOLIN HFA) 108 (90 Base) MCG/ACT inhaler; Inhale 2 puffs into the lungs every 6 (six) hours as needed for wheezing or shortness of breath.

## 2016-07-04 NOTE — Patient Instructions (Signed)
Your exam today and vital signs and symptoms suggest you are improving in the right direction  Recommendations  Increase water intake  Finish the Amoxicillin antibiotic  BEGIN Mucinex DM OTC for cough/congestion  Use your Proair/Albuterol inhaler every 4-6 hours this week for cough, wheezing, shortness of breath  After Sunday, if much improved, then just use your Proair rescue inhaler as needed  Continue Symbicort 2 puffs twice daily for prevention of asthma flare up  The tessalon Perles cough drops can be used as needed for worse cough     Using Saline Nose Drops with Bulb Syringe A bulb syringe is used to clear your nose. You may use it when you have a stuffy nose, nasal congestion, sinus pressure, or sneezing.   SALINE SOLUTION You can buy nose drops at your local drug store. You can also make nose drops yourself. Mix 1 cup of water with  teaspoon of salt. Stir. Store this mixture at room temperature. Make a new batch daily.  USE THE BULB IN COMBINATION WITH SALINE NOSE DROPS  Squeeze the air out of the bulb before suctioning the saline mixture.  While still squeezing the bulb flat, place the tip of the bulb into the saline mixture.  Let air come back into the bulb.  This will suction up the saline mixture.  Gently flush one nostril at a time.  Salt water nose drops will then moisten your  congested nose and loosen secretions before suctioning.  Use the bulb syringe as directed below to suction.  USING THE BULB SYRINGE TO SUCTION  While still squeezing the bulb flat, place the tip of the bulb into a nostril. Let air come back into the bulb. The suction will pull snot out of the nose and into the bulb.  Repeat on the other nostril.  Squeeze syringe several times into a tissue.  CLEANING THE BULB SYRINGE  Clean the bulb syringe every day with hot soapy water.  Clean the inside of the bulb by squeezing the bulb while the tip is in soapy water.  Rinse by squeezing  the bulb while the tip is in clean hot water.  Store the bulb with the tip side down on paper towel.  HOME CARE INSTRUCTIONS   Use saline nose drops often to keep the nose open and not stuffy.  Throw away used salt water. Make a new solution every time.  Do not use the same solution and dropper for another person  If you do not prefer to use nasal saline flush, other options include nasal saline spray or the [employer], both of which are available over the counter at your pharmacy.

## 2016-10-04 ENCOUNTER — Ambulatory Visit (INDEPENDENT_AMBULATORY_CARE_PROVIDER_SITE_OTHER): Payer: Commercial Managed Care - PPO | Admitting: Pulmonary Disease

## 2016-10-04 ENCOUNTER — Other Ambulatory Visit (INDEPENDENT_AMBULATORY_CARE_PROVIDER_SITE_OTHER): Payer: Commercial Managed Care - PPO

## 2016-10-04 ENCOUNTER — Telehealth: Payer: Self-pay | Admitting: Pulmonary Disease

## 2016-10-04 ENCOUNTER — Encounter: Payer: Self-pay | Admitting: Pulmonary Disease

## 2016-10-04 VITALS — BP 134/92 | HR 92 | Ht 65.0 in | Wt 250.4 lb

## 2016-10-04 DIAGNOSIS — R0602 Shortness of breath: Secondary | ICD-10-CM

## 2016-10-04 LAB — CBC WITH DIFFERENTIAL/PLATELET
BASOS ABS: 0 10*3/uL (ref 0.0–0.1)
Basophils Relative: 0.4 % (ref 0.0–3.0)
EOS ABS: 0.1 10*3/uL (ref 0.0–0.7)
Eosinophils Relative: 2.6 % (ref 0.0–5.0)
HEMATOCRIT: 40.4 % (ref 36.0–46.0)
Hemoglobin: 13.6 g/dL (ref 12.0–15.0)
LYMPHS PCT: 47 % — AB (ref 12.0–46.0)
Lymphs Abs: 2.7 10*3/uL (ref 0.7–4.0)
MCHC: 33.6 g/dL (ref 30.0–36.0)
MCV: 89.5 fl (ref 78.0–100.0)
Monocytes Absolute: 0.5 10*3/uL (ref 0.1–1.0)
Monocytes Relative: 9.2 % (ref 3.0–12.0)
NEUTROS ABS: 2.3 10*3/uL (ref 1.4–7.7)
NEUTROS PCT: 40.8 % — AB (ref 43.0–77.0)
PLATELETS: 345 10*3/uL (ref 150.0–400.0)
RBC: 4.51 Mil/uL (ref 3.87–5.11)
RDW: 13.4 % (ref 11.5–15.5)
WBC: 5.6 10*3/uL (ref 4.0–10.5)

## 2016-10-04 LAB — NITRIC OXIDE: Nitric Oxide: 27

## 2016-10-04 MED ORDER — ESOMEPRAZOLE MAGNESIUM 40 MG PO PACK: 40.0000 mg | PACK | Freq: Every day | ORAL | 3 refills | Status: DC

## 2016-10-04 MED ORDER — CHLORPHENIRAMINE MALEATE 4 MG PO TABS: 8.0000 mg | ORAL_TABLET | Freq: Three times a day (TID) | ORAL | 0 refills | Status: DC

## 2016-10-04 MED ORDER — MONTELUKAST SODIUM 10 MG PO TABS: 10.0000 mg | ORAL_TABLET | Freq: Every day | ORAL | 3 refills | Status: DC

## 2016-10-04 NOTE — Progress Notes (Signed)
Rebecca Soto    [id number]    1970-11-08  Primary Care Physician:[facility]  Referring Physician: [place] [address] [place], [place] [id number]  Chief complaint:  Consult for evaluation of asthma  HPI: 46 year old with past medical history of moderate persistent asthma. She was diagnosed with asthma in childhood, had been reasonably controlled but she reports worsening symptoms for the past 2 months with daily dyspnea, cough, wheezing. She does not recall any precipitation causes. She is using her albuterol inhaler up to 2 times a day with daily nocturnal awakenings. She is maintained on Symbicort and albuterol and has received 3 prednisone tapers over the past 2 months.  She is a nonsmoker with no exposure to secondhand smoke. 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 She has history of GERD, seasonal allergies, postnasal drip. She used to see an allergist and get regular allergy shots in the past.  Outpatient Encounter Prescriptions as of 10/04/2016  Medication Sig  . albuterol (PROVENTIL HFA;VENTOLIN HFA) 108 (90 Base) MCG/ACT inhaler Inhale 2 puffs into the lungs every 6 (six) hours as needed for wheezing or shortness of breath.  . budesonide-formoterol (SYMBICORT) 160-4.5 MCG/ACT inhaler Inhale 2 puffs into the lungs 2 (two) times daily.  Marland Kitchen levothyroxine (SYNTHROID, LEVOTHROID) 88 MCG tablet Take 88 mcg by mouth daily before breakfast.  . benzonatate (TESSALON) 100 MG capsule Take 1 capsule (100 mg total) by mouth every 8 (eight) hours. (Patient not taking: Reported on 10/04/2016)  . HYDROcodone-acetaminophen (NORCO) 5-325 MG tablet Take 1 tablet by mouth every 6 (six) hours as needed for moderate pain. (Patient not taking: Reported on 10/04/2016)  . [DISCONTINUED] amoxicillin (AMOXIL) 500 MG capsule Take 2 capsules (1,000 mg total) by mouth 2 (two) times daily. (Patient not taking:  Reported on 10/04/2016)  . [DISCONTINUED] Fluticasone Propionate  HFA (FLOVENT HFA IN) Inhale 2 puffs into the lungs daily as needed (Shortness of btreath).  . [DISCONTINUED] predniSONE (DELTASONE) 20 MG tablet 2 tabs po daily x 4 days (Patient not taking: Reported on 10/04/2016)  . [DISCONTINUED] predniSONE (DELTASONE) 5 MG tablet Take 1 tablet (5 mg total) by mouth daily with breakfast. (Patient not taking: Reported on 10/04/2016)   No facility-administered encounter medications on file as of 10/04/2016.     Allergies as of 10/04/2016 - Review Complete 10/04/2016  Allergen Reaction Noted  . Other Anaphylaxis 10/16/2011  . Iohexol Hives 06/01/2014  . Aspirin Other (See Comments) 10/16/2011  . Diphenhydramine hcl Other (See Comments) 10/16/2011  . Latex  12/21/2012    Past Medical History:  Diagnosis Date  . Asthma   . Goiter     Past Surgical History:  Procedure Laterality Date  . ABDOMINAL HYSTERECTOMY      Family History  Problem Relation Age of Onset  . Cancer Other   . Hypertension Other   . Hyperlipidemia Other   . Hypertension Mother   . Hypertension Father   . Hypertension Brother   . Diabetes Maternal Grandmother   . Hypertension Maternal Grandfather     Social History   Social History  . Marital status: Divorced    Spouse name: N/A  . Number of children: N/A  . Years of education: N/A   Occupational History  . Not on file.   Social History Main Topics  . Smoking status: Never Smoker  . Smokeless tobacco: Never Used  . Alcohol use Yes  Comment: occassionally  . Drug use: No  . Sexual activity: Yes    Birth control/ protection: Surgical   Other Topics Concern  . Not on file   Social History Narrative  . No narrative on file    Review of systems: Review of Systems  Constitutional: Negative for fever and chills.  HENT: Negative.   Eyes: Negative for blurred vision.  Respiratory: as per HPI  Cardiovascular: Negative for chest pain and  palpitations.  Gastrointestinal: Negative for vomiting, diarrhea, blood per rectum. Genitourinary: Negative for dysuria, urgency, frequency and hematuria.  Musculoskeletal: Negative for myalgias, back pain and joint pain.  Skin: Negative for itching and rash.  Neurological: Negative for dizziness, tremors, focal weakness, seizures and loss of consciousness.  Endo/Heme/Allergies: Negative for environmental allergies.  Psychiatric/Behavioral: Negative for depression, suicidal ideas and hallucinations.  All other systems reviewed and are negative.  Physical Exam: Blood pressure (!) 134/92, pulse 92, height 5\' 5"  (1.651 m), weight 250 lb 6.4 oz (113.6 kg), SpO2 98 %. Gen:      No acute distress HEENT:  EOMI, sclera anicteric Neck:     No masses; no thyromegaly Lungs:    Clear to auscultation bilaterally; normal respiratory effort CV:         Regular rate and rhythm; no murmurs Abd:      + bowel sounds; soft, non-tender; no palpable masses, no distension Ext:    No edema; adequate peripheral perfusion Skin:      Warm and dry; no rash Neuro: alert and oriented x 3 Psych: normal mood and affect  Data Reviewed: FENO 10/05/15- 27  CXR 06/30/16- no acute cardiopulmonary abnormality. I have reviewed the images personally.  Assessment:  Moderate persistent asthma With worsening control over the past few months. There are no obvious precipitating factors for her worsening symptoms. She'll continue on the Symbicort and albuterol for now. She will need better control of her postnasal drip and GERD. I'll add Singulair and antihistamine with chlorpheniramine antihistamine nasal spray. She'll get a prescription for Nexium 40 mg daily.  Get CBC with differential and a blood allergy profile to determine if she may be a candidate for biologic therapy and pulmonary function tests for an assessment of lung function.  Plan/Recommendations: - CBC with diff, blood allergy profile - PFTs - Continue  Symbicort, albuterol. Add Singulair. - Chlorpheniramine 8 mg tid and dymista nasal spray - Add nexium 40 mg daily.  Marshell Garfinkel MD [facility] Pager [phone] 10/04/2016, 3:32 PM  CC: Medicine, [place]*

## 2016-10-04 NOTE — Patient Instructions (Addendum)
We will schedule you for pulmonary function tests Will check CBC with differential and a blood allergy profile   Continue using the Symbicort and albuterol Will add Singulair 10 mg daily Add chlorpheniramine 8 mg 3 times daily and demonstrated nasal spray Add Nexium 40 mg a day Return to clinic in 1-2 months.

## 2016-10-04 NOTE — Telephone Encounter (Signed)
Spoke with [facility]. They needed clarification on her Nexium 40mg . I have given clarification. Nothing further was needed.

## 2016-10-07 LAB — RESPIRATORY ALLERGY PROFILE REGION II ~~LOC~~
ALLERGEN, D PTERNOYSSINUS, D1: 1.73 kU/L — AB
ALLERGEN, MOUSE U PROTEIN, E72: 0.53 kU/L — AB
Allergen, Comm Silver Birch, t9: <0.1 kU/L
Allergen, Mulberry, t76: <0.1 kU/L
Allergen, Oak,t7: <0.1 kU/L
Bermuda Grass: <0.1 kU/L
Box Elder IgE: <0.1 kU/L
Cat Dander: 0.83 kU/L — ABNORMAL HIGH
Cockroach: <0.1 kU/L
D. FARINAE: 2.31 kU/L — AB
Dog Dander: 7.46 kU/L — ABNORMAL HIGH
Elm IgE: <0.1 kU/L
IgE (Immunoglobulin E), Serum: 48 kU/L (ref <115)
Sheep Sorrel IgE: <0.1 kU/L

## 2016-11-01 ENCOUNTER — Telehealth: Payer: Self-pay | Admitting: Pulmonary Disease

## 2016-11-01 NOTE — Telephone Encounter (Signed)
Try OTC zyrtec or claritin

## 2016-11-01 NOTE — Telephone Encounter (Signed)
Pt states she is having an allergic reaction to chlorpheniramine- when she takes it she experiences itching all over.  No hives, difficulty breathing/worsening SOB, fever noted.  Pt stopped med Xseveral days and itching stopped, but when she resumed med the itching resumed.  I've advised pt to d/c chlorpheniramine and I've added this to her allergy list. Pt is requesting an alternative to this medication.    PM please advise on further recs.  Thanks.

## 2016-11-01 NOTE — Telephone Encounter (Signed)
Called and lmom to make the pt aware of PM recs.  Nothing further is needed.

## 2016-11-01 NOTE — Telephone Encounter (Signed)
Can try benadryl OTC 25 mg three times daily as needed

## 2016-11-01 NOTE — Telephone Encounter (Signed)
Called and spoke with pt and she is aware of PM recs but stated that she is allergic to benadryl---this is listed in her chart.  PM please advise of any other recs for her.  Thanks  Allergies  Allergen Reactions  . Other Anaphylaxis    Artichokes  . Iohexol Hives  . Aspirin Other (See Comments)    Dry throat and coughing   . Chlorpheniramine Itching  . Diphenhydramine Hcl Other (See Comments)    Unknown  . Latex      Current Outpatient Prescriptions on File Prior to Visit  Medication Sig Dispense Refill  . albuterol (PROVENTIL HFA;VENTOLIN HFA) 108 (90 Base) MCG/ACT inhaler Inhale 2 puffs into the lungs every 6 (six) hours as needed for wheezing or shortness of breath. 1 Inhaler 1  . benzonatate (TESSALON) 100 MG capsule Take 1 capsule (100 mg total) by mouth every 8 (eight) hours. (Patient not taking: Reported on 10/04/2016) 21 capsule 0  . budesonide-formoterol (SYMBICORT) 160-4.5 MCG/ACT inhaler Inhale 2 puffs into the lungs 2 (two) times daily.    Marland Kitchen esomeprazole (NEXIUM) 40 MG packet Take 40 mg by mouth daily before breakfast. 30 each 3  . HYDROcodone-acetaminophen (NORCO) 5-325 MG tablet Take 1 tablet by mouth every 6 (six) hours as needed for moderate pain. (Patient not taking: Reported on 10/04/2016) 10 tablet 0  . levothyroxine (SYNTHROID, LEVOTHROID) 88 MCG tablet Take 88 mcg by mouth daily before breakfast.    . montelukast (SINGULAIR) 10 MG tablet Take 1 tablet (10 mg total) by mouth at bedtime. 30 tablet 3   No current facility-administered medications on file prior to visit.

## 2016-12-17 ENCOUNTER — Ambulatory Visit: Payer: Commercial Managed Care - PPO | Admitting: Pulmonary Disease

## 2017-01-31 ENCOUNTER — Ambulatory Visit (INDEPENDENT_AMBULATORY_CARE_PROVIDER_SITE_OTHER): Payer: Commercial Managed Care - PPO | Admitting: Pulmonary Disease

## 2017-01-31 DIAGNOSIS — R0602 Shortness of breath: Secondary | ICD-10-CM

## 2017-01-31 LAB — PULMONARY FUNCTION TEST
DL/VA % PRED: 119 %
DL/VA: 5.88 ml/min/mmHg/L
DLCO COR % PRED: 97 %
DLCO COR: 25.08 ml/min/mmHg
DLCO unc % pred: 99 %
DLCO unc: 25.53 ml/min/mmHg
FEF 25-75 POST: 2.16 L/s
FEF 25-75 Pre: 1.4 L/sec
FEF2575-%CHANGE-POST: 54 %
FEF2575-%PRED-PRE: 47 %
FEF2575-%Pred-Post: 72 %
FEV1-%CHANGE-POST: 15 %
FEV1-%Pred-Post: 74 %
FEV1-%Pred-Pre: 64 %
FEV1-Post: 2.23 L
FEV1-Pre: 1.94 L
FEV1FVC-%CHANGE-POST: 13 %
FEV1FVC-%Pred-Pre: 89 %
FEV6-%CHANGE-POST: 1 %
FEV6-%Pred-Post: 74 %
FEV6-%Pred-Pre: 73 %
FEV6-PRE: 2.67 L
FEV6-Post: 2.71 L
FEV6FVC-%PRED-PRE: 102 %
FEV6FVC-%Pred-Post: 102 %
FVC-%Change-Post: 1 %
FVC-%PRED-POST: 72 %
FVC-%Pred-Pre: 71 %
FVC-PRE: 2.67 L
FVC-Post: 2.71 L
POST FEV1/FVC RATIO: 82 %
Post FEV6/FVC ratio: 100 %
Pre FEV1/FVC ratio: 73 %
Pre FEV6/FVC Ratio: 100 %
RV % pred: 114 %
RV: 2.02 L
TLC % pred: 90 %
TLC: 4.71 L

## 2017-01-31 NOTE — Progress Notes (Signed)
PFT done today. 

## 2017-02-12 ENCOUNTER — Other Ambulatory Visit: Payer: Self-pay | Admitting: Pulmonary Disease

## 2017-02-12 MED ORDER — MONTELUKAST SODIUM 10 MG PO TABS: 10.0000 mg | ORAL_TABLET | Freq: Every day | ORAL | 3 refills | Status: DC

## 2017-02-12 MED ORDER — ESOMEPRAZOLE MAGNESIUM 40 MG PO PACK: 40.0000 mg | PACK | Freq: Every day | ORAL | 3 refills | Status: DC

## 2017-02-14 ENCOUNTER — Telehealth: Payer: Self-pay | Admitting: Pulmonary Disease

## 2017-02-14 NOTE — Telephone Encounter (Signed)
Spoke with pt, who wanted to clarify that she should keep her scheduled apt for 7/31 since being made aware of PFT results.  PM please advise. Thanks.

## 2017-02-17 ENCOUNTER — Encounter: Payer: Self-pay | Admitting: Medical

## 2017-02-17 ENCOUNTER — Ambulatory Visit (INDEPENDENT_AMBULATORY_CARE_PROVIDER_SITE_OTHER): Payer: Commercial Managed Care - PPO | Admitting: Medical

## 2017-02-17 VITALS — BP 122/78 | HR 78 | Temp 98.7°F | Wt 247.8 lb

## 2017-02-17 DIAGNOSIS — J3489 Other specified disorders of nose and nasal sinuses: Secondary | ICD-10-CM | POA: Diagnosis not present

## 2017-02-17 DIAGNOSIS — R300 Dysuria: Secondary | ICD-10-CM | POA: Diagnosis not present

## 2017-02-17 DIAGNOSIS — J029 Acute pharyngitis, unspecified: Secondary | ICD-10-CM

## 2017-02-17 DIAGNOSIS — K120 Recurrent oral aphthae: Secondary | ICD-10-CM

## 2017-02-17 LAB — POCT URINALYSIS DIP (PROADVANTAGE DEVICE)
BILIRUBIN UA: NEGATIVE
Glucose, UA: NEGATIVE mg/dL
Ketones, POC UA: NEGATIVE mg/dL
Leukocytes, UA: NEGATIVE
NITRITE UA: NEGATIVE
Protein Ur, POC: NEGATIVE mg/dL
RBC UA: NEGATIVE
Specific Gravity, Urine: 1.02
Urobilinogen, Ur: NEGATIVE
pH, UA: 7.5 (ref 5.0–8.0)

## 2017-02-17 NOTE — Telephone Encounter (Signed)
If symptoms are stable she can follow up in 6 months. Please make appointment.   Marshell Garfinkel MD [facility] 02/17/2017, 9:15 AM

## 2017-02-17 NOTE — Addendum Note (Signed)
Addended by: Tyrone Apple on: 02/17/2017 05:20 PM   Modules accepted: Orders

## 2017-02-17 NOTE — Progress Notes (Signed)
Subjective: Chief Complaint  Patient presents with  . month sore and sore throat    month ulcers and sore throat , and lower back pain    Here for a few symptoms.  2 days ago started having sore throat, sinus pressure and has sore place inside right mouth.  No fever, no NVD, no sick contacts, no cough.   She also worries about UTI.  Went to urgent care 2 weeks ago for urinary frequency, odor, was put on antibiotic, not sure of the name.   Got better but then in past few days has same urination frequency, odor in urine.   No blood in urine ,no fever, has some mild back pain.  No vaginal c/o, no concern for STD.   No other aggravating or relieving factors. No other complaint.  Past Medical History:  Diagnosis Date  . Asthma   . Goiter    Current Outpatient Prescriptions on File Prior to Visit  Medication Sig Dispense Refill  . albuterol (PROVENTIL HFA;VENTOLIN HFA) 108 (90 Base) MCG/ACT inhaler Inhale 2 puffs into the lungs every 6 (six) hours as needed for wheezing or shortness of breath. 1 Inhaler 1  . budesonide-formoterol (SYMBICORT) 160-4.5 MCG/ACT inhaler Inhale 2 puffs into the lungs 2 (two) times daily.    Marland Kitchen esomeprazole (NEXIUM) 40 MG packet Take 40 mg by mouth daily before breakfast. 30 each 3  . levothyroxine (SYNTHROID, LEVOTHROID) 88 MCG tablet Take 88 mcg by mouth daily before breakfast.    . montelukast (SINGULAIR) 10 MG tablet Take 1 tablet (10 mg total) by mouth at bedtime. 30 tablet 3   No current facility-administered medications on file prior to visit.    ROS as in subjective   Objective: BP 122/78   Pulse 78   Temp 98.7 F (37.1 C)   Wt 247 lb 12.8 oz (112.4 kg)   SpO2 98%   BMI 41.24 kg/m   General appearance: alert, no distress, WD/WN HEENT: normocephalic, sclerae anicteric, conjunctiva pink and moist, TMs pearly, nares patent, no discharge or erythema, pharynx normal, tonsils unremarkable Oral cavity: MMM, right side of mouth on medial side of gum just  below posterior molar on bottom with 51mm diameter whitish appearing lesion c/w aphthous ulcer, thirsted no lesions neck: supple, no lymphadenopathy, no thyromegaly, no masses Heart: RRR, normal S1, S2, no murmurs Lungs: CTA bilaterally, no wheezes, rhonchi, or rales Back: nontender Abdomen nontender, no mass Pulses: 2+ symmetric      Assessment: Encounter Diagnoses  Name Primary?  . Dysuria Yes  . Ulcer aphthous oral   . Sore throat   . Sinus pressure      Plan: Dysuria - UA reviewed.  advised increased water intake, urine culture sent.    Aphthous ulcer, sinus pressure, sore throat - seems to be viral URI and aphthous ulcer.   Advised salt water gargles, increased water intake, can use OTC remedy for congestion.     Rebecca Soto was seen today for month sore and sore throat.  Diagnoses and all orders for this visit:  Dysuria -     Urine Culture  Ulcer aphthous oral  Sore throat  Sinus pressure

## 2017-02-17 NOTE — Telephone Encounter (Signed)
Spoke with pt, who states symptoms are stable. Pt states breathing is doing well with her current inhalers. Apt for 02/18/17 has been canceled. Recall for 35mo has been placed.  Pt advised to contact our office if any new symptoms develop. Nothing further needed.

## 2017-02-18 ENCOUNTER — Ambulatory Visit: Payer: Commercial Managed Care - PPO | Admitting: Pulmonary Disease

## 2017-02-18 LAB — URINE CULTURE: Organism ID, Bacteria: NO GROWTH

## 2017-03-03 ENCOUNTER — Other Ambulatory Visit: Payer: Self-pay

## 2017-03-03 MED ORDER — ESOMEPRAZOLE MAGNESIUM 40 MG PO CPDR: 40.0000 mg | DELAYED_RELEASE_CAPSULE | Freq: Every day | ORAL | 5 refills | Status: DC

## 2017-03-03 NOTE — Telephone Encounter (Signed)
Received alternative request from Nexium DR 40mg  Packet from [facility] on [address].  [facility] requesting [facility]. Per PM verbally okay to send. Rx has been sent to preferred pharmacy. Nothing further needed.

## 2017-04-02 ENCOUNTER — Emergency Department (HOSPITAL_COMMUNITY): Payer: Commercial Managed Care - PPO

## 2017-04-02 ENCOUNTER — Emergency Department (HOSPITAL_COMMUNITY)
Admission: EM | Admit: 2017-04-02 | Discharge: 2017-04-02 | Disposition: A | Discharge status: Home / Self Care | Payer: Commercial Managed Care - PPO | Attending: Emergency Medicine | Admitting: Emergency Medicine

## 2017-04-02 ENCOUNTER — Encounter (HOSPITAL_COMMUNITY): Payer: Self-pay | Admitting: Emergency Medicine

## 2017-04-02 DIAGNOSIS — E039 Hypothyroidism, unspecified: Secondary | ICD-10-CM | POA: Insufficient documentation

## 2017-04-02 DIAGNOSIS — H8112 Benign paroxysmal vertigo, left ear: Secondary | ICD-10-CM | POA: Diagnosis not present

## 2017-04-02 DIAGNOSIS — Z79899 Other long term (current) drug therapy: Secondary | ICD-10-CM | POA: Insufficient documentation

## 2017-04-02 DIAGNOSIS — R51 Headache: Secondary | ICD-10-CM | POA: Insufficient documentation

## 2017-04-02 DIAGNOSIS — J45909 Unspecified asthma, uncomplicated: Secondary | ICD-10-CM | POA: Diagnosis not present

## 2017-04-02 DIAGNOSIS — Z91040 Latex allergy status: Secondary | ICD-10-CM | POA: Insufficient documentation

## 2017-04-02 DIAGNOSIS — R079 Chest pain, unspecified: Secondary | ICD-10-CM | POA: Diagnosis present

## 2017-04-02 LAB — CBC
HCT: 38.8 % (ref 36.0–46.0)
HEMOGLOBIN: 12.6 g/dL (ref 12.0–15.0)
MCH: 29.6 pg (ref 26.0–34.0)
MCHC: 32.5 g/dL (ref 30.0–36.0)
MCV: 91.3 fL (ref 78.0–100.0)
Platelets: 325 10*3/uL (ref 150–400)
RBC: 4.25 MIL/uL (ref 3.87–5.11)
RDW: 13.5 % (ref 11.5–15.5)
WBC: 6.8 10*3/uL (ref 4.0–10.5)

## 2017-04-02 LAB — URINALYSIS, ROUTINE W REFLEX MICROSCOPIC
Bilirubin Urine: NEGATIVE
Glucose, UA: NEGATIVE mg/dL
Hgb urine dipstick: NEGATIVE
Ketones, ur: NEGATIVE mg/dL
Leukocytes, UA: NEGATIVE
Nitrite: NEGATIVE
Protein, ur: NEGATIVE mg/dL
Specific Gravity, Urine: 1.012 (ref 1.005–1.030)
pH: 8 (ref 5.0–8.0)

## 2017-04-02 LAB — I-STAT TROPONIN, ED: TROPONIN I, POC: 0 ng/mL (ref 0.00–0.08)

## 2017-04-02 LAB — BASIC METABOLIC PANEL
ANION GAP: 6 (ref 5–15)
BUN: 9 mg/dL (ref 6–20)
CALCIUM: 8.5 mg/dL — AB (ref 8.9–10.3)
CO2: 24 mmol/L (ref 22–32)
CREATININE: 1.03 mg/dL — AB (ref 0.44–1.00)
Chloride: 106 mmol/L (ref 101–111)
GFR calc Af Amer: >60 mL/min (ref >60)
GFR calc non Af Amer: >60 mL/min (ref >60)
GLUCOSE: 126 mg/dL — AB (ref 65–99)
Potassium: 3 mmol/L — ABNORMAL LOW (ref 3.5–5.1)
Sodium: 136 mmol/L (ref 135–145)

## 2017-04-02 MED ORDER — SODIUM CHLORIDE 0.9 % IV BOLUS (SEPSIS)
1000.0000 mL | Freq: Once | INTRAVENOUS | Status: AC
  Administered 2017-04-02: 1000 mL via INTRAVENOUS

## 2017-04-02 MED ORDER — MECLIZINE HCL 25 MG PO TABS
25.0000 mg | ORAL_TABLET | Freq: Once | ORAL | Status: AC
  Administered 2017-04-02: 25 mg via ORAL
  Filled 2017-04-02: qty 1

## 2017-04-02 MED ORDER — MECLIZINE HCL 25 MG PO TABS: 25.0000 mg | ORAL_TABLET | Freq: Three times a day (TID) | ORAL | 0 refills | Status: DC | PRN

## 2017-04-02 MED ORDER — PROMETHAZINE HCL 25 MG/ML IJ SOLN
25.0000 mg | Freq: Once | INTRAMUSCULAR | Status: DC
Start: 2017-04-02 — End: 2017-04-02

## 2017-04-02 NOTE — ED Notes (Signed)
Patient transported to CT 

## 2017-04-02 NOTE — Discharge Instructions (Signed)
Return here as needed.  Follow-up with your primary doctor. °

## 2017-04-02 NOTE — ED Triage Notes (Signed)
CP, nausea, dizziness, lightheadedness onset tonight. Pt reports headaches and sensitivity to light.

## 2017-04-02 NOTE — ED Notes (Signed)
Pt ambulated to restroom with RN assistance.  °

## 2017-04-03 NOTE — ED Notes (Signed)
Pt. Called this am and requested a work note.   Informed her that the note would be at [facility].

## 2017-04-07 ENCOUNTER — Ambulatory Visit (INDEPENDENT_AMBULATORY_CARE_PROVIDER_SITE_OTHER): Payer: Commercial Managed Care - PPO | Admitting: Medical

## 2017-04-07 ENCOUNTER — Encounter: Payer: Self-pay | Admitting: Medical

## 2017-04-07 VITALS — Wt 233.0 lb

## 2017-04-07 DIAGNOSIS — R350 Frequency of micturition: Secondary | ICD-10-CM | POA: Diagnosis not present

## 2017-04-07 DIAGNOSIS — R110 Nausea: Secondary | ICD-10-CM | POA: Diagnosis not present

## 2017-04-07 DIAGNOSIS — R319 Hematuria, unspecified: Secondary | ICD-10-CM

## 2017-04-07 DIAGNOSIS — R42 Dizziness and giddiness: Secondary | ICD-10-CM | POA: Diagnosis not present

## 2017-04-07 DIAGNOSIS — H6121 Impacted cerumen, right ear: Secondary | ICD-10-CM

## 2017-04-07 DIAGNOSIS — H6502 Acute serous otitis media, left ear: Secondary | ICD-10-CM | POA: Diagnosis not present

## 2017-04-07 LAB — POCT URINALYSIS DIP (PROADVANTAGE DEVICE)
BILIRUBIN UA: NEGATIVE
BILIRUBIN UA: NEGATIVE mg/dL
GLUCOSE UA: NEGATIVE mg/dL
LEUKOCYTES UA: NEGATIVE
Nitrite, UA: NEGATIVE
Protein Ur, POC: NEGATIVE mg/dL
Specific Gravity, Urine: 1.02
Urobilinogen, Ur: NEGATIVE
pH, UA: 6.5 (ref 5.0–8.0)

## 2017-04-07 MED ORDER — AMOXICILLIN 875 MG PO TABS: 875.0000 mg | ORAL_TABLET | Freq: Two times a day (BID) | ORAL | 0 refills | Status: DC

## 2017-04-07 MED ORDER — ONDANSETRON HCL 4 MG PO TABS: 4.0000 mg | ORAL_TABLET | Freq: Three times a day (TID) | ORAL | 0 refills | Status: DC | PRN

## 2017-04-07 MED ORDER — PREDNISONE 20 MG PO TABS: ORAL_TABLET | ORAL | 0 refills | Status: DC

## 2017-04-07 NOTE — ED Provider Notes (Signed)
[facility] DEPT Provider Note   CSN: [id number] Arrival date & time: 04/02/17  0105     History   Chief Complaint Chief Complaint  Patient presents with  . Chest Pain  . Nausea    HPI Rebecca Soto is a 46 y.o. female.  HPI  Patient presents to the emergency department with chest pain, nausea, lightheadedness, dizziness with headache and light sensitivity that started last night.  Patient states that she has been having these symptoms over the last 8 hours.  Patient states that nothing seems make the condition better.  She states mainly she is having headache as her main complaint at this time.  Patient states that she does have light sensitivity which makes her headache worse.  Patient does have a history of headaches.  The patient denies shortness of breath, blurred vision, neck pain, fever, cough, weakness, numbness,anorexia, edema, abdominal pain, nausea, vomiting, diarrhea, rash, back pain, dysuria, hematemesis, bloody stool, near syncope, or syncope. Past Medical History:  Diagnosis Date  . Asthma   . Goiter     Patient Active Problem List   Diagnosis Date Noted  . Abdominal pain 06/07/2016  . Acute right-sided low back pain without sciatica 06/07/2016  . Urinary frequency 06/07/2016  . History of renal stone 06/07/2016  . Hypothyroidism 06/07/2016  . Other and unspecified ovarian cyst 01/12/2013  . Trichimoniasis 12/27/2012  . Unspecified symptom associated with female genital organs 12/21/2012    Past Surgical History:  Procedure Laterality Date  . ABDOMINAL HYSTERECTOMY      OB History    No data available       Home Medications    Prior to Admission medications   Medication Sig Start Date End Date Taking? Authorizing Provider  acetaminophen (TYLENOL) 325 MG tablet Take 650 mg by mouth every 6 (six) hours as needed for mild pain.   Yes [provider]  albuterol (PROVENTIL HFA;VENTOLIN HFA) 108 (90 Base) MCG/ACT inhaler Inhale 2 puffs  into the lungs every 6 (six) hours as needed for wheezing or shortness of breath. 07/04/16  Yes Tysinger, Camelia Eng, PA-C  budesonide-formoterol (SYMBICORT) 160-4.5 MCG/ACT inhaler Inhale 2 puffs into the lungs 2 (two) times daily.   Yes [provider]  cetirizine (ZYRTEC) 10 MG tablet Take 10 mg by mouth daily.   Yes [provider]  montelukast (SINGULAIR) 10 MG tablet Take 1 tablet (10 mg total) by mouth at bedtime. 02/12/17  Yes Tanda Rockers, MD  amoxicillin (AMOXIL) 875 MG tablet Take 1 tablet (875 mg total) by mouth 2 (two) times daily. 04/07/17   Tysinger, Camelia Eng, PA-C  esomeprazole (NEXIUM) 40 MG capsule Take 1 capsule (40 mg total) by mouth daily at 12 noon. Patient not taking: Reported on 04/02/2017 03/03/17   Marshell Garfinkel, MD  meclizine (ANTIVERT) 25 MG tablet Take 1 tablet (25 mg total) by mouth 3 (three) times daily as needed for dizziness. 04/02/17   Shamari Trostel, Harrell Gave, PA-C  ondansetron (ZOFRAN) 4 MG tablet Take 1 tablet (4 mg total) by mouth every 8 (eight) hours as needed for nausea or vomiting. 04/07/17   Tysinger, Camelia Eng, PA-C  predniSONE (DELTASONE) 20 MG tablet 1 tablet daily for 3 days 04/07/17   Tysinger, Camelia Eng, PA-C    Family History Family History  Problem Relation Age of Onset  . Cancer Other   . Hypertension Other   . Hyperlipidemia Other   . Hypertension Mother   . Hypertension Father   . Hypertension Brother   .  Diabetes Maternal Grandmother   . Hypertension Maternal Grandfather     Social History Social History  Substance Use Topics  . Smoking status: Never Smoker  . Smokeless tobacco: Never Used  . Alcohol use Yes     Comment: occassionally     Allergies   Other; Iohexol; Aspirin; Chlorpheniramine; Diphenhydramine hcl; and Latex   Review of Systems Review of Systems All other systems negative except as documented in the HPI. All pertinent positives and negatives as reviewed in the HPI.  Physical Exam Updated Vital  Signs BP (!) 143/91   Pulse 72   Temp 98.2 F (36.8 C)   Resp 16   SpO2 100%   Physical Exam  Constitutional: She is oriented to person, place, and time. She appears well-developed and well-nourished. No distress.  HENT:  Head: Normocephalic and atraumatic.  Mouth/Throat: Oropharynx is clear and moist.  Eyes: Pupils are equal, round, and reactive to light.  Neck: Normal range of motion. Neck supple.  Cardiovascular: Normal rate, regular rhythm and normal heart sounds.  Exam reveals no gallop and no friction rub.   No murmur heard. Pulmonary/Chest: Effort normal and breath sounds normal. No respiratory distress. She has no wheezes.  Abdominal: Soft. Bowel sounds are normal. She exhibits no distension. There is no tenderness.  Neurological: She is alert and oriented to person, place, and time. She has normal strength. No sensory deficit. She exhibits normal muscle tone. Coordination and gait normal. GCS eye subscore is 4. GCS verbal subscore is 5. GCS motor subscore is 6.  Skin: Skin is warm and dry. Capillary refill takes less than 2 seconds. No rash noted. No erythema.  Psychiatric: She has a normal mood and affect. Her behavior is normal.  Nursing note and vitals reviewed.    ED Treatments / Results  Labs (all labs ordered are listed, but only abnormal results are displayed) Labs Reviewed  BASIC METABOLIC PANEL - Abnormal; Notable for the following:       Result Value   Potassium 3.0 (*)    Glucose, Bld 126 (*)    Creatinine, Ser 1.03 (*)    Calcium 8.5 (*)    All other components within normal limits  URINALYSIS, ROUTINE W REFLEX MICROSCOPIC - Abnormal; Notable for the following:    Color, Urine STRAW (*)    All other components within normal limits  CBC  I-STAT TROPONIN, ED    EKG  EKG Interpretation  Date/Time:  Wednesday April 02 2017 01:15:56 EDT Ventricular Rate:  85 PR Interval:  192 QRS Duration: 92 QT Interval:  374 QTC Calculation: 445 R  Axis:   -13 Text Interpretation:  Normal sinus rhythm Normal ECG nonspecific ST changes Confirmed by Merrily Pew [phone]) on 04/02/2017 7:01:58 AM Also confirmed by Veryl Speak [id number]), editor Philomena Doheny [phone])  on 04/02/2017 8:04:12 AM       Radiology No results found.  Procedures Procedures (including critical care time)  Medications Ordered in ED Medications  sodium chloride 0.9 % bolus 1,000 mL (0 mLs Intravenous Stopped 04/02/17 1108)  meclizine (ANTIVERT) tablet 25 mg (25 mg Oral Given 04/02/17 0941)     Initial Impression / Assessment and Plan / ED Course  I have reviewed the triage vital signs and the nursing notes.  Pertinent labs & imaging results that were available during my care of the patient were reviewed by me and considered in my medical decision making (see chart for details).     Patient's chest pain is pretty atypical  and her main complaint was the headache, the chest pain has been constant and certain positions do seem to make the pain worse.  She states that her main issue now the headache with the light sensitivity.  She has no neurological deficits noted on exam.  Patient is feeling better following IV medications for migraine type headache.  Patient was ambulated and does not have any symptoms while ambulating, such as lightheadedness or dizziness  Final Clinical Impressions(s) / ED Diagnoses   Final diagnoses:  Benign paroxysmal positional vertigo of left ear    New Prescriptions Discharge Medication List as of 04/02/2017 11:45 AM    START taking these medications   Details  meclizine (ANTIVERT) 25 MG tablet Take 1 tablet (25 mg total) by mouth 3 (three) times daily as needed for dizziness., Starting Wed 04/02/2017, Print         Trevaughn Schear, [place], PA-C 04/07/17 1416    Mesner, Corene Cornea, MD 04/07/17 1538

## 2017-04-07 NOTE — Progress Notes (Signed)
Subjective: Chief Complaint  Patient presents with  . Hospitalization Follow-up    hospital follow up vertigo    Here for hospital f/u.  Went to [facility] last week for dizziness, room spinning around sensation, ringing in ear, nausea, and had experienced some vomiting and diarrhea.   She vomited several times, but reported some chest pain at the time.   Ended up having CT head, CXR, labs.   Was put on meclizine, diagnosed with vertigo.  Advised to f/u here.   She notes since last week she has had improvement, but she still reports significant dizziness, some nausea, but no more vomiting or diarrhea.    Had visual blurriness last week with the symptoms but this has improved as well.    She also notes urinary frequency.   Denies hearing loss.   No urine odor, no blood in urine, no lower abdomen or back pain.  No other aggravating or relieving factors. No other complaint.   Past Medical History:  Diagnosis Date  . Asthma   . Goiter    Current Outpatient Prescriptions on File Prior to Visit  Medication Sig Dispense Refill  . acetaminophen (TYLENOL) 325 MG tablet Take 650 mg by mouth every 6 (six) hours as needed for mild pain.    Marland Kitchen albuterol (PROVENTIL HFA;VENTOLIN HFA) 108 (90 Base) MCG/ACT inhaler Inhale 2 puffs into the lungs every 6 (six) hours as needed for wheezing or shortness of breath. 1 Inhaler 1  . budesonide-formoterol (SYMBICORT) 160-4.5 MCG/ACT inhaler Inhale 2 puffs into the lungs 2 (two) times daily.    . meclizine (ANTIVERT) 25 MG tablet Take 1 tablet (25 mg total) by mouth 3 (three) times daily as needed for dizziness. 30 tablet 0  . montelukast (SINGULAIR) 10 MG tablet Take 1 tablet (10 mg total) by mouth at bedtime. 30 tablet 3  . cetirizine (ZYRTEC) 10 MG tablet Take 10 mg by mouth daily.    Marland Kitchen esomeprazole (NEXIUM) 40 MG capsule Take 1 capsule (40 mg total) by mouth daily at 12 noon. (Patient not taking: Reported on 04/02/2017) 30 capsule 5   No current  facility-administered medications on file prior to visit.    ROS as in subjective   Objective: Wt 233 lb (105.7 kg)   SpO2 98%   BMI 38.77 kg/m   General appearance: alert, no distress, WD/WN, lying on exam table due to dizziness HEENT: normocephalic, sclerae anicteric, PERRLA, EOMi, nares patent, no discharge or erythema, left TM with serous fluid and mild erythema, right ear canal with impacted cerumen, but normal TM after cerumen removed, pharynx normal Oral cavity: MMM, no lesions Neck: supple, no lymphadenopathy, no thyromegaly, no masses Heart: RRR, normal S1, S2, no murmurs Lungs: CTA bilaterally, no wheezes, rhonchi, or rales Abdomen: +bs, soft, non tender, non distended, no masses, no hepatomegaly, no splenomegaly Back: non tender Extremities: no edema, no cyanosis, no clubbing Pulses: 2+ symmetric, upper and lower extremities, normal cap refill Neurological: alert, oriented x 3, CN2-12 intact, strength normal upper extremities and lower extremities, sensation normal throughout, DTRs 2+ throughout, +some off balance with cerebellar signs, slow with ambulation given dizziness. Psychiatric: normal affect, behavior normal, pleasant    Assessment: Encounter Diagnoses  Name Primary?  . Dizziness Yes  . Vertigo   . Acute serous otitis media of left ear, recurrence not specified   . Impacted cerumen of right ear   . Hematuria, unspecified type   . Urinary frequency   . Nausea  Plan: reviewed hospital ED notes, CXR, CT head, labs, urinalysis.     Discussed possible causes of vertigo/dizziness.  She has left ear redness and possible infection of inner ear, has high pH and urinary frequency, and given symptoms, will begin Amoxicillin for possible UTI/serous otitis media, begin prednisone for vertigo not much improved on meclizine, can use Zofran prn for nausea.   Limit motion if dizzy.  Gave work note through 04/10/17.    Advised f/u 1wk.  Discussed findings.  Discussed  risk/benefits of procedure and patient agrees to procedure. Successfully used warm water lavage to remove impacted cerumen from right ear canal. Patient tolerated procedure well. Advised they avoid using any cotton swabs or other devices to clean the ear canals.  Use basic hygiene as discussed.  Follow up prn.   [name] was seen today for hospitalization follow-up.  Diagnoses and all orders for this visit:  Dizziness  Vertigo  Acute serous otitis media of left ear, recurrence not specified  Impacted cerumen of right ear  Hematuria, unspecified type -     POCT Urinalysis DIP (Proadvantage Device) -     Urine Culture; Future -     Urine Culture  Urinary frequency  Nausea  Other orders -     predniSONE (DELTASONE) 20 MG tablet; 1 tablet daily for 3 days -     ondansetron (ZOFRAN) 4 MG tablet; Take 1 tablet (4 mg total) by mouth every 8 (eight) hours as needed for nausea or vomiting. -     amoxicillin (AMOXIL) 875 MG tablet; Take 1 tablet (875 mg total) by mouth 2 (two) times daily.

## 2017-04-08 LAB — URINE CULTURE
MICRO NUMBER:: 81023802
Result:: NO GROWTH
SPECIMEN QUALITY:: ADEQUATE

## 2017-06-16 ENCOUNTER — Other Ambulatory Visit: Payer: Self-pay | Admitting: Medical

## 2017-06-16 DIAGNOSIS — Z1231 Encounter for screening mammogram for malignant neoplasm of breast: Secondary | ICD-10-CM

## 2017-07-16 ENCOUNTER — Ambulatory Visit
Admission: RE | Admit: 2017-07-16 | Discharge: 2017-07-16 | Disposition: A | Discharge status: Home / Self Care | Payer: Commercial Managed Care - PPO | Source: Ambulatory Visit | Attending: Medical | Admitting: Medical

## 2017-07-16 DIAGNOSIS — Z1231 Encounter for screening mammogram for malignant neoplasm of breast: Secondary | ICD-10-CM

## 2017-07-22 DIAGNOSIS — I10 Essential (primary) hypertension: Secondary | ICD-10-CM

## 2017-07-22 HISTORY — DX: Essential (primary) hypertension: I10

## 2017-08-12 ENCOUNTER — Other Ambulatory Visit: Payer: Self-pay | Admitting: Internal Medicine

## 2017-08-17 ENCOUNTER — Encounter (HOSPITAL_COMMUNITY): Payer: Self-pay | Admitting: Emergency Medicine

## 2017-08-17 ENCOUNTER — Ambulatory Visit (HOSPITAL_COMMUNITY)
Admission: EM | Admit: 2017-08-17 | Discharge: 2017-08-17 | Disposition: A | Discharge status: Home / Self Care | Payer: Commercial Managed Care - PPO | Attending: Family Medicine | Admitting: Family Medicine

## 2017-08-17 DIAGNOSIS — Z79899 Other long term (current) drug therapy: Secondary | ICD-10-CM | POA: Insufficient documentation

## 2017-08-17 DIAGNOSIS — J45909 Unspecified asthma, uncomplicated: Secondary | ICD-10-CM | POA: Diagnosis not present

## 2017-08-17 DIAGNOSIS — Z886 Allergy status to analgesic agent status: Secondary | ICD-10-CM | POA: Insufficient documentation

## 2017-08-17 DIAGNOSIS — Z79890 Hormone replacement therapy (postmenopausal): Secondary | ICD-10-CM | POA: Insufficient documentation

## 2017-08-17 DIAGNOSIS — R05 Cough: Secondary | ICD-10-CM | POA: Diagnosis present

## 2017-08-17 DIAGNOSIS — J0100 Acute maxillary sinusitis, unspecified: Secondary | ICD-10-CM

## 2017-08-17 DIAGNOSIS — N760 Acute vaginitis: Secondary | ICD-10-CM

## 2017-08-17 DIAGNOSIS — N898 Other specified noninflammatory disorders of vagina: Secondary | ICD-10-CM | POA: Diagnosis present

## 2017-08-17 MED ORDER — FLUCONAZOLE 150 MG PO TABS: 150.0000 mg | ORAL_TABLET | Freq: Once | ORAL | 0 refills | Status: AC

## 2017-08-17 MED ORDER — AMOXICILLIN 875 MG PO TABS: 875.0000 mg | ORAL_TABLET | Freq: Two times a day (BID) | ORAL | 0 refills | Status: DC

## 2017-08-17 MED ORDER — FLUTICASONE PROPIONATE 50 MCG/ACT NA SUSP: 2.0000 | Freq: Every day | NASAL | 12 refills | Status: DC

## 2017-08-17 NOTE — ED Triage Notes (Signed)
PT reports productive cough with sinus pain for 5 days. PT reports vaginal discharge without odor for 3 days.

## 2017-08-17 NOTE — ED Provider Notes (Signed)
[place]   [id number] 08/17/17 Arrival Time: 1206   SUBJECTIVE:  Rebecca Soto is a 47 y.o. female who presents to the urgent care with complaint of productive cough with sinus pain for 5 days. PT reports vaginal discharge without odor for 3 days.   Patient has been taking cough medicine and using Vicks VapoRub.  She notes that when she takes cough medicine she frequently gets a yeast infection.  Works at [employer].  Past Medical History:  Diagnosis Date  . Asthma   . Goiter    Family History  Problem Relation Age of Onset  . Cancer Other   . Hypertension Other   . Hyperlipidemia Other   . Hypertension Mother   . Hypertension Father   . Hypertension Brother   . Diabetes Maternal Grandmother   . Hypertension Maternal Grandfather    Social History   Socioeconomic History  . Marital status: Single    Spouse name: Not on file  . Number of children: Not on file  . Years of education: Not on file  . Highest education level: Not on file  Social Needs  . Financial resource strain: Not on file  . Food insecurity - worry: Not on file  . Food insecurity - inability: Not on file  . Transportation needs - medical: Not on file  . Transportation needs - non-medical: Not on file  Occupational History  . Not on file  Tobacco Use  . Smoking status: Never Smoker  . Smokeless tobacco: Never Used  Substance and Sexual Activity  . Alcohol use: Yes    Comment: occassionally  . Drug use: No  . Sexual activity: Yes    Birth control/protection: Surgical  Other Topics Concern  . Not on file  Social History Narrative  . Not on file   Current Meds  Medication Sig  . albuterol (PROVENTIL HFA;VENTOLIN HFA) 108 (90 Base) MCG/ACT inhaler Inhale 2 puffs into the lungs every 6 (six) hours as needed for wheezing or shortness of breath.  . budesonide-formoterol (SYMBICORT) 160-4.5 MCG/ACT inhaler Inhale 2 puffs into the lungs 2 (two) times daily.  Marland Kitchen esomeprazole (NEXIUM)  40 MG capsule Take 1 capsule (40 mg total) by mouth daily at 12 noon.  Marland Kitchen levothyroxine (SYNTHROID, LEVOTHROID) 75 MCG tablet Take 75 mcg by mouth daily before breakfast.  . montelukast (SINGULAIR) 10 MG tablet Take 1 tablet (10 mg total) by mouth at bedtime.   Allergies  Allergen Reactions  . Other Anaphylaxis    Artichokes  . Iohexol Hives  . Aspirin Other (See Comments)    Dry throat and coughing   . Chlorpheniramine Itching  . Diphenhydramine Hcl Other (See Comments)    Unknown  . Latex       ROS: As per HPI, remainder of ROS negative.   OBJECTIVE:   Vitals:   08/17/17 1225 08/17/17 1226  BP: (!) 138/92   Pulse: 98   Resp: 16   Temp: 98.8 F (37.1 C)   TempSrc: Oral   SpO2: 99%   Weight:  255 lb (115.7 kg)  Height:  5\' 5"  (1.651 m)     General appearance: alert; no distress Eyes: PERRL; EOMI; conjunctiva normal HENT: normocephalic; atraumatic; TMs normal, canal normal, external ears normal without trauma; nasal mucosa swollen with exudative discharge; oral mucosa normal Neck: supple Lungs: clear to auscultation bilaterally Heart: regular rate and rhythm Abdomen: soft, non-tender; bowel sounds normal; no masses or organomegaly; no guarding or rebound tenderness Back: no CVA  tenderness Extremities: no cyanosis or edema; symmetrical with no gross deformities Skin: warm and dry Neurologic: normal gait; grossly normal Psychological: alert and cooperative; normal mood and affect      Labs:  Results for orders placed or performed in visit on 04/07/17  Urine Culture  Result Value Ref Range   MICRO NUMBER: [id number]    SPECIMEN QUALITY: ADEQUATE    Sample Source URINE    STATUS: FINAL    Result: No Growth   POCT Urinalysis DIP (Proadvantage Device)  Result Value Ref Range   Color, UA yellow yellow   Clarity, UA clear clear   Glucose, UA negative negative mg/dL   Bilirubin, UA negative negative   Ketones, POC UA negative negative mg/dL   Specific  Gravity, Urine 1.020    Blood, UA small (A) negative   pH, UA 6.5 5.0 - 8.0   Protein Ur, POC negative negative mg/dL   Urobilinogen, Ur neg    Nitrite, UA Negative Negative   Leukocytes, UA Negative Negative    Labs Reviewed  URINE CYTOLOGY ANCILLARY ONLY    No results found.     ASSESSMENT & PLAN:  1. Acute maxillary sinusitis, recurrence not specified   2. Vaginitis and vulvovaginitis     Meds ordered this encounter  Medications  . amoxicillin (AMOXIL) 875 MG tablet    Sig: Take 1 tablet (875 mg total) by mouth 2 (two) times daily.    Dispense:  20 tablet    Refill:  0  . fluticasone (FLONASE) 50 MCG/ACT nasal spray    Sig: Place 2 sprays into both nostrils daily.    Dispense:  16 g    Refill:  12  . fluconazole (DIFLUCAN) 150 MG tablet    Sig: Take 1 tablet (150 mg total) by mouth once for 1 dose. Repeat if needed    Dispense:  2 tablet    Refill:  0    Reviewed expectations re: course of current medical issues. Questions answered. Outlined signs and symptoms indicating need for more acute intervention. Patient verbalized understanding. After Visit Summary given.    Procedures:      Robyn Haber, MD 08/17/17 1241

## 2017-08-18 LAB — URINE CYTOLOGY ANCILLARY ONLY
Chlamydia: NEGATIVE
Neisseria Gonorrhea: NEGATIVE
Trichomonas: NEGATIVE

## 2017-08-21 LAB — URINE CYTOLOGY ANCILLARY ONLY
Bacterial vaginitis: NEGATIVE
Candida vaginitis: NEGATIVE

## 2017-08-22 ENCOUNTER — Ambulatory Visit: Payer: Commercial Managed Care - PPO | Admitting: Family Medicine

## 2017-08-22 ENCOUNTER — Encounter: Payer: Self-pay | Admitting: Family Medicine

## 2017-08-22 VITALS — BP 126/80 | HR 98 | Temp 98.7°F | Wt 246.0 lb

## 2017-08-22 DIAGNOSIS — B001 Herpesviral vesicular dermatitis: Secondary | ICD-10-CM

## 2017-08-22 HISTORY — DX: Herpesviral vesicular dermatitis: B00.1

## 2017-08-22 MED ORDER — VALACYCLOVIR HCL 1 G PO TABS: ORAL_TABLET | ORAL | 2 refills | Status: DC

## 2017-08-22 NOTE — Progress Notes (Signed)
   Subjective:    Patient ID: Rebecca Soto, female    DOB: 01-12-1971, 47 y.o.   MRN: [id number]  HPI Chief Complaint  Patient presents with  . cold and cold sore    cold sore,  congestion    Complains of a 3 day history of a cold sore to her upper lip.  Denies history of cold sores.  States symptoms started with tingling and burning on her upper lip and then a blister developed.  No other sores or oral lesions.  She has been sick with upper respiratory infection and is being treated for this.  Those symptoms are improving. Denies fever, chills, chest pain, shortness of breath, nausea, vomiting, diarrhea.  She has been using Abreva.    Review of Systems Pertinent positives and negatives in the history of present illness.     Objective:   Physical Exam BP 126/80   Pulse 98   Temp 98.7 F (37.1 C)   Wt 246 lb (111.6 kg)   SpO2 97%   BMI 40.94 kg/m   Blister that is in healing stages on her upper lip, some scabbing but covered with a white ointment currently. Tender. No drainage or sign of secondary bacterial infection. Normal oral exam otherwise.       Assessment & Plan:  Herpes labialis - Plan: valACYclovir (VALTREX) 1000 MG tablet  She will continue using Abreva since she is out from the window for oral valacyclovir.  This is her first cold sore outbreak so I spent several minutes educating her about why this happened and management. Discussed potentially trying Zovirax ointment versus oral valacyclovir with her next outbreak if she has one.  We opted to do oral valacyclovir and I educated her on correct dosing of this. Discussed contagious nature.  She will discard her toothbrush and lipstick or medication she has been using on her lip.  If she starts having frequent outbreaks of this she will return for blood work but apparently she has a good immune system currently.

## 2017-08-22 NOTE — Patient Instructions (Signed)
Continue using Abreva.  Avoid kissing or drinking after people until completely healed.   If you have symptoms of another outbreak, take the oral medication right away as prescribed.   Cold Sore A cold sore, also called a fever blister, is a skin infection that causes small, fluid-filled sores to form inside of the mouth or on the lips, gums, nose, chin, or cheeks. Cold sores can spread to other parts of the body, such as the eyes or fingers. In some people with other medical conditions, cold sores can spread to multiple other body sites, including the genitals. Cold sores can be spread or passed from person to person (contagious) until the sores crust over completely. What are the causes? Cold sores are caused by the herpes simplex virus (HSV-1). HSV-1 is closely related to the virus that causes genital herpes (HSV-2), but these viruses are not the same. Once a person is infected with HSV-1, the virus remains permanently in the body. HSV-1 is spread from person to person through close contact, such as through kissing, touching the affected area, or sharing personal items such as lip balm, razors, or eating utensils. What increases the risk? A cold sore outbreak is more likely to develop in people who:  Are tired, stressed, or sick.  Are menstruating.  Are pregnant.  Take certain medicines.  Are exposed to cold weather or too much sun.  What are the signs or symptoms? Symptoms of a cold sore outbreak often go through different stages. Here is how a cold sore develops:  Tingling, itching, or burning is felt 1-2 days before the outbreak.  Fluid-filled blisters appear on the lips, inside the mouth, on the nose, or on the cheeks.  The blisters start to ooze clear fluid.  The blisters dry up and a yellow crust appears in its place.  The crust falls off.  Other symptoms include:  Fever.  Sore throat.  Headache.  Muscle aches.  Swollen neck glands.  You also may not have any  symptoms. How is this diagnosed? This condition is often diagnosed based on your medical history and a physical exam. Your health care provider may swab your sore and then examine it in the lab. Rarely, blood tests may be done to check for HSV-1. How is this treated? There is no cure for cold sores or HSV-1. There also is no vaccine for HSV-1. Most cold sores go away on their own without treatment within two weeks. Medicines cannot make the infection go away, but medicines can:  Help relieve some of the pain associated with the sores.  Work to stop the virus from multiplying.  Shorten healing time.  Medicines may be in the form of creams, gels, pills, or a shot. Follow these instructions at home: Medicines  Take or apply over-the-counter and prescription medicines only as told by your health care provider.  Use a cotton-tip swab to apply creams or gels to your sores. Sore Care  Do not touch the sores or pick the scabs.  Wash your hands often. Do not touch your eyes without washing your hands first.  Keep the sores clean and dry.  If directed, apply ice to the sores: ? Put ice in a plastic bag. ? Place a towel between your skin and the bag. ? Leave the ice on for 20 minutes, 2-3 times per day. Lifestyle  Do not kiss, have oral sex, or share personal items until your sores heal.  Eat a soft, bland diet. Avoid eating hot, cold,  or salty foods. These can hurt your mouth.  Use a straw if it hurts to drink out of a glass.  Avoid the sun and limit your stress if these things trigger outbreaks. If sun causes cold sores, apply sunscreen on your lips before being out in the sun. Contact a health care provider if:  You have symptoms for more than two weeks.  You have pus coming from the sores.  You have redness that is spreading.  You have pain or irritation in your eye.  You get sores on your genitals.  Your sores do not heal within two weeks.  You have frequent cold sore  outbreaks. Get help right away if:  You have a fever and your symptoms suddenly get worse.  You have a headache and confusion. This information is not intended to replace advice given to you by your health care provider. Make sure you discuss any questions you have with your health care provider. Document Released: 07/05/2000 Document Revised: 03/01/2016 Document Reviewed: 04/28/2015 Elsevier Interactive Patient Education  [employer].

## 2017-10-13 ENCOUNTER — Ambulatory Visit: Payer: Commercial Managed Care - PPO | Admitting: Medical

## 2017-10-13 ENCOUNTER — Encounter: Payer: Self-pay | Admitting: Medical

## 2017-10-13 VITALS — BP 138/88 | HR 86 | Temp 98.6°F | Ht 65.0 in | Wt 243.6 lb

## 2017-10-13 DIAGNOSIS — B349 Viral infection, unspecified: Secondary | ICD-10-CM

## 2017-10-13 DIAGNOSIS — J4541 Moderate persistent asthma with (acute) exacerbation: Secondary | ICD-10-CM

## 2017-10-13 DIAGNOSIS — J301 Allergic rhinitis due to pollen: Secondary | ICD-10-CM

## 2017-10-13 MED ORDER — BUDESONIDE-FORMOTEROL FUMARATE 160-4.5 MCG/ACT IN AERO: 2.0000 | INHALATION_SPRAY | Freq: Two times a day (BID) | RESPIRATORY_TRACT | 5 refills | Status: DC

## 2017-10-13 MED ORDER — PREDNISONE 10 MG PO TABS: ORAL_TABLET | ORAL | 0 refills | Status: DC

## 2017-10-13 MED ORDER — FLUTICASONE PROPIONATE 50 MCG/ACT NA SUSP: 2.0000 | Freq: Every day | NASAL | 12 refills | Status: DC

## 2017-10-13 MED ORDER — MONTELUKAST SODIUM 10 MG PO TABS: 10.0000 mg | ORAL_TABLET | Freq: Every day | ORAL | 1 refills | Status: DC

## 2017-10-13 MED ORDER — CETIRIZINE HCL 10 MG PO TABS: 10.0000 mg | ORAL_TABLET | Freq: Every day | ORAL | 1 refills | Status: DC

## 2017-10-13 MED ORDER — ALBUTEROL SULFATE HFA 108 (90 BASE) MCG/ACT IN AERS: 2.0000 | INHALATION_SPRAY | Freq: Four times a day (QID) | RESPIRATORY_TRACT | 1 refills | Status: DC | PRN

## 2017-10-13 NOTE — Patient Instructions (Addendum)
Recommendations:  Your current symptoms and exam findings suggest viral illness such as flu or other viral cold flaring up your asthma  I recommend rest, hydrate well with water or other clear fluids, and you can use Tylenol or ibuprofen for fever and body aches and chills  For asthma and allergy symptoms and to improve your symptoms:  continue Symbicort 2 puffs twice daily for prevention:  Continue albuterol rescue inhaler 2 puffs every 4-6 hours as needed  For allergies continue Singulair and cetirizine daily at bedtime.  Begin back on cetirizine also known as Zyrtec  You can use Flonase nasal spray if needed for nasal congestion and sneezing  If your breathing does not improve over the next few days with the above recommendations, you can add prednisone oral taper steroid to reduce your asthma symptoms  If not much improved in the next few days or worsening call back

## 2017-10-13 NOTE — Progress Notes (Signed)
Subjective: Chief Complaint  Patient presents with  . Acute Visit    chest congestion hard to breath, coughing, runny nose/stuffy nose, had fever    Here for chest congestion, cough, some dyspnea, head pressure some night sweats.  symptoms started several days ago.   Cough is productive, slight yellowish tint.   Felt feverish last few nights up to 102.  Has had sore throat.   No NVD.  Has felt SOB.  Inhaler is not helping.  Using Symbicort inhaler, betting ready to run out.  No swelling, no chest pain, but is achy.   Has several work sick contacts.   Using mucinex for symptoms.   Nonsmoker.  Has a dog, shitzu.   No other aggravating or relieving factors. No other complaint.  Past Medical History:  Diagnosis Date  . Asthma   . Goiter   . Herpes labialis 08/22/2017   Current Outpatient Medications on File Prior to Visit  Medication Sig Dispense Refill  . acetaminophen (TYLENOL) 325 MG tablet Take 650 mg by mouth every 6 (six) hours as needed for mild pain.    Marland Kitchen levothyroxine (SYNTHROID, LEVOTHROID) 75 MCG tablet Take 75 mcg by mouth daily before breakfast.    . valACYclovir (VALTREX) 1000 MG tablet Take 2,000 mg by mouth q12h x 1 day. Start ASAP at onset of symptoms. 30 tablet 2  . amoxicillin (AMOXIL) 875 MG tablet Take 1 tablet (875 mg total) by mouth 2 (two) times daily. (Patient not taking: Reported on 10/13/2017) 20 tablet 0   No current facility-administered medications on file prior to visit.    ROS as in subjective    Objective BP 138/88 (BP Location: Right Arm, Patient Position: Sitting, Cuff Size: Normal)   Pulse 86   Temp 98.6 F (37 C) (Oral)   Ht 5\' 5"  (1.651 m)   Wt 243 lb 9.6 oz (110.5 kg)   SpO2 96%   BMI 40.54 kg/m   Wt Readings from Last 3 Encounters:  10/13/17 243 lb 9.6 oz (110.5 kg)  08/22/17 246 lb (111.6 kg)  08/17/17 255 lb (115.7 kg)   General appearance: Alert, WD/WN, no distress, ill appearing                             Skin: warm, no rash, no  diaphoresis                           Head: no sinus tenderness                            Eyes: conjunctiva normal, corneas clear, PERRLA                            Ears: pearly TMs, external ear canals normal                          Nose: septum midline, turbinates swollen, with erythema and clear discharge             Mouth/throat: MMM, tongue normal, mild pharyngeal erythema                           Neck: supple, no adenopathy, no thyromegaly, non tender  Heart: RRR, normal S1, S2, no murmurs                         Lungs: +bronchial breath sounds, no rhonchi, no wheezes, no rales                Extremities: no edema, non tender       Assessment: Encounter Diagnoses  Name Primary?  . Viral illness Yes  . Moderate persistent asthma with acute exacerbation   . Allergic rhinitis due to pollen, unspecified seasonality      Plan: We discussed her symptoms and exam findings to suggest viral illness, and several flu exposures at work complicated by spring allergy season starting.  Patient Instructions  Recommendations:  Your current symptoms and exam findings suggest viral illness such as flu or other viral cold flaring up your asthma  I recommend rest, hydrate well with water or other clear fluids, and you can use Tylenol or ibuprofen for fever and body aches and chills  For asthma and allergy symptoms and to improve your symptoms:  continue Symbicort 2 puffs twice daily for prevention:  Continue albuterol rescue inhaler 2 puffs every 4-6 hours as needed  For allergies continue Singulair and cetirizine daily at bedtime.  Begin back on cetirizine also known as Zyrtec  You can use Flonase nasal spray if needed for nasal congestion and sneezing  If your breathing does not improve over the next few days with the above recommendations, you can add prednisone oral taper steroid to reduce your asthma symptoms  If not much improved in the next few days or  worsening call back    Clovis was seen today for acute visit.  Diagnoses and all orders for this visit:  Viral illness  Moderate persistent asthma with acute exacerbation  Allergic rhinitis due to pollen, unspecified seasonality  Other orders -     albuterol (PROVENTIL HFA;VENTOLIN HFA) 108 (90 Base) MCG/ACT inhaler; Inhale 2 puffs into the lungs every 6 (six) hours as needed for wheezing or shortness of breath. -     budesonide-formoterol (SYMBICORT) 160-4.5 MCG/ACT inhaler; Inhale 2 puffs into the lungs 2 (two) times daily. -     montelukast (SINGULAIR) 10 MG tablet; Take 1 tablet (10 mg total) by mouth at bedtime. -     fluticasone (FLONASE) 50 MCG/ACT nasal spray; Place 2 sprays into both nostrils daily. -     cetirizine (ZYRTEC) 10 MG tablet; Take 1 tablet (10 mg total) by mouth at bedtime. -     predniSONE (DELTASONE) 10 MG tablet; 6/5/4/3/2/1 taper

## 2017-10-30 ENCOUNTER — Ambulatory Visit: Payer: Commercial Managed Care - PPO | Admitting: Medical

## 2017-10-30 VITALS — BP 128/82 | HR 90 | Temp 98.2°F | Ht 65.5 in | Wt 249.6 lb

## 2017-10-30 DIAGNOSIS — J069 Acute upper respiratory infection, unspecified: Secondary | ICD-10-CM

## 2017-10-30 DIAGNOSIS — H6690 Otitis media, unspecified, unspecified ear: Secondary | ICD-10-CM

## 2017-10-30 MED ORDER — AMOXICILLIN 875 MG PO TABS: 875.0000 mg | ORAL_TABLET | Freq: Two times a day (BID) | ORAL | 0 refills | Status: DC

## 2017-10-30 NOTE — Progress Notes (Signed)
Subjective:  Rebecca Soto is a 47 y.o. female who presents for  Chief Complaint  Patient presents with  . Acute Visit    cough, sore throat, tried OTC medication not helping   Symptoms include 1+ week history of cough, sore throat, head congestion, initially clear production now yellow-green mucus, had low-grade fever, some sore throat.  Denies nausea vomiting, no shortness of breath, and no wheezing, but has had some ear pressure on the right.  She is compliant with her allergy and asthma medications.  +sick contacts. Patient is not a smoker. No other aggravating or relieving factors.  No other complaint.    Past Medical History:  Diagnosis Date  . Asthma   . Goiter   . Herpes labialis 08/22/2017    Current Outpatient Medications on File Prior to Visit  Medication Sig Dispense Refill  . acetaminophen (TYLENOL) 325 MG tablet Take 650 mg by mouth every 6 (six) hours as needed for mild pain.    Marland Kitchen albuterol (PROVENTIL HFA;VENTOLIN HFA) 108 (90 Base) MCG/ACT inhaler Inhale 2 puffs into the lungs every 6 (six) hours as needed for wheezing or shortness of breath. 1 Inhaler 1  . budesonide-formoterol (SYMBICORT) 160-4.5 MCG/ACT inhaler Inhale 2 puffs into the lungs 2 (two) times daily. 1 Inhaler 5  . cetirizine (ZYRTEC) 10 MG tablet Take 1 tablet (10 mg total) by mouth at bedtime. 90 tablet 1  . levothyroxine (SYNTHROID, LEVOTHROID) 75 MCG tablet Take 75 mcg by mouth daily before breakfast.    . valACYclovir (VALTREX) 1000 MG tablet Take 2,000 mg by mouth q12h x 1 day. Start ASAP at onset of symptoms. 30 tablet 2  . fluticasone (FLONASE) 50 MCG/ACT nasal spray Place 2 sprays into both nostrils daily. (Patient not taking: Reported on 10/30/2017) 16 g 12  . montelukast (SINGULAIR) 10 MG tablet Take 1 tablet (10 mg total) by mouth at bedtime. (Patient not taking: Reported on 10/30/2017) 90 tablet 1  . predniSONE (DELTASONE) 10 MG tablet 6/5/4/3/2/1 taper (Patient not taking: Reported on 10/30/2017) 21  tablet 0   No current facility-administered medications on file prior to visit.     ROS as in subjective   Objective: BP 128/82 (BP Location: Right Arm, Patient Position: Sitting, Cuff Size: Normal)   Pulse 90   Temp 98.2 F (36.8 C) (Oral)   Ht 5' 5.5" (1.664 m)   Wt 249 lb 9.6 oz (113.2 kg)   SpO2 97%   BMI 40.90 kg/m   General appearance: Alert, well developed, well nourished, no distress                             Skin: warm, no rash                           Head: no sinus tenderness,                            Eyes: conjunctiva erythematous, corneas clear                            Ears: pearly left tympanic membrane, erythematous right tympanic membrane, external ear canals normal                          Nose: septum midline, turbinates swollen, with  erythema and clear discharge             Mouth/throat: MMM, tongue normal, mild pharyngeal erythema                           Neck: supple, no adenopathy, no thyromegaly, non tender                         Lungs: clear, no wheezes, no rales, no rhonchi        Assessment  Encounter Diagnoses  Name Primary?  . Acute otitis media, unspecified otitis media type Yes  . Upper respiratory tract infection, unspecified type       Plan: Discussed diagnoses.   Discussed usual time frame to see improvement. Discussed possible complications or symptoms that would prompt call back or recheck within the next few days.     Medications prescribed:  Can c/t Tessalon for cough suppression if OTC cough medication isn't helping.   Caution advised as this may cause drowsiness.  Complete the course of amoxicillin antibiotic prescribed today.    Specific home care recommendations discussed:  Pain/fever relief: You may use over-the-counter Tylenol for pain or fever  Drink extra fluids. Fluids help thin the mucus so your sinuses can drain more easily.   Use saline nasal sprays to help moisten your sinuses. The sprays can be found at  your local drugstore.   Rebecca Soto was seen today for acute visit.  Diagnoses and all orders for this visit:  Acute otitis media, unspecified otitis media type  Upper respiratory tract infection, unspecified type  Other orders -     amoxicillin (AMOXIL) 875 MG tablet; Take 1 tablet (875 mg total) by mouth 2 (two) times daily.    Patient was advised to call or return if worse or not improving in the next few days.    Patient voiced understanding of diagnosis, recommendations, and treatment plan.Marland Kitchen

## 2017-12-30 ENCOUNTER — Other Ambulatory Visit: Payer: Self-pay | Admitting: Medical

## 2017-12-30 ENCOUNTER — Telehealth: Payer: Self-pay | Admitting: Medical

## 2017-12-30 MED ORDER — SCOPOLAMINE 1 MG/3DAYS TD PT72: 1.0000 | MEDICATED_PATCH | TRANSDERMAL | 0 refills | Status: DC

## 2017-12-30 NOTE — Telephone Encounter (Signed)
Pt states going on a cruise next week for 7 days and would like something called in for sea sickness, she would like you to call in the patches and some pills also.  She has never been before and has had vertigo in the past and is afraid of getting sick.  Uses [facility]

## 2017-12-30 NOTE — Telephone Encounter (Signed)
I sent scopolamine patch to wear behind ear, 1 patch every 3 days for motion sickness.   Since she already takes allergy medication (zyrtec and Singulair), I would continue these as they can help with motion sickness/vertigo.  Hydrate well  Also, lets have her come in for physical in a few weeks.  I don't see she has had a physical in some time

## 2018-01-01 NOTE — Telephone Encounter (Signed)
Called left message for pt

## 2018-04-17 DIAGNOSIS — Z809 Family history of malignant neoplasm, unspecified: Secondary | ICD-10-CM | POA: Insufficient documentation

## 2018-06-15 ENCOUNTER — Other Ambulatory Visit: Payer: Self-pay | Admitting: Medical

## 2018-06-15 DIAGNOSIS — Z1231 Encounter for screening mammogram for malignant neoplasm of breast: Secondary | ICD-10-CM

## 2018-07-06 ENCOUNTER — Encounter: Payer: Self-pay | Admitting: Medical

## 2018-07-06 ENCOUNTER — Ambulatory Visit: Payer: Commercial Managed Care - PPO | Admitting: Medical

## 2018-07-06 ENCOUNTER — Other Ambulatory Visit: Payer: Self-pay | Admitting: Medical

## 2018-07-06 VITALS — BP 168/100 | HR 92 | Temp 97.6°F | Ht 65.0 in | Wt 260.0 lb

## 2018-07-06 DIAGNOSIS — R42 Dizziness and giddiness: Secondary | ICD-10-CM | POA: Diagnosis not present

## 2018-07-06 DIAGNOSIS — E039 Hypothyroidism, unspecified: Secondary | ICD-10-CM

## 2018-07-06 DIAGNOSIS — R030 Elevated blood-pressure reading, without diagnosis of hypertension: Secondary | ICD-10-CM | POA: Diagnosis not present

## 2018-07-06 LAB — CBC
HEMATOCRIT: 38.7 % (ref 34.0–46.6)
Hemoglobin: 13 g/dL (ref 11.1–15.9)
MCH: 29.7 pg (ref 26.6–33.0)
MCHC: 33.6 g/dL (ref 31.5–35.7)
MCV: 89 fL (ref 79–97)
PLATELETS: 346 10*3/uL (ref 150–450)
RBC: 4.37 x10E6/uL (ref 3.77–5.28)
RDW: 13.2 % (ref 12.3–15.4)
WBC: 5 10*3/uL (ref 3.4–10.8)

## 2018-07-06 LAB — BASIC METABOLIC PANEL
BUN / CREAT RATIO: 13 (ref 9–23)
BUN: 11 mg/dL (ref 6–24)
CHLORIDE: 105 mmol/L (ref 96–106)
CO2: 28 mmol/L (ref 20–29)
Calcium: 8.9 mg/dL (ref 8.7–10.2)
Creatinine, Ser: 0.84 mg/dL (ref 0.57–1.00)
GFR calc non Af Amer: 83 mL/min/{1.73_m2} (ref >59)
GFR, EST AFRICAN AMERICAN: 96 mL/min/{1.73_m2} (ref >59)
GLUCOSE: 87 mg/dL (ref 65–99)
POTASSIUM: 3.9 mmol/L (ref 3.5–5.2)
SODIUM: 138 mmol/L (ref 134–144)

## 2018-07-06 NOTE — Patient Instructions (Signed)
Recommendations:  Check your blood pressure for the next several days.  normla is 120/70.   High would be 140/90 or higher   Specific recommendations:  Exercise regularly, preferably every day with aerobic exercise for 30 minutes or more  Avoid added salt in the diet.   Follow a DASH eating plan or Mediterranean eating plan  Hypertension, commonly called high blood pressure, is when the force of blood pumping through your arteries is too strong. Your arteries are the blood vessels that carry blood from your heart throughout your body. A blood pressure reading consists of a higher number over a lower number, such as 110/72. The higher number (systolic) is the pressure inside your arteries when your heart pumps. The lower number (diastolic) is the pressure inside your arteries when your heart relaxes. Ideally you want your blood pressure below 120/80. Hypertension forces your heart to work harder to pump blood. Your arteries may become narrow or stiff. Having hypertension puts you at risk for heart disease, stroke, and other problems.  RISK FACTORS Some risk factors for high blood pressure are controllable. Others are not.  Risk factors you cannot control include:   Race. You may be at higher risk if you are African American.  Age. Risk increases with age.  Gender. Men are at higher risk than women before age 21 years. After age 29, women are at higher risk than men. Risk factors you can control include:  Not getting enough exercise or physical activity.  Being overweight.  Getting too much fat, sugar, calories, or salt in your diet.  Drinking too much alcohol. SIGNS AND SYMPTOMS Hypertension does not usually cause signs or symptoms. Extremely high blood pressure (hypertensive crisis) may cause headache, anxiety, shortness of breath, and nosebleed. DIAGNOSIS  To check if you have hypertension, your health care provider will measure your blood pressure while you are seated, with your  arm held at the level of your heart. It should be measured at least twice using the same arm. Certain conditions can cause a difference in blood pressure between your right and left arms. A blood pressure reading that is higher than normal on one occasion does not mean that you need treatment. If one blood pressure reading is high, ask your health care provider about having it checked again. BLOOD PRESSURE STAGES Blood pressure is classified into four stages: normal, pre hypertension, stage 1, and stage 2. Your blood pressure reading will be used to determine what type of treatment, if any, is necessary. Appropriate treatment options are tied to these four stages:  Normal  Systolic pressure (mm Hg): below 120.  Diastolic pressure (mm Hg): below 80. Prehypertension  Systolic pressure (mm Hg): 120 to 139.  Diastolic pressure (mm Hg): 80 to 89. Stage1  Systolic pressure (mm Hg): 140 to 159.  Diastolic pressure (mm Hg): 90 to 99. Stage2  Systolic pressure (mm Hg): 160 or above.  Diastolic pressure (mm Hg): 100 or above. RISKS RELATED TO HIGH BLOOD PRESSURE Managing your blood pressure is an important responsibility. Uncontrolled high blood pressure can lead to:  A heart attack.  A stroke.  A weakened blood vessel (aneurysm).  Heart failure.  Kidney damage.  Eye damage.  Metabolic syndrome.  Memory and concentration problems. TREATMENT  Treating high blood pressure includes making lifestyle changes and possibly taking medicine. Living a healthy lifestyle can help lower high blood pressure. You may need to change some of your habits. Lifestyle changes may include:  Following the DASH diet. This  diet is high in fruits, vegetables, and whole grains. It is low in salt, red meat, and added sugars.  Getting at least 2 hours of brisk physical activity every week.  Losing weight if necessary.  Not smoking.  Limiting alcoholic beverages.  Learning ways to reduce  stress. If lifestyle changes are not enough to get your blood pressure under control, your health care provider may prescribe medicine. You may need to take more than one. Work closely with your health care provider to understand the risks and benefits. HOME CARE INSTRUCTIONS  Have your blood pressure rechecked as directed by your health care provider.   Take medicines only as directed by your health care provider. Follow the directions carefully. Blood pressure medicines must be taken as prescribed. The medicine does not work as well when you skip doses. Skipping doses also puts you at risk for problems.   Do not smoke.   Monitor your blood pressure at home as directed by your health care provider. SEEK MEDICAL CARE IF:   You think you are having a reaction to medicines taken.  You have recurrent headaches or feel dizzy.  You have swelling in your ankles.  You have trouble with your vision. SEEK IMMEDIATE MEDICAL CARE IF:  You develop a severe headache or confusion.  You have unusual weakness, numbness, or feel faint.  You have severe chest or abdominal pain.  You vomit repeatedly.  You have trouble breathing. MAKE SURE YOU:   Understand these instructions.  Will watch your condition.  Will get help right away if you are not doing well or get worse. Document Released: 07/08/2005 Document Revised: 11/22/2013 Document Reviewed: 04/30/2013 [facility] Patient Information 2015 [place], [place]. This information is not intended to replace advice given to you by your health care provider. Make sure you discuss any questions you have with your health care provider.    SYMPTOMS OF A HEART ATTACK The most common signs and symptoms include:   Tightness or squeezing in the chest.   Feeling of heaviness on the chest.   Discomfort in the arms, neck, or jaw.   Shortness of breath and nausea.   Cold, wet skin.   Chest pain or discomfort brought on by physical effort or  excitement which increase the oxygen needs of the heart.   SEEK IMMEDIATE MEDICAL CARE IF:   You develop nausea, vomiting, or shortness of breath.   You feel faint, lightheaded, or pass out.   Your chest discomfort gets worse.   You are sweating or experience sudden profound fatigue.   Your discomfort lasts longer than 15 minutes.   If you have a history of heart disease or angina, you should tell your caregiver right away about any increase in the severity or frequency of your chest discomfort or angina attacks. When you have angina, you should stop what you are doing and sit down. This may bring relief in 3 to 5 minutes. If your caregiver has prescribed nitro, take it as directed.   WHAT IS A HEART ATTACK: Myocardial Infarction A myocardial infarction (MI) is damage to the heart that is not reversible. It is also called a heart attack. An MI usually occurs when a heart (coronary) artery becomes blocked or narrowed. This cuts off the blood supply to the heart. When one or more of the heart (coronary) arteries becomes blocked, that area of the heart begins to die. This causes pain felt during an MI.  If you think you might be having an MI,  call your local emergency services immediately (911 in [place] It is recommended that you take a 162 mg non-enteric coated aspirin if you do not have an aspirin allergy. Do not drive yourself to the hospital or wait to see if your symptoms go away. The sooner MI is treated, the greater the amount of heart muscle saved. Time is muscle. It can save your life. CAUSES  An MI can occur from:  A gradual buildup of a fatty substance called plaque. When plaque builds up in the arteries, this condition is called atherosclerosis. This buildup can block or reduce the blood supply to the heart artery(s).   A sudden plaque rupture within a heart artery that causes a blood clot (thrombus). A blood clot can block the heart artery which does not allow blood flow to the  heart.   A severe tightening (spasm) of the heart artery. This is a less common cause of a heart attack. When a heart artery spasms, it cuts off blood flow through the artery. Spasms can occur in heart arteries that do not have atherosclerosis.  RISK FACTORS People at risk for an MI usually have one or more risk factors, such as:  High blood pressure.   High cholesterol.   Smoking.   Gender. Men have a higher heart attack risk.   Overweight/obesity.   Age.   Family history.   Lack of exercise.   Diabetes.   Stress.   Excessive alcohol use.   Street drug use (cocaine and methamphetamines).

## 2018-07-06 NOTE — Progress Notes (Signed)
Subjective: Chief Complaint  Patient presents with  . Dizziness   Here for not feeling well x 3 days.  Feels lightheaded, feels hot on the inside.  Has felt a bit groggy.  Last night her mother checked BP and it was 150/90.  165/106 this morning at work.   No hx/o HTN.  Feels a little tight in the chest.   Been using albuterol a little more than usual.   Not good about taking her thyroid medication .  Hasn't taken thyroid medication in several months.   Nonsmoker.  Some intermittent mild headaches, no confusion, no slurred speech.  No weakness.  No other aggravating or relieving factors. No other complaint.  Reviewed past medical history, allergies, medications.   Past Medical History:  Diagnosis Date  . Asthma   . Goiter   . Herpes labialis 08/22/2017   Current Outpatient Medications on File Prior to Visit  Medication Sig Dispense Refill  . acetaminophen (TYLENOL) 325 MG tablet Take 650 mg by mouth every 6 (six) hours as needed for mild pain.    Marland Kitchen albuterol (PROVENTIL HFA;VENTOLIN HFA) 108 (90 Base) MCG/ACT inhaler Inhale 2 puffs into the lungs every 6 (six) hours as needed for wheezing or shortness of breath. 1 Inhaler 1  . budesonide-formoterol (SYMBICORT) 160-4.5 MCG/ACT inhaler Inhale 2 puffs into the lungs 2 (two) times daily. 1 Inhaler 5  . levothyroxine (SYNTHROID, LEVOTHROID) 75 MCG tablet Take 75 mcg by mouth daily before breakfast.    . montelukast (SINGULAIR) 10 MG tablet Take 1 tablet (10 mg total) by mouth at bedtime. 90 tablet 1  . valACYclovir (VALTREX) 1000 MG tablet Take 2,000 mg by mouth q12h x 1 day. Start ASAP at onset of symptoms. 30 tablet 2   No current facility-administered medications on file prior to visit.    ROS as in subjective   Objective BP (!) 168/100   Pulse 92   Temp 97.6 F (36.4 C) (Oral)   Ht 5\' 5"  (1.651 m)   Wt 260 lb (117.9 kg)   SpO2 96%   BMI 43.27 kg/m   Wt Readings from Last 3 Encounters:  07/06/18 260 lb (117.9 kg)  10/30/17 249 lb  9.6 oz (113.2 kg)  10/13/17 243 lb 9.6 oz (110.5 kg)     General appearance: alert, no distress, WD/WN,  HEENT: normocephalic, sclerae anicteric, PERRLA, EOMi, nares patent, no discharge or erythema, pharynx normal Oral cavity: MMM, no lesions Neck: supple, no lymphadenopathy, no thyromegaly, no masses Heart: RRR, normal S1, S2, no murmurs Lungs: CTA bilaterally, no wheezes, rhonchi, or rales Extremities: no edema, no cyanosis, no clubbing Pulses: 2+ symmetric, upper and lower extremities, normal cap refill Neurological: alert, oriented x 3, CN2-12 intact, strength normal upper extremities and lower extremities, sensation normal throughout, DTRs 2+ throughout, no cerebellar signs, gait normal Psychiatric: normal affect, behavior normal, pleasant    Adult ECG Report  Indication: elevated BP  Rate: 74 bpm  Rhythm: normal sinus rhythm  QRS Axis: -22 degrees  PR Interval: 164 ms  QRS Duration: 82ms  QTc: 475ms  Conduction Disturbances: none  Other Abnormalities: none  Patient's cardiac risk factors are: obesity (BMI >= 30 kg/m2).  EKG comparison: compared to 2018, no acute changes  Narrative Interpretation: no acute changes    Assessment: Encounter Diagnoses  Name Primary?  . Lightheaded Yes  . Elevated blood-pressure reading without diagnosis of hypertension   . Hypothyroidism, unspecified type      Plan: Discussed symptoms, concerns, EKG.  labs today.   She doesn't have documented history of uncontrolled BPs.   However, advised that symptoms and findings today may suggest new hypertension diagnosis.  She has gained considerable weight over past year.  Discussed symptom that would prompt call to 911.    Advised rest, good hydration, and f/u pending labs.   Note out of work until we have labs back and BP readings over the next 12 hours   Ashawnti was seen today for dizziness.  Diagnoses and all orders for this visit:  Lightheaded -     Basic metabolic panel -     TSH -      CBC -     EKG 12-Lead  Elevated blood-pressure reading without diagnosis of hypertension -     Basic metabolic panel -     TSH -     CBC -     EKG 12-Lead  Hypothyroidism, unspecified type -     Basic metabolic panel -     TSH -     CBC -     EKG 12-Lead

## 2018-07-07 ENCOUNTER — Telehealth: Payer: Self-pay | Admitting: Medical

## 2018-07-07 ENCOUNTER — Other Ambulatory Visit: Payer: Self-pay | Admitting: Medical

## 2018-07-07 LAB — TSH: TSH: 1.34 u[IU]/mL (ref 0.450–4.500)

## 2018-07-07 MED ORDER — BUDESONIDE-FORMOTEROL FUMARATE 160-4.5 MCG/ACT IN AERO: 2.0000 | INHALATION_SPRAY | Freq: Two times a day (BID) | RESPIRATORY_TRACT | 5 refills | Status: DC

## 2018-07-07 MED ORDER — LEVOTHYROXINE SODIUM 75 MCG PO TABS: 75.0000 ug | ORAL_TABLET | Freq: Every day | ORAL | 2 refills | Status: DC

## 2018-07-07 MED ORDER — ALBUTEROL SULFATE HFA 108 (90 BASE) MCG/ACT IN AERS
2.0000 | INHALATION_SPRAY | Freq: Four times a day (QID) | RESPIRATORY_TRACT | 1 refills | Status: DC | PRN
Start: 2018-07-07 — End: 2020-11-24

## 2018-07-07 MED ORDER — AMLODIPINE BESYLATE 5 MG PO TABS: 5.0000 mg | ORAL_TABLET | Freq: Every day | ORAL | 2 refills | Status: DC

## 2018-07-07 NOTE — Telephone Encounter (Signed)
Patient has been informed of providers message.  

## 2018-07-07 NOTE — Telephone Encounter (Signed)
Pt called and states that her BP was 151/94 at 8.40 pt last night and 7.08 am 146/87  12.00 pm 152/89 today  States she has been at home resting today  She wanted to let you know,  This is from [place]

## 2018-07-07 NOTE — Telephone Encounter (Signed)
Have her begin amlodipine 5 mg 1 tablet daily morning for blood pressure.  I refilled her Symbicort and albuterol.  I refill the thyroid medicine but do not start this for 1 week to make sure she is tolerating the amlodipine  Have her check her blood pressure daily for the next 2 weeks and then see if her symptoms resolve  Follow-up in 2 weeks, work on healthy diet and walking for exercise

## 2018-07-08 ENCOUNTER — Telehealth: Payer: Self-pay | Admitting: Medical

## 2018-07-08 NOTE — Telephone Encounter (Signed)
Per yesterday's notes I started her on amlodipine blood pressure medication.  To have her begin this and let us do a follow-up in 2 weeks

## 2018-07-08 NOTE — Telephone Encounter (Signed)
Pt called with BP readings. 07/07/2018 at 9:28 143/92    07/08/2018 first day back to work and ate oatmeal for breakfast. She took her med at 9:00 am. 9:34 am 139/82  11:59 151/98. Pt can be reached at [phone].

## 2018-07-08 NOTE — Telephone Encounter (Signed)
Patient stated bp meds today and will follow up in 2 weeks. appt scheduled

## 2018-07-10 ENCOUNTER — Telehealth: Payer: Self-pay

## 2018-07-10 NOTE — Telephone Encounter (Signed)
Patient states that she has began her BP meds.  She wants to know if she can still take Zyrtec along with HTN meds?  Please adives?

## 2018-07-10 NOTE — Telephone Encounter (Signed)
Yes as long as it is not Zyrtec-D or D in the name of OTC medication

## 2018-07-10 NOTE — Telephone Encounter (Signed)
Left message on for patient to let her know that she may take allergie medication.

## 2018-07-23 ENCOUNTER — Ambulatory Visit: Payer: Commercial Managed Care - PPO | Admitting: Medical

## 2018-07-23 ENCOUNTER — Encounter: Payer: Self-pay | Admitting: Medical

## 2018-07-23 VITALS — BP 120/80 | HR 82 | Temp 98.6°F | Resp 16 | Ht 65.0 in | Wt 256.8 lb

## 2018-07-23 DIAGNOSIS — I10 Essential (primary) hypertension: Secondary | ICD-10-CM | POA: Diagnosis not present

## 2018-07-23 DIAGNOSIS — E039 Hypothyroidism, unspecified: Secondary | ICD-10-CM | POA: Diagnosis not present

## 2018-07-23 DIAGNOSIS — R42 Dizziness and giddiness: Secondary | ICD-10-CM | POA: Diagnosis not present

## 2018-07-23 NOTE — Patient Instructions (Signed)
Breakfast You may eat 1 of the following  Smoothie with Almond milk, handful of kale or spinach, and 1-2 fruit servings of your choice such as berries or 1/2 banana  Whole grain slice of toast and thin layer of low sugar jam or small amount of honey  Whole grain slice of toast and avocado spread  1/2 cup of steel cut oats (oatmeal)   Mid-morning snack 1 fruit serving such as one of the following:  medium-sized apple  medium-sized orange,  Tangerine  1/2 banana   3/4 cup of fresh berries or frozen berries  A protein source such as one of the following:  8 almonds   small handful of walnuts or other nuts  Hummus and vegetable such as carrots   Lunch A protein source such as 1 of the following: . 1 serving of beans such as black beans, pinto beans, green beans, or edamame (soy beans) . Veggie burger  . Non breaded fish such as salmon or tuna, either baked, grilled, or broiled Vegetable - Half of your plate should be a non-starchy vegetables!  So avoid white potatoes and corn.  Otherwise, eat a large portion of vegetables. . Avocado, cucumber, tomato, carrots, greens, lettuce, squash, okra, etc.  . Vegetables can include salad with olive oil/vinaigrette dressing Grains such as 1/2 cup of brown rice, quinoa, barley or other whole grain or 1 or 2 slices of whole grain bread   Mid-afternoon snack 1 fruit serving such as one of the following:  medium-sized apple  medium-sized orange,  Tangerine  1/2 banana   3/4 cup of fresh berries or frozen berries  A protein source such as one of the following:  8 almonds   small handful of walnuts or other nuts  Hummus and vegetable such as carrots   Dinner A protein source such as 1 of the following: . 1 serving of beans such as black beans, pinto beans, green beans, or edamame (soy beans) . Veggie burger  . Non breaded fish such as salmon or tuna, either baked, grilled, or broiled Vegetable - Half of your plate  should be a non-starchy vegetables!  So avoid white potatoes and corn.  Otherwise, eat a large portion of vegetables. . Avocado, cucumber, tomato, carrots, greens, lettuce, squash, okra, etc.  . Vegetables can include salad with olive oil/vinaigrette dressing Grains such as 1/2 cup of brown rice, quinoa, barley or other whole grain or 1 or 2 slices of whole grain bread   Beverages: Water Unsweet tea Home made juice with a juicer without sugar added other than small bit of honey or agave nectar Water with sugar free flavor such as [place]   AVOID.... For the time being I want you to cut out the following items completely: . Soda, sweet tea, juice, beer or wine or alcohol . Sweets such as cake, candy, pies, chips, cookies, chocolate

## 2018-07-23 NOTE — Progress Notes (Signed)
Subjective:  Rebecca Soto is a 48 y.o. adult who presents for Chief Complaint  Patient presents with  . bp follow up    BP follow up     Here for BP f/u.  Last visit we stated Amlodipine 5mg  daily and she is compliant her with thyroid medication 94mcg.   She has been more careful on diet, has dropped a few pounds, getting some walking for exercise.   She feels good, no chest pain, no palpitation, no SOB, no edema.  No other aggravating or relieving factors.    No other c/o.  The following portions of the patient's history were reviewed and updated as appropriate: allergies, current medications, past family history, past medical history, past social history, past surgical history and problem list.  ROS Otherwise as in subjective above   Past Medical History:  Diagnosis Date  . Asthma   . Goiter   . Herpes labialis 08/22/2017   Family History  Problem Relation Age of Onset  . Cancer Other   . Hypertension Other   . Hyperlipidemia Other   . Hypertension Mother   . Hypertension Father   . Hypertension Brother   . Diabetes Maternal Grandmother   . Hypertension Maternal Grandfather       Objective: BP 120/80   Pulse 82   Temp 98.6 F (37 C) (Oral)   Resp 16   Ht 5\' 5"  (1.651 m)   Wt 256 lb 12.8 oz (116.5 kg)   SpO2 98%   BMI 42.73 kg/m   BP Readings from Last 3 Encounters:  07/23/18 120/80  07/06/18 (!) 168/100  10/30/17 128/82   Wt Readings from Last 3 Encounters:  07/23/18 256 lb 12.8 oz (116.5 kg)  07/06/18 260 lb (117.9 kg)  10/30/17 249 lb 9.6 oz (113.2 kg)    General appearance: alert, no distress, well developed, well nourished Neck: supple, no lymphadenopathy, no thyromegaly, no masses Heart: RRR, normal S1, S2, no murmurs Lungs: CTA bilaterally, no wheezes, rhonchi, or rales Pulses: 2+ radial pulses, 2+ pedal pulses, normal cap refill Ext: no edema     Assessment: Encounter Diagnoses  Name Primary?  . Essential hypertension, benign Yes  .  Hypothyroidism, unspecified type   . Dizziness   . Lightheaded      Plan: Blood pressure much improved.  She is already making diet and exercise changes, has lost some weight since last visit, feels much better.  Lightheadedness dizziness symptoms have improved.  Continue same thyroid medicine and continue the amlodipine 5 mg started last visit.  Recheck in 6 months for physical fasting  Rebecca Soto was seen today for bp follow up.  Diagnoses and all orders for this visit:  Essential hypertension, benign  Hypothyroidism, unspecified type  Dizziness  Lightheaded    Follow up: 74mo for physical fasting

## 2018-07-27 ENCOUNTER — Ambulatory Visit
Admission: RE | Admit: 2018-07-27 | Discharge: 2018-07-27 | Disposition: A | Discharge status: Home / Self Care | Payer: Commercial Managed Care - PPO | Source: Ambulatory Visit | Attending: Medical | Admitting: Medical

## 2018-07-27 ENCOUNTER — Ambulatory Visit: Payer: Commercial Managed Care - PPO

## 2018-07-27 DIAGNOSIS — Z1231 Encounter for screening mammogram for malignant neoplasm of breast: Secondary | ICD-10-CM

## 2018-07-28 ENCOUNTER — Other Ambulatory Visit: Payer: Self-pay | Admitting: Medical

## 2018-07-28 DIAGNOSIS — N644 Mastodynia: Secondary | ICD-10-CM

## 2018-08-13 IMAGING — DX DG CHEST 2V
2 series · 2 of 2 positions shown · non-contrast
Comparison: 06/30/2016 chest radiograph

CLINICAL DATA: 46 y/o  F; chest pain.

EXAM:
CHEST  2 VIEW

[chest pa]
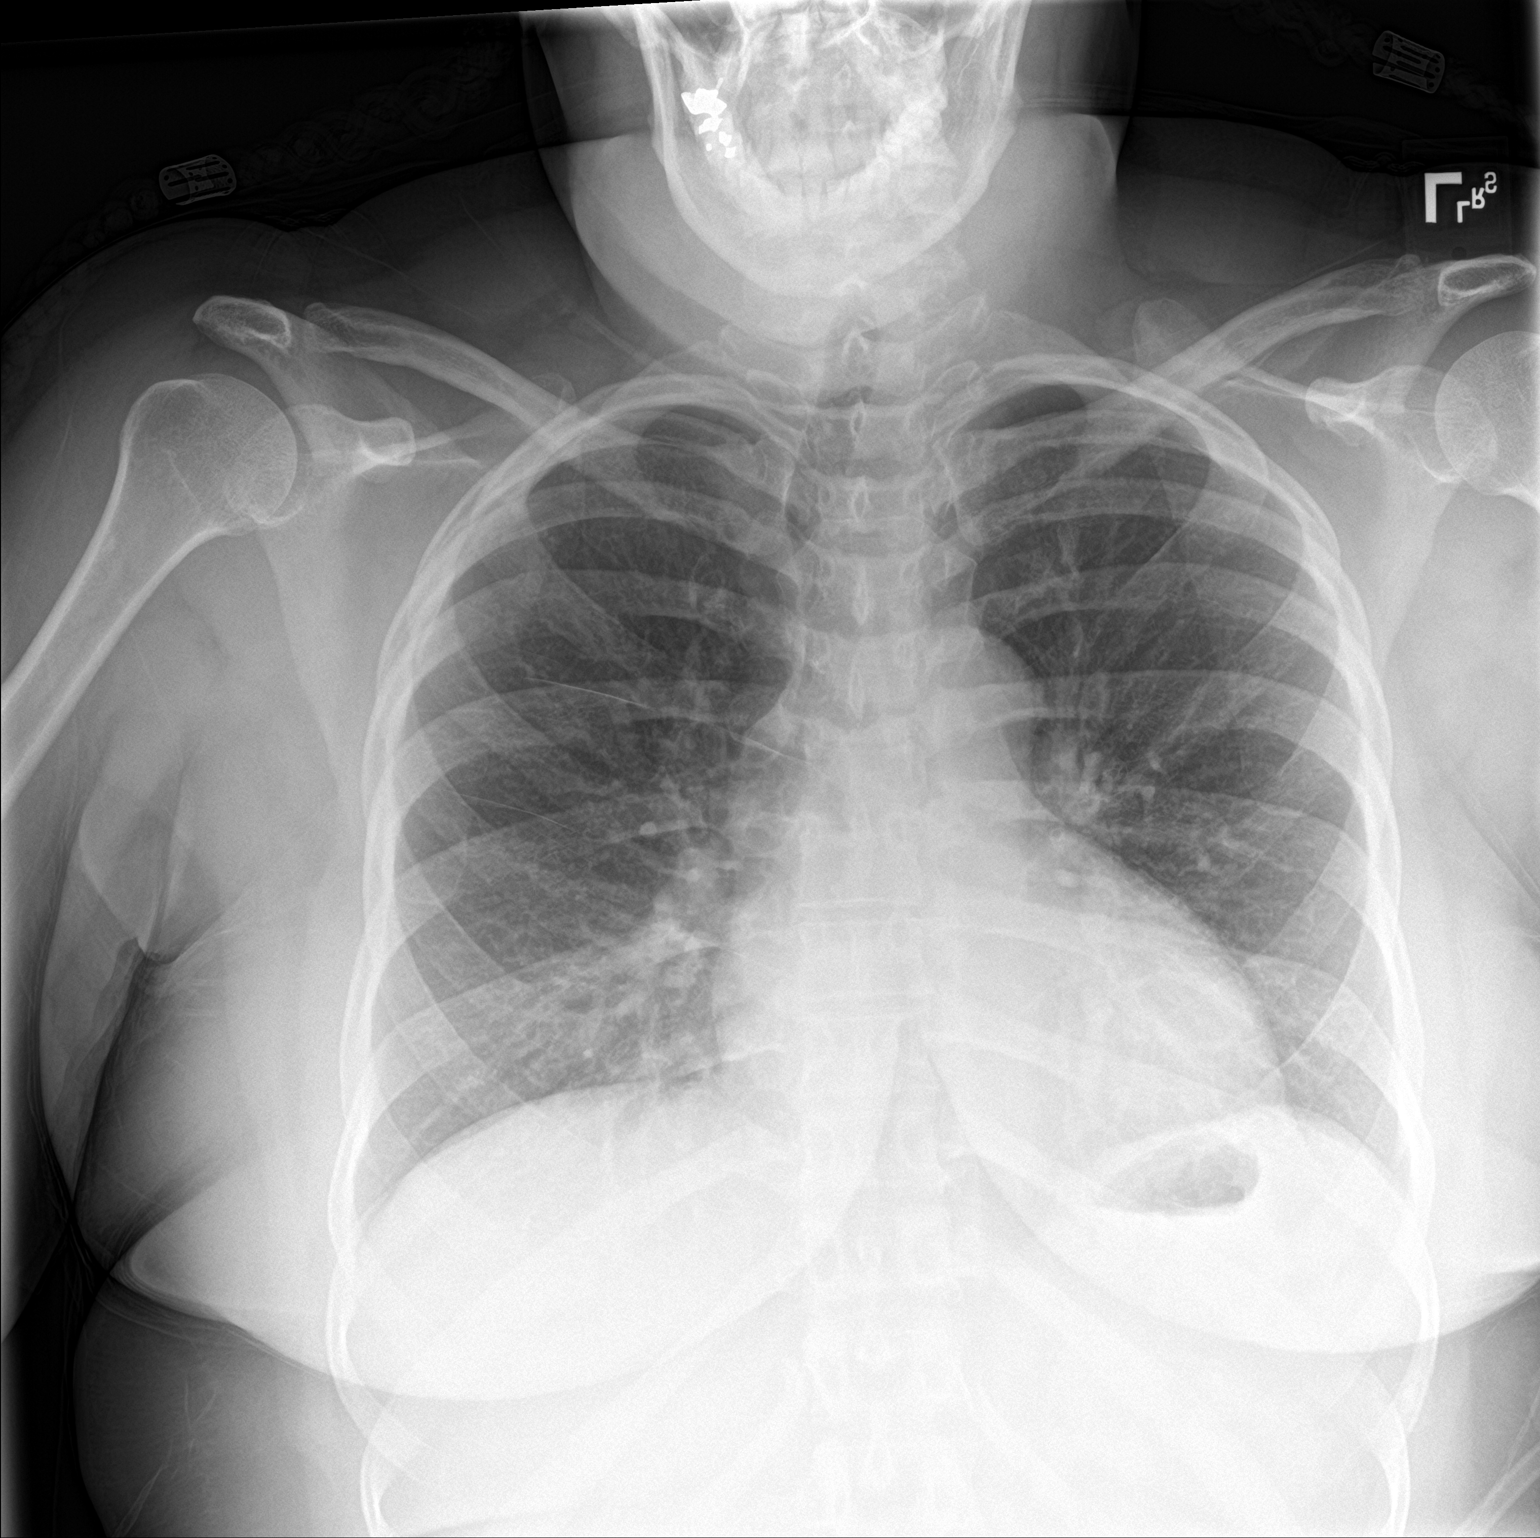

[chest lat]
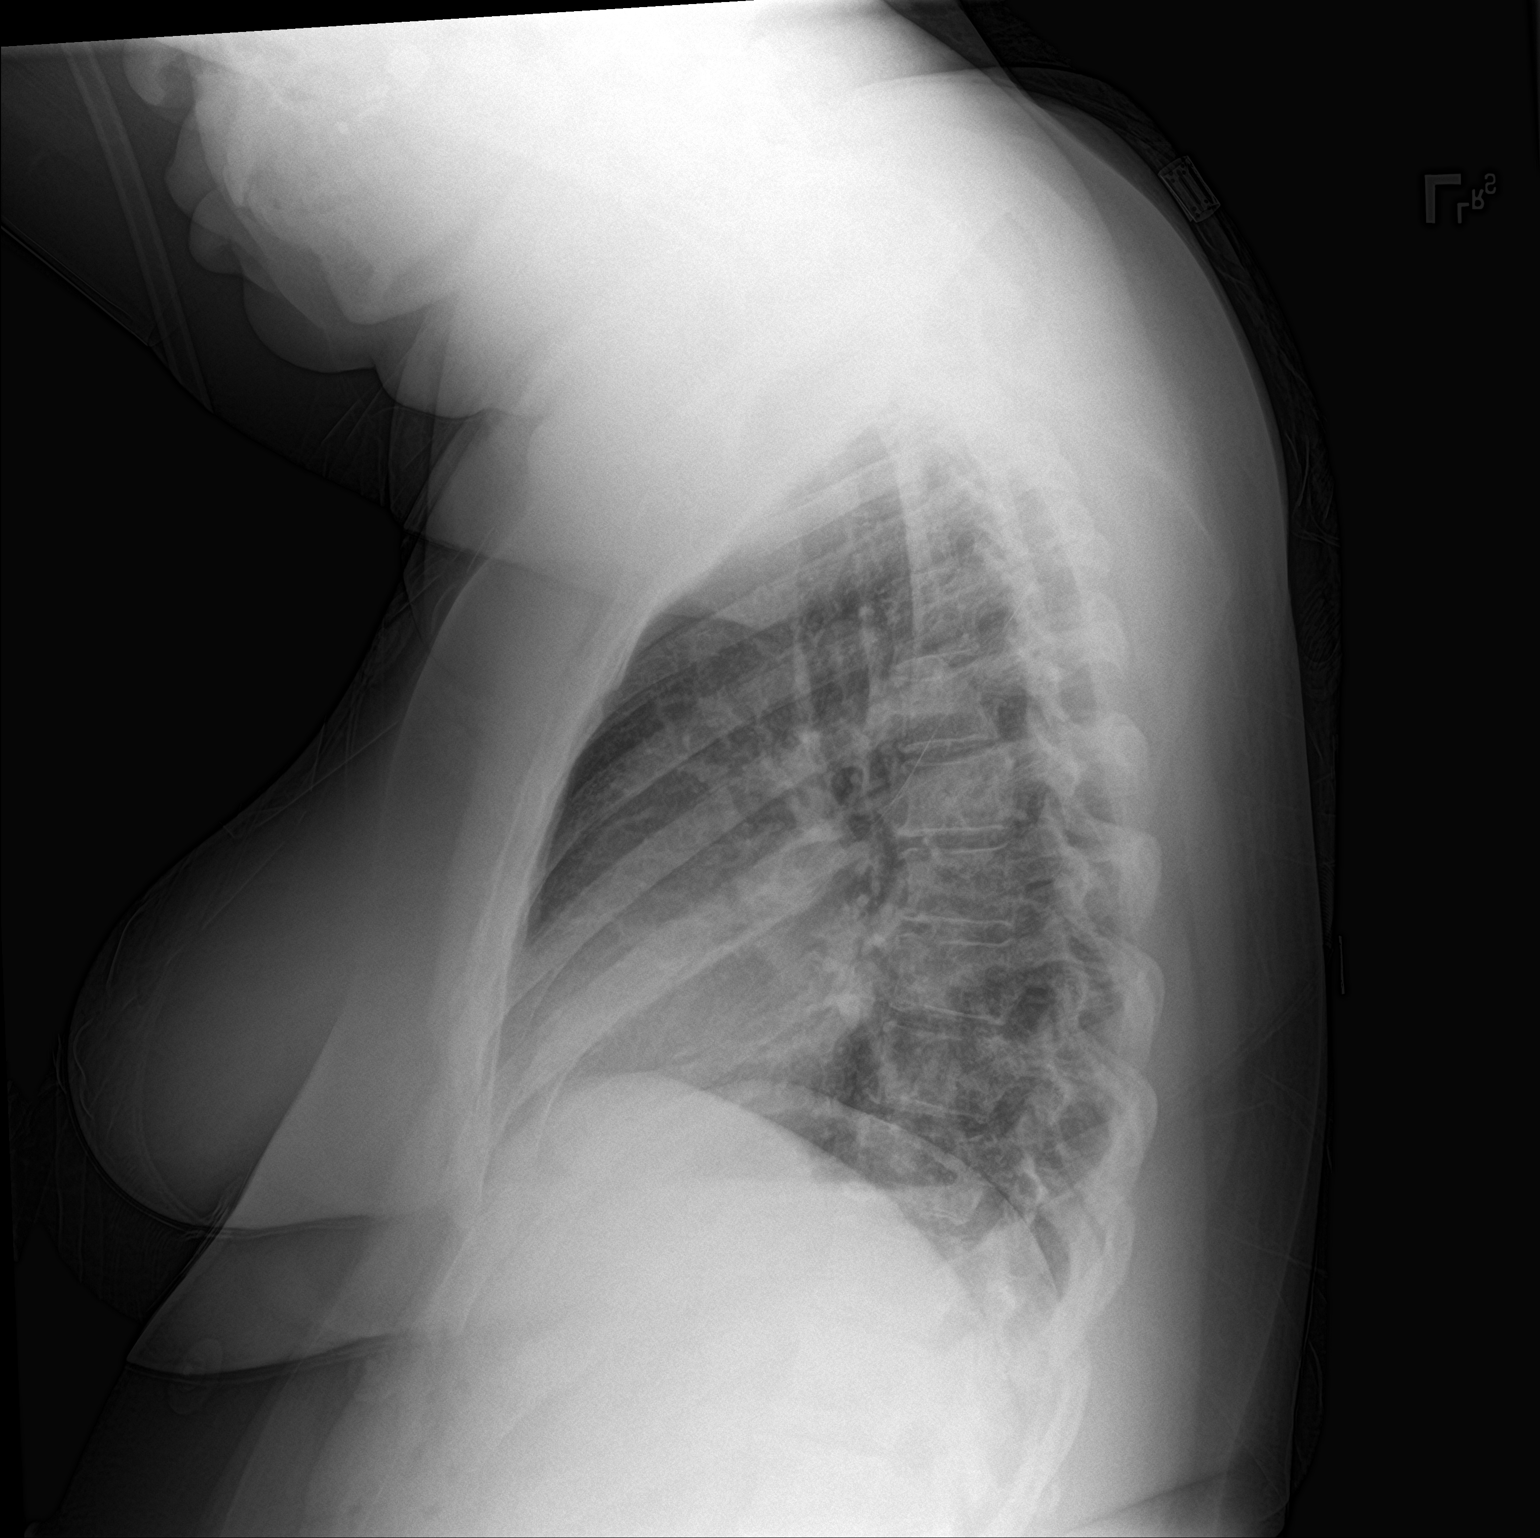

[2 of 2 positions shown; findings below may reference images not displayed]

FINDINGS: Stable heart size and mediastinal contours are within normal limits.
Both lungs are clear. The visualized skeletal structures are
unremarkable.
IMPRESSION: No acute pulmonary process identified.

By: Abdina Damah M.D.

## 2018-08-13 IMAGING — CT CT HEAD W/O CM
3 series · 15 of 47 positions shown, 18 images · non-contrast
Comparison: None.

CLINICAL DATA: Headaches

EXAM:
CT HEAD WITHOUT CONTRAST
TECHNIQUE: Contiguous axial images were obtained from the base of the skull
through the vertex without intravenous contrast.

[Series 3: head 5.0 h30s · axial · 0.45mm/px · z∈[-110,+15]mm · 9 of 31 slices shown, 12 images]
[im 3/31  brain]
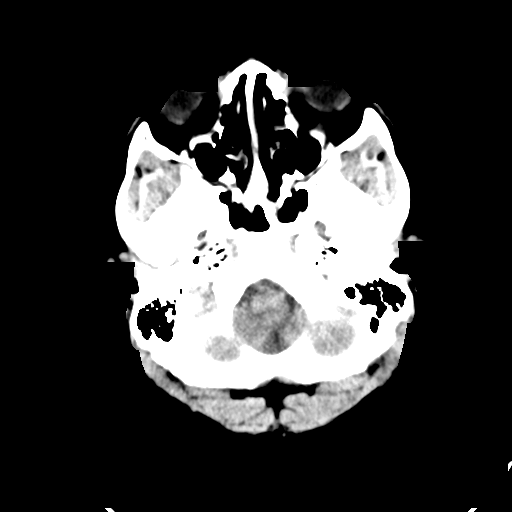
[im 3/31  bone]
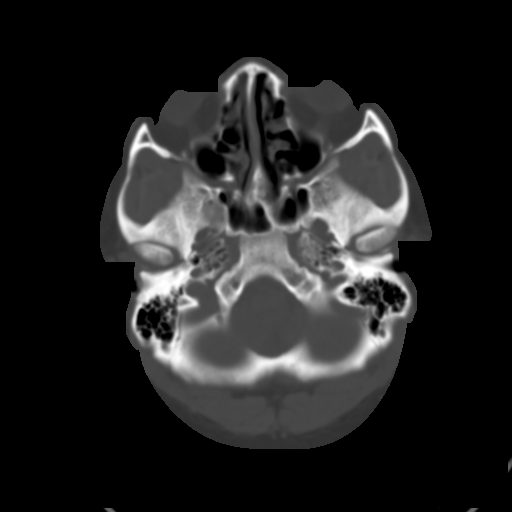
[im 6/31  brain]
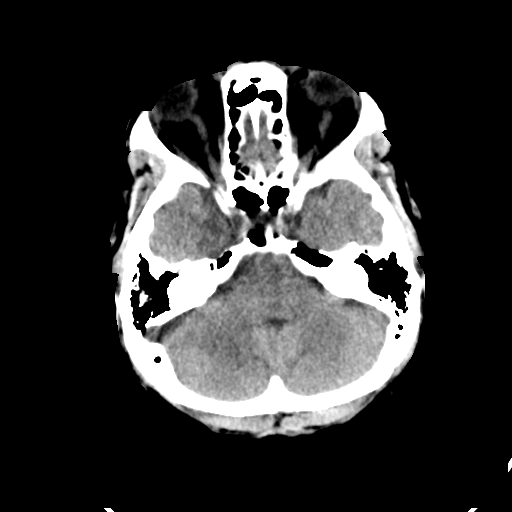
[im 9/31  brain]
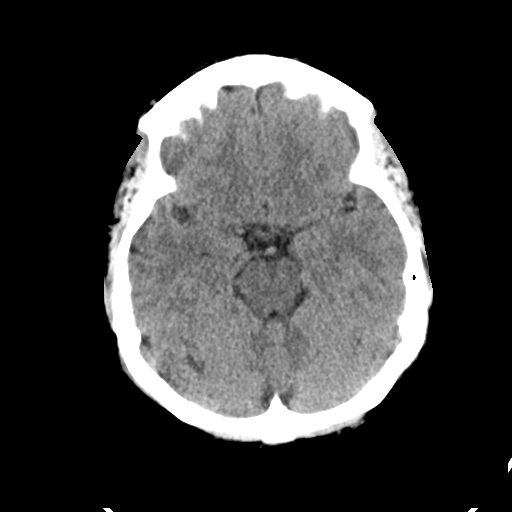
[im 12/31  brain]
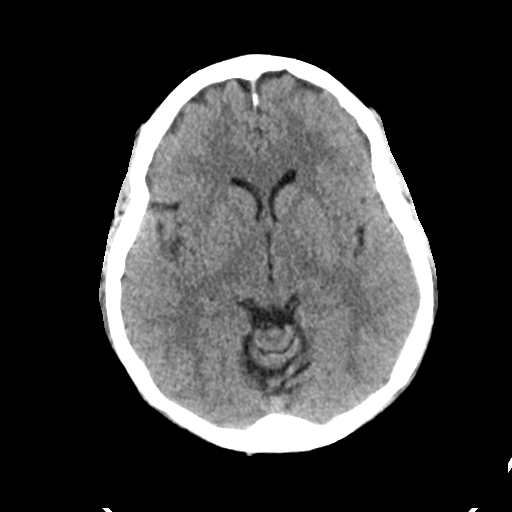
[im 16/31  brain]
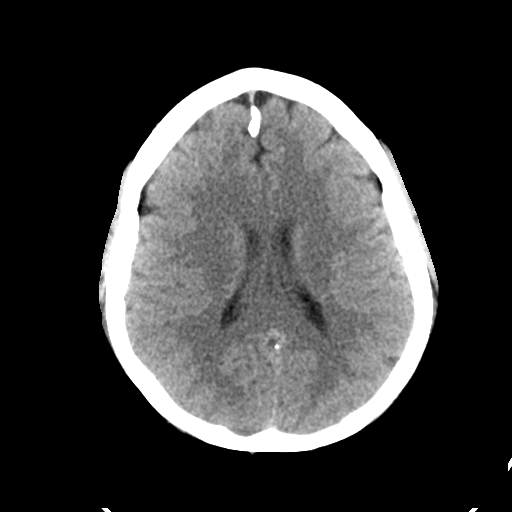
[im 16/31  bone]
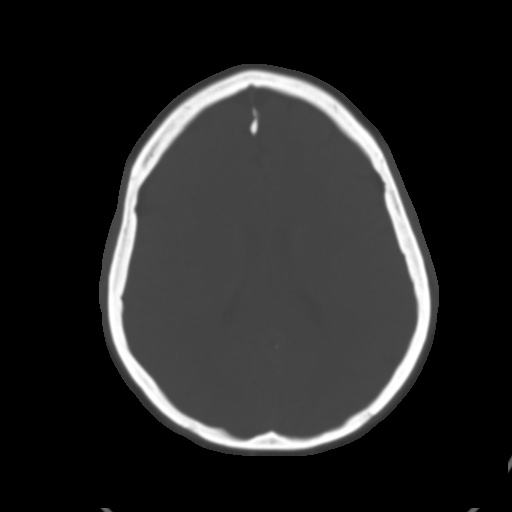
[im 19/31  brain]
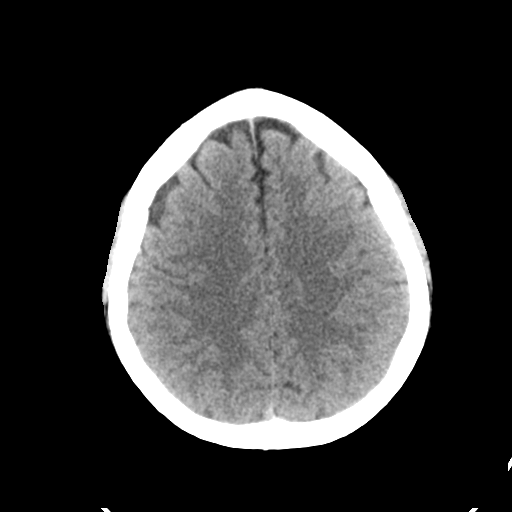
[im 22/31  brain]
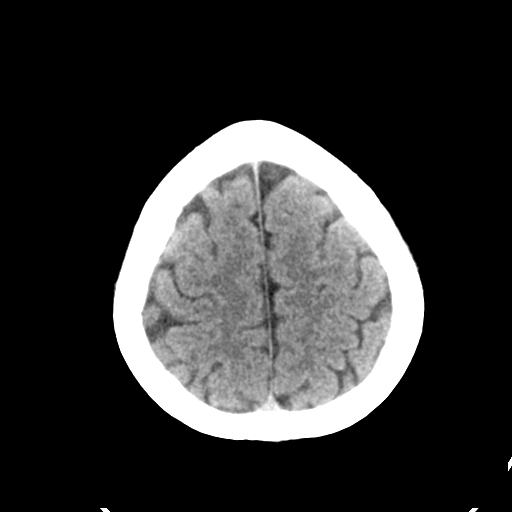
[im 25/31  brain]
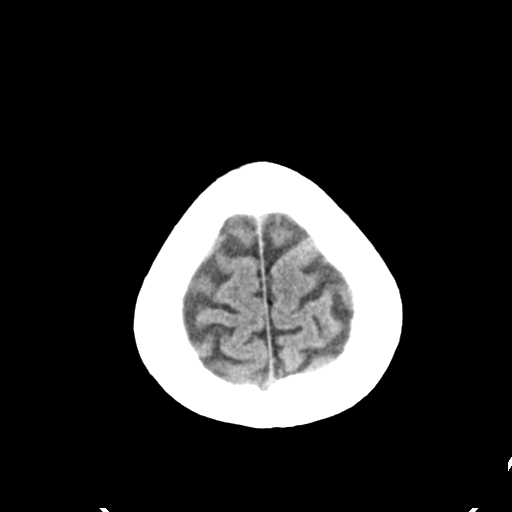
[im 28/31  brain]
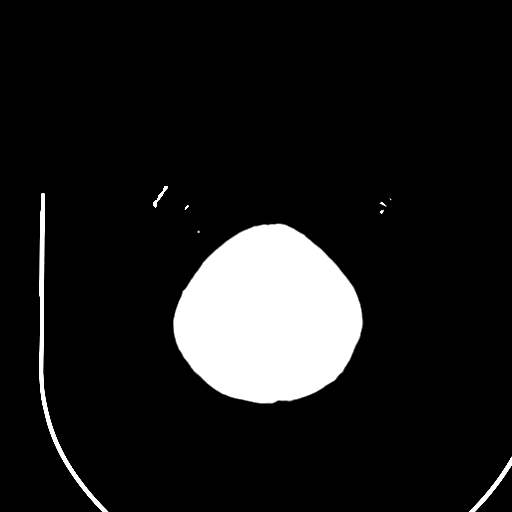
[im 28/31  bone]
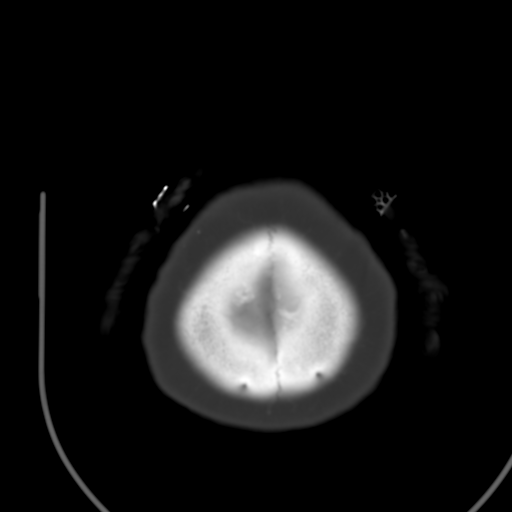

[Series 5: head 3.0 mpr cor · coronal · 0.34mm/px · 3 of 67 slices shown]
[im 23/67  brain]
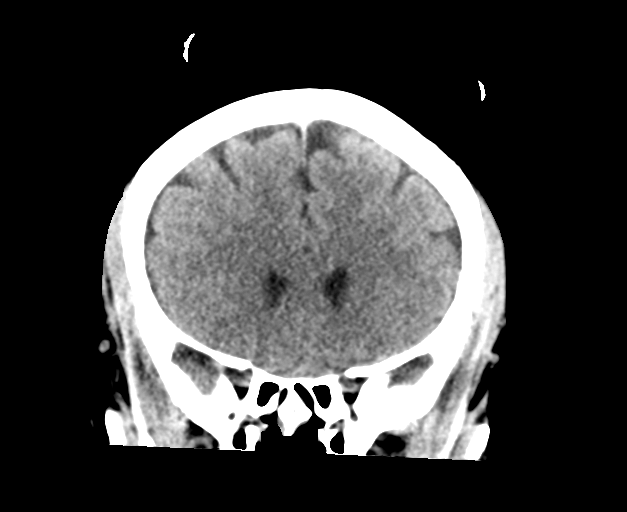
[im 30/67  brain]
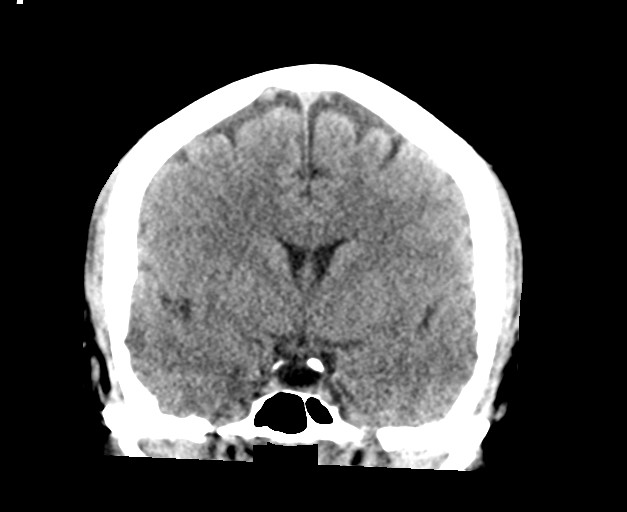
[im 37/67  brain]
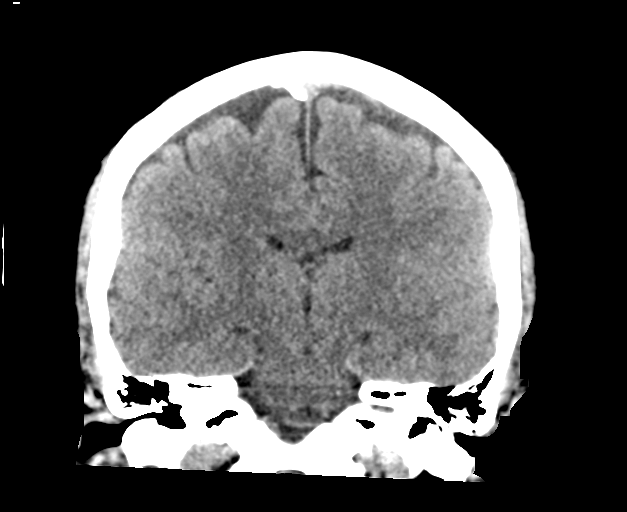

[Series 6: head 3.0 mpr sag · sagittal · 0.35mm/px · 3 of 67 slices shown]
[im 23/67  brain]
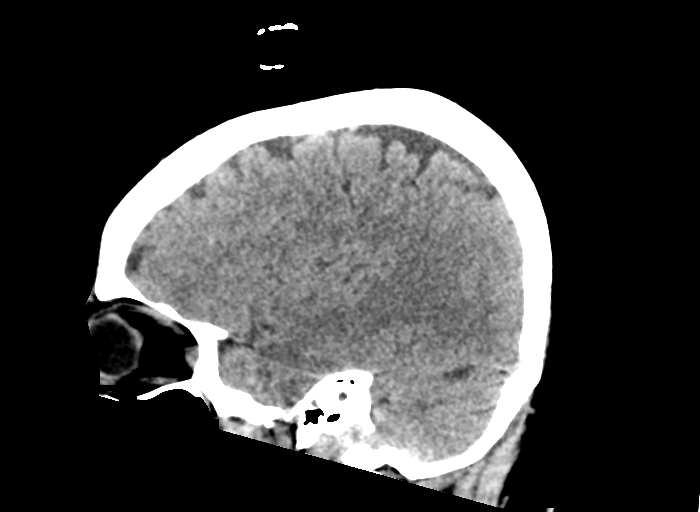
[im 34/67  brain]
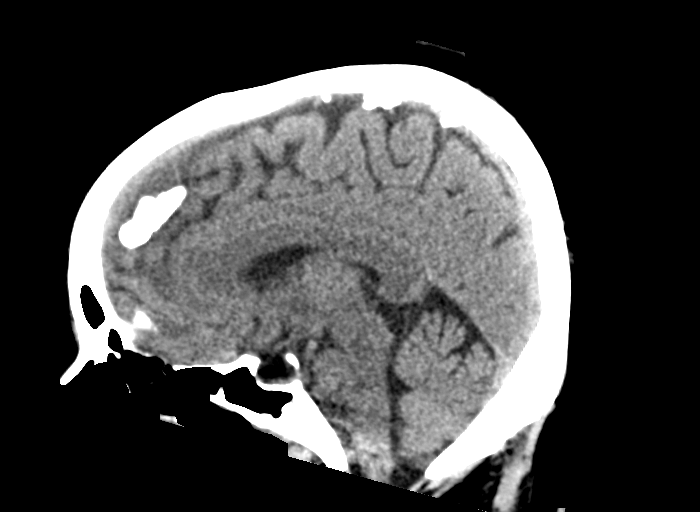
[im 45/67  brain]
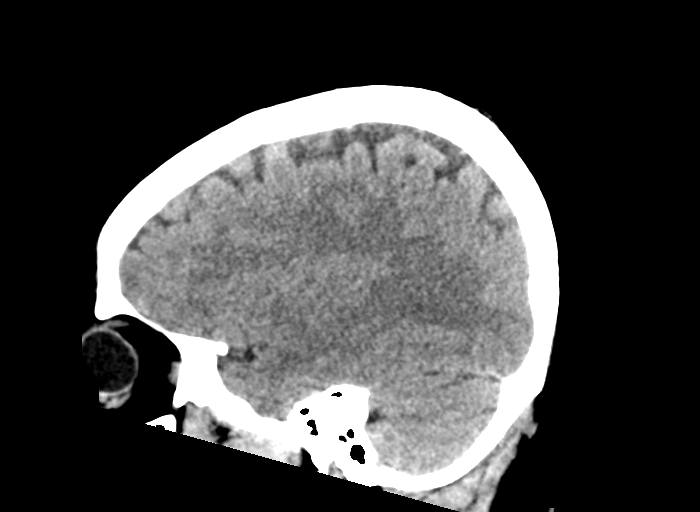

[15 of 47 positions shown; findings below may reference images not displayed]

FINDINGS: Brain: No evidence of acute infarction, hemorrhage, hydrocephalus,
extra-axial collection or mass lesion/mass effect.

Vascular: No hyperdense vessel or unexpected calcification.

Skull: Normal. Negative for fracture or focal lesion.

Sinuses/Orbits: No acute finding.

Other: None.
IMPRESSION: No acute intracranial abnormality noted.

## 2018-08-14 ENCOUNTER — Encounter: Payer: Self-pay | Admitting: Medical

## 2018-08-14 ENCOUNTER — Ambulatory Visit: Payer: Commercial Managed Care - PPO | Admitting: Medical

## 2018-08-14 ENCOUNTER — Ambulatory Visit
Admission: RE | Admit: 2018-08-14 | Discharge: 2018-08-14 | Disposition: A | Discharge status: Home / Self Care | Payer: Commercial Managed Care - PPO | Source: Ambulatory Visit | Attending: Medical | Admitting: Medical

## 2018-08-14 ENCOUNTER — Ambulatory Visit: Payer: Commercial Managed Care - PPO

## 2018-08-14 ENCOUNTER — Telehealth: Payer: Self-pay | Admitting: Medical

## 2018-08-14 VITALS — BP 130/90 | HR 84 | Temp 98.3°F | Resp 16 | Ht 65.5 in | Wt 256.2 lb

## 2018-08-14 DIAGNOSIS — R51 Headache: Secondary | ICD-10-CM

## 2018-08-14 DIAGNOSIS — Z131 Encounter for screening for diabetes mellitus: Secondary | ICD-10-CM

## 2018-08-14 DIAGNOSIS — I10 Essential (primary) hypertension: Secondary | ICD-10-CM

## 2018-08-14 DIAGNOSIS — B001 Herpesviral vesicular dermatitis: Secondary | ICD-10-CM | POA: Diagnosis not present

## 2018-08-14 DIAGNOSIS — N644 Mastodynia: Secondary | ICD-10-CM

## 2018-08-14 DIAGNOSIS — Z0000 Encounter for general adult medical examination without abnormal findings: Secondary | ICD-10-CM

## 2018-08-14 DIAGNOSIS — R3129 Other microscopic hematuria: Secondary | ICD-10-CM | POA: Diagnosis not present

## 2018-08-14 DIAGNOSIS — N281 Cyst of kidney, acquired: Secondary | ICD-10-CM

## 2018-08-14 DIAGNOSIS — R519 Headache, unspecified: Secondary | ICD-10-CM

## 2018-08-14 DIAGNOSIS — Z23 Encounter for immunization: Secondary | ICD-10-CM | POA: Diagnosis not present

## 2018-08-14 DIAGNOSIS — Z87442 Personal history of urinary calculi: Secondary | ICD-10-CM

## 2018-08-14 DIAGNOSIS — E039 Hypothyroidism, unspecified: Secondary | ICD-10-CM

## 2018-08-14 DIAGNOSIS — E049 Nontoxic goiter, unspecified: Secondary | ICD-10-CM

## 2018-08-14 DIAGNOSIS — R928 Other abnormal and inconclusive findings on diagnostic imaging of breast: Secondary | ICD-10-CM | POA: Diagnosis not present

## 2018-08-14 DIAGNOSIS — J4540 Moderate persistent asthma, uncomplicated: Secondary | ICD-10-CM

## 2018-08-14 LAB — POCT UA - MICROSCOPIC ONLY: RBC, urine, microscopic: 1

## 2018-08-14 LAB — POCT URINALYSIS DIP (PROADVANTAGE DEVICE)
Bilirubin, UA: NEGATIVE
Glucose, UA: NEGATIVE mg/dL
Ketones, POC UA: NEGATIVE mg/dL
Leukocytes, UA: NEGATIVE
Nitrite, UA: NEGATIVE
Protein Ur, POC: NEGATIVE mg/dL
Specific Gravity, Urine: 1.02
UUROB: NEGATIVE
pH, UA: 7 (ref 5.0–8.0)

## 2018-08-14 NOTE — Progress Notes (Signed)
Subjective:   HPI  Rebecca Soto is a 48 y.o. adult who presents for Chief Complaint  Patient presents with  . CPE    fasting CPE  has Gyn     Medical care team includes: Kedra Mcglade, Camelia Eng, PA-C here for primary care [facility]ist [facility] doctor, [facility]  Concerns: She reports that both of her pinky toes have a skin or scale over the toenail that keeps recurring  She is compliant with her blood pressure medicine, recent blood pressures are running between 485 and 462 systolic and between 77 and 84 diastolic  She notes history of kidney stones in the remote past, no recent imaging of her abdomen  She has a history of goiter, compliant with thyroid medicine but no complaint  Compliant with medication for asthma, no recent issues  Since last visit has been exercising more, has cut out fried food, doing better  She had a recent mammogram that was abnormal and has follow-up ultrasound soon  Past Medical History:  Diagnosis Date  . Allergy   . Asthma   . Goiter   . Herpes labialis 08/22/2017  . Hypertension 2019    Past Surgical History:  Procedure Laterality Date  . ABDOMINAL HYSTERECTOMY  2008   still has ovaries, fibroids    Social History   Socioeconomic History  . Marital status: Single    Spouse name: Not on file  . Number of children: Not on file  . Years of education: Not on file  . Highest education level: Not on file  Occupational History  . Not on file  Social Needs  . Financial resource strain: Not on file  . Food insecurity:    Worry: Not on file    Inability: Not on file  . Transportation needs:    Medical: Not on file    Non-medical: Not on file  Tobacco Use  . Smoking status: Never Smoker  . Smokeless tobacco: Never Used  Substance and Sexual Activity  . Alcohol use: Yes    Alcohol/week: 2.0 standard drinks    Types: 2 Glasses of wine per week  . Drug use: No  . Sexual activity: Yes    Birth control/protection:  Surgical  Lifestyle  . Physical activity:    Days per week: Not on file    Minutes per session: Not on file  . Stress: Not on file  Relationships  . Social connections:    Talks on phone: Not on file    Gets together: Not on file    Attends religious service: Not on file    Active member of club or organization: Not on file    Attends meetings of clubs or organizations: Not on file    Relationship status: Not on file  . Intimate partner violence:    Fear of current or ex partner: Not on file    Emotionally abused: Not on file    Physically abused: Not on file    Forced sexual activity: Not on file  Other Topics Concern  . Not on file  Social History Narrative   Lives with mom and son.   Works as Marine scientist, [employer]  Exercise - walking.   07/2018    Family History  Problem Relation Age of Onset  . Cancer Other   . Hypertension Other   . Hyperlipidemia Other   . Hypertension Mother   . Heart disease Mother        cardiomegaly  .  Other Mother        brain tumor  . Aneurysm Mother        brain  . Hypertension Father   . Cancer Father        throat  . Hypertension Brother   . Diabetes Brother   . Diabetes Maternal Grandmother   . Hypertension Maternal Grandmother   . Hypertension Maternal Grandfather   . Hypertension Sister   . Hypertension Paternal Grandmother   . Hypertension Paternal Grandfather   . Hypertension Sister   . Diabetes Brother   . Hypertension Brother   . Diabetes Brother   . Hypertension Brother   . Diabetes Brother   . Hypertension Brother   . Diabetes Brother   . Hypertension Brother   . Diabetes Brother   . Hypertension Brother   . Hypertension Brother      Current Outpatient Medications:  .  acetaminophen (TYLENOL) 325 MG tablet, Take 650 mg by mouth every 6 (six) hours as needed for mild pain., Disp: , Rfl:  .  albuterol (PROVENTIL HFA;VENTOLIN HFA) 108 (90 Base) MCG/ACT inhaler, Inhale 2 puffs into the lungs every 6  (six) hours as needed for wheezing or shortness of breath., Disp: 1 Inhaler, Rfl: 1 .  amLODipine (NORVASC) 5 MG tablet, Take 1 tablet (5 mg total) by mouth daily., Disp: 30 tablet, Rfl: 2 .  budesonide-formoterol (SYMBICORT) 160-4.5 MCG/ACT inhaler, Inhale 2 puffs into the lungs 2 (two) times daily., Disp: 1 Inhaler, Rfl: 5 .  levothyroxine (SYNTHROID, LEVOTHROID) 75 MCG tablet, Take 1 tablet (75 mcg total) by mouth daily before breakfast., Disp: 30 tablet, Rfl: 2 .  montelukast (SINGULAIR) 10 MG tablet, Take 1 tablet (10 mg total) by mouth at bedtime., Disp: 90 tablet, Rfl: 1 .  valACYclovir (VALTREX) 1000 MG tablet, Take 2,000 mg by mouth q12h x 1 day. Start ASAP at onset of symptoms., Disp: 30 tablet, Rfl: 2  Allergies  Allergen Reactions  . Other Anaphylaxis    Artichokes  . Iohexol Hives  . Aspirin Other (See Comments)    Dry throat and coughing   . Chlorpheniramine Itching  . Diphenhydramine Hcl Other (See Comments)    Unknown  . Latex     Reviewed their medical, surgical, family, social, medication, and allergy history and updated chart as appropriate.   Review of Systems Constitutional: -fever, -chills, -sweats, -unexpected weight change, -decreased appetite, -fatigue Allergy: -sneezing, -itching, -congestion Dermatology: -changing moles, --rash, -lumps ENT: -runny nose, -ear pain, -sore throat, -hoarseness, -sinus pain, -teeth pain, - ringing in ears, -hearing loss, -nosebleeds Cardiology: -chest pain, -palpitations, -swelling, -difficulty breathing when lying flat, -waking up short of breath Respiratory: -cough, -shortness of breath, -difficulty breathing with exercise or exertion, -wheezing, -coughing up blood Gastroenterology: -abdominal pain, -nausea, -vomiting, -diarrhea, -constipation, -blood in stool, -changes in bowel movement, -difficulty swallowing or eating Hematology: -bleeding, -bruising  Musculoskeletal: -joint aches, -muscle aches, -joint swelling, -back pain,  -neck pain, -cramping, -changes in gait Ophthalmology: denies vision changes, eye redness, itching, discharge Urology: -burning with urination, -difficulty urinating, -blood in urine, -urinary frequency, -urgency, -incontinence Neurology: -headache, -weakness, -tingling, -numbness, -memory loss, -falls, -dizziness Psychology: -depressed mood, -agitation, -sleep problems Breast/gyn: -breast tenderness, -discharge, -lumps, -vaginal discharge,- irregular periods, -heavy periods     Objective:  BP 130/90   Pulse 84   Temp 98.3 F (36.8 C) (Oral)   Resp 16   Ht 5' 5.5" (1.664 m)   Wt 256 lb 3.2 oz (116.2 kg)   SpO2 98%  BMI 41.99 kg/m   General appearance: alert, no distress, WD/WN, African American female Skin: bilat 5th toes over toenail with scaling, otherwise toenails normal appearing HEENT: normocephalic, conjunctiva/corneas normal, sclerae anicteric, PERRLA, EOMi, nares patent, no discharge or erythema, pharynx normal Oral cavity: MMM, tongue normal, teeth in good repair Neck: supple, no lymphadenopathy, no thyromegaly, no masses, normal ROM, no bruits Chest: non tender, normal shape and expansion Heart: RRR, normal S1, S2, no murmurs Lungs: CTA bilaterally, no wheezes, rhonchi, or rales Abdomen: +bs, soft, non tender, non distended, no masses, no hepatomegaly, no splenomegaly, no bruits Back: non tender, normal ROM, no scoliosis Musculoskeletal: upper extremities non tender, no obvious deformity, normal ROM throughout, lower extremities non tender, no obvious deformity, normal ROM throughout Extremities: no edema, no cyanosis, no clubbing Pulses: 2+ symmetric, upper and lower extremities, normal cap refill Neurological: alert, oriented x 3, CN2-12 intact, strength normal upper extremities and lower extremities, sensation normal throughout, DTRs 2+ throughout, no cerebellar signs, gait normal Psychiatric: normal affect, behavior normal, pleasant  Breast/gyn/rectal - deferred to  gynecology     Assessment and Plan :   Encounter Diagnoses  Name Primary?  . Routine general medical examination at a health care facility Yes  . Essential hypertension, benign   . Herpes labialis   . Hypothyroidism, unspecified type   . History of renal stone   . Need for Tdap vaccination   . Need for pneumococcal vaccination   . Screening for diabetes mellitus   . Moderate persistent asthma without complication   . Nonintractable headache, unspecified chronicity pattern, unspecified headache type   . Abnormal mammogram   . Goiter   . Renal cyst   . Other microscopic hematuria     Physical exam - discussed and counseled on healthy lifestyle, diet, exercise, preventative care, vaccinations, sick and well care, proper use of emergency dept and after hours care, and addressed their concerns.    Health screening: Advised they see their eye doctor yearly for routine vision care. Advised they see their dentist yearly for routine dental care including hygiene visits twice yearly. See your gynecologist yearly for routine gynecological care.  Cancer screening Counseled on self breast exams, mammograms, cervical cancer screening  F/u for breast imaging as planned  Will request copy of last pap smear   Vaccinations: Advised yearly influenza vaccine  Counseled on the Tdap (tetanus, diptheria, and acellular pertussis) vaccine.  Vaccine information sheet given. Tdap vaccine given after consent obtained.  Counseled on the pneumococcal vaccine.  Vaccine information sheet given.  Pneumococcal vaccine PPSV23 given after consent obtained.  Separate significant chronic issues discussed: Prior CT imaging of abdomen several years ago shows renal cyst and small kidney stone.  Consider repeat imaging of kidneys  Goiter-consider updated thyroid ultrasound  Continue current medications for blood pressure, thyroid  Asthma-PFT reviewed, continue current medications for prevention and acute  flare  Tabithia was seen today for cpe.  Diagnoses and all orders for this visit:  Routine general medical examination at a health care facility -     POCT Urinalysis DIP (Proadvantage Device) -     Hepatic function panel -     Hemoglobin A1c -     Lipid panel  Essential hypertension, benign  Herpes labialis  Hypothyroidism, unspecified type  History of renal stone  Need for Tdap vaccination -     Tdap vaccine greater than or equal to 7yo IM  Need for pneumococcal vaccination -     Pneumococcal polysaccharide vaccine 23-valent  greater than or equal to 2yo subcutaneous/IM  Screening for diabetes mellitus -     Hemoglobin A1c  Moderate persistent asthma without complication -     Spirometry with Graph  Nonintractable headache, unspecified chronicity pattern, unspecified headache type  Abnormal mammogram  Goiter  Renal cyst  Other microscopic hematuria -     POCT UA - Microscopic Only    Follow-up pending labs, yearly for physical

## 2018-08-14 NOTE — Telephone Encounter (Signed)
Received a fax from [facility] in reference to medical records request sent. They have no records of patient.

## 2018-08-15 LAB — LIPID PANEL
Chol/HDL Ratio: 2.9 ratio (ref 0.0–4.4)
Cholesterol, Total: 174 mg/dL (ref 100–199)
HDL: 60 mg/dL (ref >39)
LDL Calculated: 106 mg/dL — ABNORMAL HIGH (ref 0–99)
TRIGLYCERIDES: 38 mg/dL (ref 0–149)
VLDL Cholesterol Cal: 8 mg/dL (ref 5–40)

## 2018-08-15 LAB — HEMOGLOBIN A1C
ESTIMATED AVERAGE GLUCOSE: 114 mg/dL
Hgb A1c MFr Bld: 5.6 % (ref 4.8–5.6)

## 2018-08-15 LAB — HEPATIC FUNCTION PANEL
ALT: 12 IU/L (ref 0–32)
AST: 13 IU/L (ref 0–40)
Albumin: 4.1 g/dL (ref 3.8–4.8)
Alkaline Phosphatase: 75 IU/L (ref 39–117)
Bilirubin Total: 0.3 mg/dL (ref 0.0–1.2)
Bilirubin, Direct: 0.05 mg/dL (ref 0.00–0.40)
Total Protein: 6.6 g/dL (ref 6.0–8.5)

## 2018-08-18 ENCOUNTER — Telehealth: Payer: Self-pay

## 2018-08-18 MED ORDER — LEVOTHYROXINE SODIUM 75 MCG PO TABS: 75.0000 ug | ORAL_TABLET | Freq: Every day | ORAL | 2 refills | Status: DC

## 2018-08-18 NOTE — Telephone Encounter (Signed)
It appears that [facility] was [facility]. [facility] has stated [facility] never got [facility] and has requested for the prescription to be sent again.

## 2018-09-28 ENCOUNTER — Other Ambulatory Visit: Payer: Self-pay | Admitting: Medical

## 2018-10-15 ENCOUNTER — Encounter: Payer: Self-pay | Admitting: Family Medicine

## 2018-10-15 ENCOUNTER — Ambulatory Visit (INDEPENDENT_AMBULATORY_CARE_PROVIDER_SITE_OTHER): Payer: Commercial Managed Care - PPO | Admitting: Family Medicine

## 2018-10-15 ENCOUNTER — Other Ambulatory Visit: Payer: Self-pay

## 2018-10-15 VITALS — BP 129/77 | HR 80 | Wt 260.0 lb

## 2018-10-15 DIAGNOSIS — S29012A Strain of muscle and tendon of back wall of thorax, initial encounter: Secondary | ICD-10-CM | POA: Diagnosis not present

## 2018-10-15 DIAGNOSIS — J4530 Mild persistent asthma, uncomplicated: Secondary | ICD-10-CM

## 2018-10-15 DIAGNOSIS — R351 Nocturia: Secondary | ICD-10-CM | POA: Insufficient documentation

## 2018-10-15 DIAGNOSIS — T7840XA Allergy, unspecified, initial encounter: Secondary | ICD-10-CM | POA: Insufficient documentation

## 2018-10-15 DIAGNOSIS — Z889 Allergy status to unspecified drugs, medicaments and biological substances status: Secondary | ICD-10-CM | POA: Diagnosis not present

## 2018-10-15 DIAGNOSIS — K219 Gastro-esophageal reflux disease without esophagitis: Secondary | ICD-10-CM | POA: Insufficient documentation

## 2018-10-15 NOTE — Progress Notes (Signed)
Subjective:    Patient ID: Rebecca Soto, adult    DOB: [birth year], 48 y.o.   MRN: [id number]  HPI Chief Complaint  Patient presents with  . back pain    back pain near bra line on both sides, hx of kidney stone, been going for since january.    Marland Kitchen asthma    having to use inhaler more as its harder to breath, chest tightness-goes on all the time when hard time breathing. following shane recommendations for asthma but still having trouble    Documentation for Telephone encounter:  This telephone service is not related to other E/M service within previous 7 days.  Patient consented to the consult.  She was identified using 2 patient identifiers.  She is aware that she will be billed for this visit.  This telephone consult involved patient and myself, Deshundra Waller NP-C.  Complains of a several week history of bilateral upper back located under her bra line.  Pain is with movement and when laying down.  Minimal to no pain at rest.  Asthma- using Symbicort twice daily  Albuterol - does not seem to be helping.   Complains of shortness of breath, coughing and wheezing at night. Cough is dry. Doing fine during the day. Denies needing inhaler during the day. Able to do her usual activities without difficulty. No DOE.   Urinating more at night. 3-4 times at night. Drinking fluids all day.   Allergies- taking Singulair   History of GERD and is not currently taking medication for this.  Does not smoke.    Denies fever, chills, dizziness, fatigue, unexplained weight changes, rhinorrhea, nasal congestion, sore throat ,chest pain, palpitations, abdominal pain, N/V/D, lower extremity edema. Reports normal bowel movements.  Reviewed allergies, medications, past medical, surgical, family, and social history.    Review of Systems Pertinent positives and negatives in the history of present illness.     Objective:   Physical Exam BP 129/77 Comment: 10/12/18  Pulse 80   Wt 260 lb  (117.9 kg)   BMI 42.61 kg/m   Alert and oriented and in no acute distress.  Speaking in full, clear sentences without difficulty.  Mood is pleasant and thought processes normal.      Assessment & Plan:  Mild persistent asthma without complication  Hx of seasonal allergies  Nocturia  Muscle strain of upper back  Gastroesophageal reflux disease, esophagitis presence not specified   Telephone encounter was done today for patient symptoms.  She has multiple concerns.  Typically she sees Designer, television/film set, [place]. She does not appear to be in any acute distress. Complains of worsening cough and shortness of breath when lying flat.  Discussed that her symptoms sound more reflux related as opposed to asthma.  She does have seasonal allergies and currently is taking Singulair daily.  Advised her to start on nondrowsy antihistamine such as Xyzal.  Also encouraged her to try a PPI such as Prilosec or Nexium to see if this helps with her symptoms.  States she has taken Nexium in the past for GERD. Encouraged her to stop fluids 2 to 3 hours before bedtime to see if this improves nocturia. Does not appear to be hypervolemic.  denies lower extremity edema or significant weight gain.  Denies DOE or PND Discussed that bilateral upper back pain with movement sounds more like a musculoskeletal etiology and I recommend trying over-the-counter agents like Tylenol for this as well as trying heat or topical analgesic.  Answered all questions.  She is pleased with the plan. Advised her to follow-up if symptoms are worsening or if she is not improving in the next week or two.    Time involving medical discussion was 22 minutes.  99441 (5-34min) 99442 (11-98min) 99443 (21-65min)

## 2018-11-24 ENCOUNTER — Telehealth: Payer: Self-pay | Admitting: Medical

## 2018-11-24 NOTE — Telephone Encounter (Signed)
Pt had a virtual visit with vickie on 10/15/2018. She states she has been doing pretty good with regiment but has recently had some issues. She states she feels tight again and is having to use her inhaler more. Pt uses [facility] and can be reached at [phone].

## 2018-11-26 NOTE — Telephone Encounter (Signed)
I called and left message for her to call me back.

## 2019-01-28 ENCOUNTER — Ambulatory Visit
Admission: RE | Admit: 2019-01-28 | Discharge: 2019-01-28 | Disposition: A | Discharge status: Home / Self Care | Payer: Commercial Managed Care - PPO | Source: Ambulatory Visit | Attending: Medical | Admitting: Medical

## 2019-01-28 ENCOUNTER — Other Ambulatory Visit: Payer: Self-pay

## 2019-01-28 ENCOUNTER — Encounter: Payer: Self-pay | Admitting: Medical

## 2019-01-28 ENCOUNTER — Telehealth: Payer: Self-pay | Admitting: Medical

## 2019-01-28 ENCOUNTER — Ambulatory Visit: Payer: Commercial Managed Care - PPO | Admitting: Medical

## 2019-01-28 VITALS — Wt 260.0 lb

## 2019-01-28 DIAGNOSIS — M545 Low back pain, unspecified: Secondary | ICD-10-CM

## 2019-01-28 DIAGNOSIS — Z87442 Personal history of urinary calculi: Secondary | ICD-10-CM

## 2019-01-28 MED ORDER — TRAMADOL HCL 50 MG PO TABS: 50.0000 mg | ORAL_TABLET | Freq: Three times a day (TID) | ORAL | 0 refills | Status: AC | PRN

## 2019-01-28 NOTE — Telephone Encounter (Signed)
Pt called answering service and left message that Xray order was put in incorrectly or something was wrong.  I tried to call pt back and had to leave a message,  Audelia Acton can you check on the order for her Xray

## 2019-01-28 NOTE — Progress Notes (Signed)
Subjective:     Patient ID: Rebecca Soto, adult   DOB: 1970-09-24, 48 y.o.   MRN: [id number]  This visit type was conducted due to national recommendations for restrictions regarding the COVID-19 Pandemic (e.g. social distancing) in an effort to limit this patient's exposure and mitigate transmission in our community.  Due to their co-morbid illnesses, this patient is at least at moderate risk for complications without adequate follow up.  This format is felt to be most appropriate for this patient at this time.    Documentation for virtual audio and video telecommunications through Zoom encounter:  The patient was located at home. The provider was located in the office. The patient did consent to this visit and is aware of possible charges through their insurance for this visit.  The other persons participating in this telemedicine service were none. Time spent on call was 15 minutes and in review of previous records >15 minutes total.  This virtual service is not related to other E/M service within previous 7 days.   HPI Chief Complaint  Patient presents with  . kidney stone    kidney stone issues, cramping    Virtual consult for possible kidney stone.  She reports 2-day history of low back pain that is spasm-like pain that comes and goes.  Worse yesterday, not as bad today.  She used some leftover tramadol she had but she is on her last pill.  The pain seems to come without warning and although it does hurt some to bend over the pain can be without activity.  She denies blood in the urine.  No cloudy urine.  No fever no nausea no vomiting no diarrhea.  No change in bowel movements.  No urinary frequency or urgency.  She has a history of kidney stones several times in the past.  Last urology consult has been a while, over a year ago.  She is status post hysterectomy but still has her ovaries.  She denies recent injury trauma or fall.  No recent heavy lifting.  No other aggravating  or relieving factors. No other complaint.  Past Medical History:  Diagnosis Date  . Allergy   . Asthma   . Goiter   . Herpes labialis 08/22/2017  . Hypertension 2019   Current Outpatient Medications on File Prior to Visit  Medication Sig Dispense Refill  . acetaminophen (TYLENOL) 325 MG tablet Take 650 mg by mouth every 6 (six) hours as needed for mild pain.    Marland Kitchen albuterol (PROVENTIL HFA;VENTOLIN HFA) 108 (90 Base) MCG/ACT inhaler Inhale 2 puffs into the lungs every 6 (six) hours as needed for wheezing or shortness of breath. 1 Inhaler 1  . amLODipine (NORVASC) 5 MG tablet TAKE 1 TABLET BY MOUTH EVERY DAY 90 tablet 0  . budesonide-formoterol (SYMBICORT) 160-4.5 MCG/ACT inhaler Inhale 2 puffs into the lungs 2 (two) times daily. 1 Inhaler 5  . levothyroxine (SYNTHROID, LEVOTHROID) 75 MCG tablet Take 1 tablet (75 mcg total) by mouth daily before breakfast. 30 tablet 2  . montelukast (SINGULAIR) 10 MG tablet Take 1 tablet (10 mg total) by mouth at bedtime. 90 tablet 1  . valACYclovir (VALTREX) 1000 MG tablet Take 2,000 mg by mouth q12h x 1 day. Start ASAP at onset of symptoms. 30 tablet 2   No current facility-administered medications on file prior to visit.      Review of Systems As in subjective    Objective:   Physical Exam Due to coronavirus pandemic stay at home measures,  patient visit was virtual and they were not examined in person.   Wt 260 lb (117.9 kg)   BMI 42.61 kg/m  General: Well-developed well-nourished no acute stress Otherwise unable to examine due to virtual consult     Assessment:     Encounter Diagnoses  Name Primary?  . Right low back pain, unspecified chronicity, unspecified whether sciatica present Yes  . History of renal stone        Plan:     We discussed limitations of virtual consult.  We discussed possible causes which could include lumbar musculoskeletal etiology, kidney stone, cyst, or other etiologies.  Looking back she had both renal cyst and  renal stones on 2015 CT scan.  I reviewed labs from December 2019.  I will have her come in first in the morning for basic metabolic panel lab.  She will go for KUB upright x-ray to screen for kidney stone.  I advised hydration, she can use tramadol below for pain short-term, and rest.  Advised gentle stretching.  Pending studies we may need to pursue CT scan versus referral to urology.  Rebecca Soto was seen today for kidney stone.  Diagnoses and all orders for this visit:  Right low back pain, unspecified chronicity, unspecified whether sciatica present -     Basic metabolic panel; Future -     DG Abd 1 View; Future  History of renal stone -     Basic metabolic panel; Future -     DG Abd 1 View; Future  Other orders -     traMADol (ULTRAM) 50 MG tablet; Take 1 tablet (50 mg total) by mouth every 8 (eight) hours as needed for up to 5 days.

## 2019-01-29 ENCOUNTER — Other Ambulatory Visit: Payer: Commercial Managed Care - PPO

## 2019-01-29 ENCOUNTER — Encounter: Payer: Self-pay | Admitting: Medical

## 2019-01-29 DIAGNOSIS — Z87442 Personal history of urinary calculi: Secondary | ICD-10-CM

## 2019-01-29 DIAGNOSIS — M545 Low back pain, unspecified: Secondary | ICD-10-CM

## 2019-01-30 LAB — BASIC METABOLIC PANEL
BUN/Creatinine Ratio: 10 (ref 9–23)
BUN: 10 mg/dL (ref 6–24)
CO2: 25 mmol/L (ref 20–29)
Calcium: 9.1 mg/dL (ref 8.7–10.2)
Chloride: 103 mmol/L (ref 96–106)
Creatinine, Ser: 1.01 mg/dL — ABNORMAL HIGH (ref 0.57–1.00)
GFR calc Af Amer: 76 mL/min/{1.73_m2} (ref >59)
GFR calc non Af Amer: 66 mL/min/{1.73_m2} (ref >59)
Glucose: 86 mg/dL (ref 65–99)
Potassium: 3.9 mmol/L (ref 3.5–5.2)
Sodium: 141 mmol/L (ref 134–144)

## 2019-02-03 ENCOUNTER — Telehealth: Payer: Self-pay

## 2019-02-03 NOTE — Telephone Encounter (Signed)
Pt called and stated she had bad back pains yesterday but nothing has come out. She also stated she had feeling of mucus being stuck in her chest. She used Symbicort and felt better but she can still fell it a little. Please advise.

## 2019-02-04 ENCOUNTER — Other Ambulatory Visit: Payer: Self-pay | Admitting: Medical

## 2019-02-04 DIAGNOSIS — R102 Pelvic and perineal pain: Secondary | ICD-10-CM

## 2019-02-04 DIAGNOSIS — R109 Unspecified abdominal pain: Secondary | ICD-10-CM

## 2019-02-04 NOTE — Telephone Encounter (Signed)
Done

## 2019-02-04 NOTE — Telephone Encounter (Signed)
We had discussed if ongoing back/flank pain we would pursue CT abdomen/pelvis.  Lets set this up without contrast as she has contrast allergy

## 2019-02-17 ENCOUNTER — Other Ambulatory Visit: Payer: Self-pay

## 2019-02-17 ENCOUNTER — Other Ambulatory Visit: Payer: Self-pay | Admitting: Medical

## 2019-02-17 ENCOUNTER — Encounter: Payer: Self-pay | Admitting: Medical

## 2019-02-17 ENCOUNTER — Encounter: Payer: Self-pay | Admitting: Family Medicine

## 2019-02-17 ENCOUNTER — Ambulatory Visit: Payer: Commercial Managed Care - PPO | Admitting: Medical

## 2019-02-17 VITALS — BP 130/74 | HR 90 | Temp 99.1°F | Resp 16 | Ht 65.0 in | Wt 266.2 lb

## 2019-02-17 DIAGNOSIS — L255 Unspecified contact dermatitis due to plants, except food: Secondary | ICD-10-CM

## 2019-02-17 MED ORDER — TRIAMCINOLONE ACETONIDE 0.1 % EX CREA: 1.0000 "application " | TOPICAL_CREAM | Freq: Two times a day (BID) | CUTANEOUS | 0 refills | Status: DC

## 2019-02-17 NOTE — Progress Notes (Signed)
Subjective Chief Complaint  Patient presents with  . rash    rash on arm X on and off for 2 weeks   Here for rash on forearms for 2-3 weeks.   Started with possible small insect bites on forearms.   She notes several small flesh colored bumps on both forearms. At times they are more swollen, sometimes less swollen. Itchy.   Has used some OTC hydrocortisone.  The only other new exposure is that she has potatoes plants and zee zee plant at home she has been nurturing.  No other aggravating or relieving factors. No other complaint.  Past Medical History:  Diagnosis Date  . Allergy   . Asthma   . Goiter   . Herpes labialis 08/22/2017  . Hypertension 2019    Current Outpatient Medications on File Prior to Visit  Medication Sig Dispense Refill  . albuterol (PROVENTIL HFA;VENTOLIN HFA) 108 (90 Base) MCG/ACT inhaler Inhale 2 puffs into the lungs every 6 (six) hours as needed for wheezing or shortness of breath. 1 Inhaler 1  . amLODipine (NORVASC) 5 MG tablet TAKE 1 TABLET BY MOUTH EVERY DAY 90 tablet 0  . budesonide-formoterol (SYMBICORT) 160-4.5 MCG/ACT inhaler Inhale 2 puffs into the lungs 2 (two) times daily. 1 Inhaler 5  . levothyroxine (SYNTHROID, LEVOTHROID) 75 MCG tablet Take 1 tablet (75 mcg total) by mouth daily before breakfast. 30 tablet 2  . montelukast (SINGULAIR) 10 MG tablet Take 1 tablet (10 mg total) by mouth at bedtime. 90 tablet 1  . valACYclovir (VALTREX) 1000 MG tablet Take 2,000 mg by mouth q12h x 1 day. Start ASAP at onset of symptoms. 30 tablet 2  . acetaminophen (TYLENOL) 325 MG tablet Take 650 mg by mouth every 6 (six) hours as needed for mild pain.     No current facility-administered medications on file prior to visit.    ROS as in subjective     Objective: BP 130/74   Pulse 90   Temp 99.1 F (37.3 C) (Temporal)   Resp 16   Ht 5\' 5"  (1.651 m)   Wt 266 lb 3.2 oz (120.7 kg)   SpO2 98%   BMI 44.30 kg/m    Gen: wd, wn, nad Skin: bilat forearms with small  several scattered mild urticarial rash anteriorly, otherwise unremarkable     Assessment: Encounter Diagnosis  Name Primary?  . Plant dermatitis Yes     Plan: I suspect plant dermatitis.  Begin cream below for up to 1.5 week if needed, begin OTC antihistamine, avoid plant exposure or use long sleeves when watering her plants.   If not resolving or improving within a week, then let me know.   F/u prn.

## 2019-02-18 ENCOUNTER — Other Ambulatory Visit: Payer: Self-pay | Admitting: Medical

## 2019-02-18 DIAGNOSIS — R109 Unspecified abdominal pain: Secondary | ICD-10-CM

## 2019-02-18 DIAGNOSIS — R102 Pelvic and perineal pain: Secondary | ICD-10-CM

## 2019-02-19 ENCOUNTER — Other Ambulatory Visit: Payer: Self-pay | Admitting: Medical

## 2019-02-19 DIAGNOSIS — R102 Pelvic and perineal pain: Secondary | ICD-10-CM

## 2019-02-19 DIAGNOSIS — R109 Unspecified abdominal pain: Secondary | ICD-10-CM

## 2019-02-25 ENCOUNTER — Ambulatory Visit
Admission: RE | Admit: 2019-02-25 | Discharge: 2019-02-25 | Disposition: A | Discharge status: Home / Self Care | Payer: Commercial Managed Care - PPO | Source: Ambulatory Visit | Attending: Medical | Admitting: Medical

## 2019-02-25 ENCOUNTER — Other Ambulatory Visit: Payer: Self-pay

## 2019-02-25 DIAGNOSIS — R109 Unspecified abdominal pain: Secondary | ICD-10-CM

## 2019-02-25 DIAGNOSIS — R102 Pelvic and perineal pain: Secondary | ICD-10-CM

## 2019-03-18 ENCOUNTER — Telehealth: Payer: Self-pay

## 2019-03-18 NOTE — Telephone Encounter (Signed)
Pt called about her ct results that was sent to her mychart. Patient stated she is now having back pain and some urinary issues. Pt was scheduled for an appointment tomorrow.

## 2019-03-19 ENCOUNTER — Telehealth: Payer: Self-pay | Admitting: Medical

## 2019-03-19 ENCOUNTER — Encounter: Payer: Self-pay | Admitting: Medical

## 2019-03-19 ENCOUNTER — Ambulatory Visit: Payer: Commercial Managed Care - PPO | Admitting: Medical

## 2019-03-19 ENCOUNTER — Other Ambulatory Visit: Payer: Self-pay

## 2019-03-19 VITALS — BP 136/80 | HR 94 | Temp 98.2°F | Ht 65.0 in | Wt 263.3 lb

## 2019-03-19 DIAGNOSIS — R300 Dysuria: Secondary | ICD-10-CM | POA: Diagnosis not present

## 2019-03-19 DIAGNOSIS — M545 Low back pain, unspecified: Secondary | ICD-10-CM

## 2019-03-19 DIAGNOSIS — R350 Frequency of micturition: Secondary | ICD-10-CM | POA: Diagnosis not present

## 2019-03-19 LAB — POCT URINALYSIS DIP (PROADVANTAGE DEVICE)
Glucose, UA: NEGATIVE mg/dL
Leukocytes, UA: NEGATIVE
Nitrite, UA: NEGATIVE
Protein Ur, POC: NEGATIVE mg/dL
Specific Gravity, Urine: 1.015
Urobilinogen, Ur: NEGATIVE
pH, UA: 6.5 (ref 5.0–8.0)

## 2019-03-19 MED ORDER — FLUCONAZOLE 150 MG PO TABS: ORAL_TABLET | ORAL | 0 refills | Status: DC

## 2019-03-19 MED ORDER — SULFAMETHOXAZOLE-TRIMETHOPRIM 800-160 MG PO TABS: 1.0000 | ORAL_TABLET | Freq: Two times a day (BID) | ORAL | 0 refills | Status: DC

## 2019-03-19 NOTE — Telephone Encounter (Signed)
Will see in office tomorrow.

## 2019-03-19 NOTE — Progress Notes (Signed)
Subjective: Chief Complaint  Patient presents with  . Back Pain    frequent urination    Here for frequent urination, pain in low back, all night long up.  Drinking a lot of fluids in the day.  Worried about UTI.  Denies blood in the urine, she does have some change in odor.  No vaginal discharge.  No sexual activity in 2 years.  No concern for STD.  Bowel movements are regular daily.  No fever no nausea or vomiting.  No recent fall or injury.  She does exercise some.  No stretching. No other aggravating or relieving factors. No other complaint.  Past Medical History:  Diagnosis Date  . Allergy   . Asthma   . Goiter   . Herpes labialis 08/22/2017  . Hypertension 2019   Current Outpatient Medications on File Prior to Visit  Medication Sig Dispense Refill  . acetaminophen (TYLENOL) 325 MG tablet Take 650 mg by mouth every 6 (six) hours as needed for mild pain.    Marland Kitchen albuterol (PROVENTIL HFA;VENTOLIN HFA) 108 (90 Base) MCG/ACT inhaler Inhale 2 puffs into the lungs every 6 (six) hours as needed for wheezing or shortness of breath. 1 Inhaler 1  . amLODipine (NORVASC) 5 MG tablet TAKE 1 TABLET BY MOUTH EVERY DAY 90 tablet 0  . budesonide-formoterol (SYMBICORT) 160-4.5 MCG/ACT inhaler Inhale 2 puffs into the lungs 2 (two) times daily. 1 Inhaler 5  . levothyroxine (SYNTHROID, LEVOTHROID) 75 MCG tablet Take 1 tablet (75 mcg total) by mouth daily before breakfast. 30 tablet 2  . montelukast (SINGULAIR) 10 MG tablet Take 1 tablet (10 mg total) by mouth at bedtime. 90 tablet 1  . triamcinolone cream (KENALOG) 0.1 % Apply 1 application topically 2 (two) times daily. 30 g 0  . valACYclovir (VALTREX) 1000 MG tablet Take 2,000 mg by mouth q12h x 1 day. Start ASAP at onset of symptoms. (Patient not taking: Reported on 03/19/2019) 30 tablet 2   No current facility-administered medications on file prior to visit.    ROS as in subjective  Objective: BP 136/80   Pulse 94   Temp 98.2 F (36.8 C) (Oral)    Ht 5\' 5"  (1.651 m)   Wt 263 lb 4.8 oz (119.4 kg)   SpO2 97%   BMI 43.82 kg/m   General appearance: alert, no distress, WD/WN,  Neck: supple, no lymphadenopathy, no thyromegaly, no masses Heart: RRR, normal S1, S2, no murmurs Lungs: CTA bilaterally, no wheezes, rhonchi, or rales Abdomen: +bs, soft, non tender, non distended, no masses, no hepatomegaly, no splenomegaly Pulses: 2+ symmetric, upper and lower extremities, normal cap refill Back: nonteder GU: declined     Assessment: Encounter Diagnoses  Name Primary?  . Frequent urination Yes  . Dysuria   . Low back pain, unspecified back pain laterality, unspecified chronicity, unspecified whether sciatica present     Plan: We discussed her symptoms and concerns.  Urine sent for culture.  Begin empiric Bactrim for possible UTI, gave Diflucan for possible yeast infection as she says she gets yeast infections in the past with antibiotics.  I reviewed her CT scan that was done at a few months ago for kidney stone.  Reviewed recent labs in the chart.  Follow-up pending labs   Nykerria was seen today for back pain.  Diagnoses and all orders for this visit:  Frequent urination -     POCT Urinalysis DIP (Proadvantage Device) -     Urine Culture  Dysuria -  Urine Culture  Low back pain, unspecified back pain laterality, unspecified chronicity, unspecified whether sciatica present -     Urine Culture  Other orders -     sulfamethoxazole-trimethoprim (BACTRIM DS) 800-160 MG tablet; Take 1 tablet by mouth 2 (two) times daily. -     fluconazole (DIFLUCAN) 150 MG tablet; 1 now , may repeat in 1 week

## 2019-03-19 NOTE — Telephone Encounter (Signed)
Pt called and states that she failed to tell you at her office visit today that she will need something for a yeast infection sent in as well. She always has issues when on antibiotics. Please send to [facility]

## 2019-03-20 LAB — URINE CULTURE

## 2019-04-05 ENCOUNTER — Other Ambulatory Visit: Payer: Self-pay

## 2019-04-05 ENCOUNTER — Other Ambulatory Visit (INDEPENDENT_AMBULATORY_CARE_PROVIDER_SITE_OTHER): Payer: Commercial Managed Care - PPO

## 2019-04-05 DIAGNOSIS — Z23 Encounter for immunization: Secondary | ICD-10-CM | POA: Diagnosis not present

## 2019-04-13 ENCOUNTER — Other Ambulatory Visit: Payer: Self-pay | Admitting: Medical

## 2019-05-12 ENCOUNTER — Other Ambulatory Visit: Payer: Self-pay | Admitting: Medical

## 2019-05-31 ENCOUNTER — Ambulatory Visit (INDEPENDENT_AMBULATORY_CARE_PROVIDER_SITE_OTHER): Payer: Commercial Managed Care - PPO

## 2019-05-31 ENCOUNTER — Ambulatory Visit: Payer: Commercial Managed Care - PPO | Admitting: Podiatry

## 2019-05-31 ENCOUNTER — Other Ambulatory Visit: Payer: Self-pay | Admitting: Podiatry

## 2019-05-31 ENCOUNTER — Other Ambulatory Visit: Payer: Self-pay

## 2019-05-31 DIAGNOSIS — N898 Other specified noninflammatory disorders of vagina: Secondary | ICD-10-CM | POA: Insufficient documentation

## 2019-05-31 DIAGNOSIS — B373 Candidiasis of vulva and vagina: Secondary | ICD-10-CM | POA: Insufficient documentation

## 2019-05-31 DIAGNOSIS — M722 Plantar fascial fibromatosis: Secondary | ICD-10-CM

## 2019-05-31 DIAGNOSIS — R319 Hematuria, unspecified: Secondary | ICD-10-CM | POA: Insufficient documentation

## 2019-05-31 DIAGNOSIS — B3731 Acute candidiasis of vulva and vagina: Secondary | ICD-10-CM | POA: Insufficient documentation

## 2019-05-31 DIAGNOSIS — L83 Acanthosis nigricans: Secondary | ICD-10-CM | POA: Insufficient documentation

## 2019-05-31 DIAGNOSIS — M79671 Pain in right foot: Secondary | ICD-10-CM

## 2019-05-31 DIAGNOSIS — K5900 Constipation, unspecified: Secondary | ICD-10-CM | POA: Insufficient documentation

## 2019-05-31 DIAGNOSIS — R8290 Unspecified abnormal findings in urine: Secondary | ICD-10-CM | POA: Insufficient documentation

## 2019-05-31 DIAGNOSIS — M79672 Pain in left foot: Secondary | ICD-10-CM

## 2019-05-31 DIAGNOSIS — J45909 Unspecified asthma, uncomplicated: Secondary | ICD-10-CM | POA: Insufficient documentation

## 2019-05-31 MED ORDER — MELOXICAM 15 MG PO TABS: 15.0000 mg | ORAL_TABLET | Freq: Every day | ORAL | 1 refills | Status: DC

## 2019-06-03 NOTE — Progress Notes (Signed)
   Subjective: 48 y.o. adult presenting today as a new patient with a chief complaint of intermittent right heel pain that began two months ago. Patient states the pain is worse in the morning. Patient reports similar symptoms in the left foot but states it only occurred one time. Patient has not had any treatment. Being on the foot increases the pain. Patient is here for further evaluation and treatment.   Past Medical History:  Diagnosis Date  . Allergy   . Asthma   . Goiter   . Herpes labialis 08/22/2017  . Hypertension 2019     Objective: Physical Exam General: The patient is alert and oriented x3 in no acute distress.  Dermatology: Skin is warm, dry and supple bilateral lower extremities. Negative for open lesions or macerations bilateral.   Vascular: Dorsalis Pedis and Posterior Tibial pulses palpable bilateral.  Capillary fill time is immediate to all digits.  Neurological: Epicritic and protective threshold intact bilateral.   Musculoskeletal: Tenderness to palpation to the plantar aspect of the right heel along the plantar fascia. All other joints range of motion within normal limits bilateral. Strength 5/5 in all groups bilateral.   Radiographic exam: Normal osseous mineralization. Joint spaces preserved. No fracture/dislocation/boney destruction. No other soft tissue abnormalities or radiopaque foreign bodies.   Assessment: 1. Plantar fasciitis right  Plan of Care:  1. Patient evaluated. Xrays reviewed.   2. Injection of 0.5cc Celestone soluspan injected into the right plantar fascia  3. Rx for Meloxicam ordered for patient. 4. Plantar fascial band(s) dispensed 5. Instructed patient regarding therapies and modalities at home to alleviate symptoms.  6. Return to clinic in 4 weeks.     Edrick Kins, DPM [facility]  Dr. Edrick Kins, DPM    [address], [place] [address]                Office  [phone]  Fax [phone]

## 2019-06-28 ENCOUNTER — Other Ambulatory Visit: Payer: Self-pay

## 2019-06-28 ENCOUNTER — Encounter: Payer: Self-pay | Admitting: Podiatry

## 2019-06-28 ENCOUNTER — Ambulatory Visit: Payer: Commercial Managed Care - PPO | Admitting: Podiatry

## 2019-06-28 DIAGNOSIS — M722 Plantar fascial fibromatosis: Secondary | ICD-10-CM | POA: Diagnosis not present

## 2019-06-30 NOTE — Progress Notes (Signed)
   Subjective: 48 y.o. adult presenting today for follow up evaluation of plantar fasciitis of the right foot. Patient states they are doing much better and improving. Patient reports some mild pain two days ago that has since resolved. Patient has been using the plantar fascial brace as directed. There are no aggravating factors noted. Patient is here for further evaluation and treatment.   Past Medical History:  Diagnosis Date  . Allergy   . Asthma   . Goiter   . Herpes labialis 08/22/2017  . Hypertension 2019     Objective: Physical Exam General: The patient is alert and oriented x3 in no acute distress.  Dermatology: Skin is warm, dry and supple bilateral lower extremities. Negative for open lesions or macerations bilateral.   Vascular: Dorsalis Pedis and Posterior Tibial pulses palpable bilateral.  Capillary fill time is immediate to all digits.  Neurological: Epicritic and protective threshold intact bilateral.   Musculoskeletal: Tenderness to palpation to the plantar aspect of the right heel along the plantar fascia. All other joints range of motion within normal limits bilateral. Strength 5/5 in all groups bilateral.    Assessment: 1. Plantar fasciitis right  Plan of Care:  1. Patient evaluated.  2. Injection of 0.5cc Celestone soluspan injected into the right plantar fascia  3. Discontinue taking Meloxicam 15 mg. Recommended OTC Motrin.  4. Continue using plantar fascial brace.  5. Recommended good shoe gear.  6. Return to clinic as needed.    Edrick Kins, DPM [facility]  Dr. Edrick Kins, DPM    [address], [place] [address]                Office [phone]  Fax [phone]

## 2019-07-15 ENCOUNTER — Ambulatory Visit: Payer: Commercial Managed Care - PPO | Admitting: Family Medicine

## 2019-07-15 ENCOUNTER — Other Ambulatory Visit: Payer: Self-pay

## 2019-07-15 VITALS — BP 120/80 | HR 92 | Temp 98.0°F | Wt 261.2 lb

## 2019-07-15 DIAGNOSIS — N3000 Acute cystitis without hematuria: Secondary | ICD-10-CM

## 2019-07-15 DIAGNOSIS — R350 Frequency of micturition: Secondary | ICD-10-CM

## 2019-07-15 DIAGNOSIS — M545 Low back pain, unspecified: Secondary | ICD-10-CM

## 2019-07-15 LAB — POCT URINALYSIS DIP (PROADVANTAGE DEVICE)
Bilirubin, UA: NEGATIVE
Glucose, UA: NEGATIVE mg/dL
Ketones, POC UA: NEGATIVE mg/dL
Leukocytes, UA: NEGATIVE
Nitrite, UA: NEGATIVE
Protein Ur, POC: NEGATIVE mg/dL
Specific Gravity, Urine: 1.015
Urobilinogen, Ur: NEGATIVE
pH, UA: 7 (ref 5.0–8.0)

## 2019-07-15 MED ORDER — SULFAMETHOXAZOLE-TRIMETHOPRIM 800-160 MG PO TABS: 1.0000 | ORAL_TABLET | Freq: Two times a day (BID) | ORAL | 0 refills | Status: DC

## 2019-07-15 MED ORDER — FLUCONAZOLE 150 MG PO TABS: ORAL_TABLET | ORAL | 0 refills | Status: DC

## 2019-07-15 NOTE — Progress Notes (Signed)
   Subjective:    Patient ID: Rebecca Soto, female    DOB: Sep 14, 1970, 48 y.o.   MRN: [id number]  HPI She complains of a 1 week history of frequency, urgency, intermittent back pain but no fever, chills, abdominal pain.   Review of Systems     Objective:   Physical Exam Alert and in no distress.  Abdominal exam shows no palpable tenderness.  Urine microscopic was contaminated, no red or white cells seen..  Dipstick showed 1+ blood.       Assessment & Plan:  Low back pain without sciatica, unspecified back pain laterality, unspecified chronicity - Plan: POCT Urinalysis DIP (Proadvantage Device)  Frequency of urination - Plan: POCT Urinalysis DIP (Proadvantage Device)  Acute cystitis without hematuria - Plan: sulfamethoxazole-trimethoprim (BACTRIM DS) 800-160 MG tablet, fluconazole (DIFLUCAN) 150 MG tablet Also discussed use of Azo-Standard.  She has had difficulty with antibiotics causing a yeast infection and therefore Diflucan given.

## 2019-07-15 NOTE — Patient Instructions (Signed)
Use Azo-Standard to help with your bladder symptoms

## 2019-08-11 ENCOUNTER — Encounter: Payer: Self-pay | Admitting: Medical

## 2019-08-11 ENCOUNTER — Ambulatory Visit: Payer: Commercial Managed Care - PPO | Admitting: Medical

## 2019-08-11 ENCOUNTER — Other Ambulatory Visit: Payer: Self-pay

## 2019-08-11 VITALS — BP 130/88 | HR 94 | Temp 97.7°F | Ht 65.0 in | Wt 266.2 lb

## 2019-08-11 DIAGNOSIS — G5622 Lesion of ulnar nerve, left upper limb: Secondary | ICD-10-CM | POA: Diagnosis not present

## 2019-08-11 DIAGNOSIS — M778 Other enthesopathies, not elsewhere classified: Secondary | ICD-10-CM | POA: Diagnosis not present

## 2019-08-11 DIAGNOSIS — R21 Rash and other nonspecific skin eruption: Secondary | ICD-10-CM

## 2019-08-11 DIAGNOSIS — S63509A Unspecified sprain of unspecified wrist, initial encounter: Secondary | ICD-10-CM | POA: Insufficient documentation

## 2019-08-11 DIAGNOSIS — S56919A Strain of unspecified muscles, fascia and tendons at forearm level, unspecified arm, initial encounter: Secondary | ICD-10-CM

## 2019-08-11 MED ORDER — MONTELUKAST SODIUM 10 MG PO TABS: 10.0000 mg | ORAL_TABLET | Freq: Every day | ORAL | 1 refills | Status: DC

## 2019-08-11 MED ORDER — CYCLOBENZAPRINE HCL 10 MG PO TABS: 10.0000 mg | ORAL_TABLET | Freq: Every day | ORAL | 0 refills | Status: DC

## 2019-08-11 NOTE — Progress Notes (Signed)
Subjective:  Rebecca Soto is a 49 y.o. female who presents for Chief Complaint  Patient presents with  . Arm Pain    bilateral forearm  . toe problem    right great toe turning dark      Here for some arm complaints and rash  She notes for the last several days has been having pains in both forearms.  No swelling of the arms but has felt somewhat swollen in her right wrist.  She also has several weeks of tingling in the left fourth and fifth fingers.  No numbness.  She was working 8-hour days but in recent weeks been working 12-hour days.  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  No neck or shoulder pain.  No fall, no trauma.  She was sleeping on her left side at times but quit doing this since the arm was having burning sensations.  She does not rest her forearms only table  She notes recently having some darker coloration of the skin of her right great toe.  No prior similar issue.  Using no treatment for this.  No other aggravating or relieving factors.    No other c/o.  The following portions of the patient's history were reviewed and updated as appropriate: allergies, current medications, past family history, past medical history, past social history, past surgical history and problem list.  ROS Otherwise as in subjective above  Objective: BP 130/88   Pulse 94   Temp 97.7 F (36.5 C)   Ht 5\' 5"  (1.651 m)   Wt 266 lb 3.2 oz (120.7 kg)   SpO2 98%   BMI 44.30 kg/m   General appearance: alert, no distress, well developed, well nourished Brown rough patch of skin on the dorsal surface of the distal interphalangeal joint of the right great toe, otherwise no skin lesion, normal toe otherwise, normal range of motion, neurovascularly intact Arms with normal pulses and cap refill Wrist fingers and arms without obvious deformity, bilateral forearms with mild tenderness throughout but no specific joint tenderness no joint swelling, normal range of motion  of elbows and wrist, no obvious medial or lateral epicondylitis Questionable positive Phalen's on the left, negative Tinel's, otherwise strength and sensation of arms hands and fingers normal    Assessment: Encounter Diagnoses  Name Primary?  . Strain of forearm, unspecified laterality, initial encounter Yes  . Ulnar nerve compression, left   . Tendonitis of both wrists   . Rash      Plan: We discussed her symptoms and findings.  Advised rest, begin over-the-counter ibuprofen 3 tablets twice daily or 600 mg twice daily for the next 7 days and use, can use Flexeril the next few nights, can use heat and massage of the forearms, and gradual return activity as symptoms subside over the next 1 to 2 weeks.  If not improved within the next several days consider work note for light duty short-term for adequate rest  Ulnar nerve compression of left arm likely due to positioning in bed.  Advised not lying on left arm, use a towel to splint the arm relatively straight at bedtime, ibuprofen as above, relative rest.  Symptoms may take a few weeks to gradually resolve.  If no improvement at all within a week consider adding gabapentin short-term  Tendinitis of both wrist-treatment plan as above, rest, ibuprofen  Rash of toe-advised Shea or cocoa butter daily for the next 2 weeks.  Recheck if not resolving.  Lacandice was seen today for arm pain and toe problem.  Diagnoses and all orders for this visit:  Strain of forearm, unspecified laterality, initial encounter  Ulnar nerve compression, left  Tendonitis of both wrists  Rash  Other orders -     cyclobenzaprine (FLEXERIL) 10 MG tablet; Take 1 tablet (10 mg total) by mouth at bedtime. prn -     montelukast (SINGULAIR) 10 MG tablet; Take 1 tablet (10 mg total) by mouth at bedtime.    Follow up: 2wk

## 2019-08-12 ENCOUNTER — Other Ambulatory Visit: Payer: Self-pay | Admitting: Medical

## 2019-08-13 NOTE — Telephone Encounter (Signed)
[facility] Please advise .[place]

## 2019-08-16 ENCOUNTER — Other Ambulatory Visit: Payer: Self-pay | Admitting: Medical

## 2019-08-23 ENCOUNTER — Other Ambulatory Visit: Payer: Self-pay | Admitting: Medical

## 2019-08-23 DIAGNOSIS — Z1231 Encounter for screening mammogram for malignant neoplasm of breast: Secondary | ICD-10-CM

## 2019-09-09 ENCOUNTER — Ambulatory Visit: Payer: Commercial Managed Care - PPO

## 2019-09-13 ENCOUNTER — Ambulatory Visit: Payer: Commercial Managed Care - PPO | Attending: Family

## 2019-09-13 DIAGNOSIS — Z23 Encounter for immunization: Secondary | ICD-10-CM

## 2019-09-13 NOTE — Progress Notes (Signed)
   Covid-19 Vaccination Clinic  Name:  Juliane Aaker    MRN: [id number] DOB: September 17, 1970  09/13/2019  Ms. Widen was observed post Covid-19 immunization for 15 minutes without incidence. She was provided with Vaccine Information Sheet and instruction to access the V-Safe system.   Ms. Blee was instructed to call 911 with any severe reactions post vaccine: Marland Kitchen Difficulty breathing  . Swelling of your face and throat  . A fast heartbeat  . A bad rash all over your body  . Dizziness and weakness    Immunizations Administered    Name Date Dose VIS Date Route   Moderna COVID-19 Vaccine 09/13/2019 10:52 AM 0.5 mL 06/22/2019 Intramuscular   Manufacturer: Moderna   Lot: [id number]   [place]:[id number]

## 2019-09-30 ENCOUNTER — Ambulatory Visit
Admission: RE | Admit: 2019-09-30 | Discharge: 2019-09-30 | Disposition: A | Discharge status: Home / Self Care | Payer: Commercial Managed Care - PPO | Source: Ambulatory Visit | Attending: Medical | Admitting: Medical

## 2019-09-30 ENCOUNTER — Other Ambulatory Visit: Payer: Self-pay

## 2019-09-30 DIAGNOSIS — Z1231 Encounter for screening mammogram for malignant neoplasm of breast: Secondary | ICD-10-CM

## 2019-10-12 ENCOUNTER — Ambulatory Visit: Payer: Commercial Managed Care - PPO | Attending: Family

## 2019-10-12 DIAGNOSIS — Z23 Encounter for immunization: Secondary | ICD-10-CM

## 2019-10-12 NOTE — Progress Notes (Signed)
   Covid-19 Vaccination Clinic  Name:  Rebecca Soto    MRN: [id number] DOB: 1971-05-16  10/12/2019  [name] was observed post Covid-19 immunization for 15 minutes without incident. She was provided with Vaccine Information Sheet and instruction to access the V-Safe system.   [name] was instructed to call 911 with any severe reactions post vaccine: Marland Kitchen Difficulty breathing  . Swelling of face and throat  . A fast heartbeat  . A bad rash all over body  . Dizziness and weakness   Immunizations Administered    Name Date Dose VIS Date Route   Moderna COVID-19 Vaccine 10/12/2019 10:22 AM 0.5 mL 06/22/2019 Intramuscular   Manufacturer: Moderna   Lot: [id number]   [place]:[id number]

## 2019-10-14 ENCOUNTER — Encounter: Payer: Self-pay | Admitting: Medical

## 2019-10-14 ENCOUNTER — Ambulatory Visit: Payer: Commercial Managed Care - PPO | Admitting: Medical

## 2019-10-14 ENCOUNTER — Other Ambulatory Visit: Payer: Self-pay

## 2019-10-14 VITALS — Ht 65.0 in | Wt 262.0 lb

## 2019-10-14 DIAGNOSIS — T50Z95A Adverse effect of other vaccines and biological substances, initial encounter: Secondary | ICD-10-CM

## 2019-10-14 DIAGNOSIS — R6883 Chills (without fever): Secondary | ICD-10-CM | POA: Diagnosis not present

## 2019-10-14 DIAGNOSIS — R52 Pain, unspecified: Secondary | ICD-10-CM | POA: Diagnosis not present

## 2019-10-14 NOTE — Progress Notes (Signed)
done

## 2019-10-14 NOTE — Progress Notes (Signed)
Subjective:     Patient ID: Rebecca Soto, female   DOB: 1971/01/25, 49 y.o.   MRN: [id number]  This visit type was conducted due to national recommendations for restrictions regarding the COVID-19 Pandemic (e.g. social distancing) in an effort to limit this patient's exposure and mitigate transmission in our community.  Due to their co-morbid illnesses, this patient is at least at moderate risk for complications without adequate follow up.  This format is felt to be most appropriate for this patient at this time.    Documentation for virtual audio and video telecommunications through Zoom encounter:  The patient was located at home. The provider was located in the office. The patient did consent to this visit and is aware of possible charges through their insurance for this visit.  The other persons participating in this telemedicine service were none. Time spent on call was 20 minutes and in review of previous records 20 minutes total.  This virtual service is not related to other E/M service within previous 7 days.   HPI Chief Complaint  Patient presents with  . Allergic Reaction    to covid vaccine-bodyache    Had second dose of Moderna vaccine 2 days ago.  She notes 2 day history of chills, body aches, fever up to 101, arm soreness, horrible headache.  Feels rough.   Has used some Tylenol.  Had no issues with the first vaccine other than arm soreness for a few days.  No other symptoms.  No sick contacts.  Missed work yesterday and today.   No other aggravating or relieving factors. No other complaint.  Past Medical History:  Diagnosis Date  . Allergy   . Asthma   . Goiter   . Herpes labialis 08/22/2017  . Hypertension 2019   Current Outpatient Medications on File Prior to Visit  Medication Sig Dispense Refill  . acetaminophen (TYLENOL) 325 MG tablet Take 650 mg by mouth every 6 (six) hours as needed for mild pain.    Marland Kitchen albuterol (PROVENTIL HFA;VENTOLIN HFA) 108 (90  Base) MCG/ACT inhaler Inhale 2 puffs into the lungs every 6 (six) hours as needed for wheezing or shortness of breath. 1 Inhaler 1  . amLODipine (NORVASC) 5 MG tablet TAKE 1 TABLET BY MOUTH EVERY DAY 90 tablet 0  . cyclobenzaprine (FLEXERIL) 10 MG tablet Take 1 tablet (10 mg total) by mouth at bedtime. prn 10 tablet 0  . levothyroxine (SYNTHROID, LEVOTHROID) 75 MCG tablet Take 1 tablet (75 mcg total) by mouth daily before breakfast. 30 tablet 2  . montelukast (SINGULAIR) 10 MG tablet Take 1 tablet (10 mg total) by mouth at bedtime. 90 tablet 1  . SYMBICORT 160-4.5 MCG/ACT inhaler TAKE 2 PUFFS BY MOUTH TWICE A DAY 30.6 Inhaler 1  . fluconazole (DIFLUCAN) 150 MG tablet 1 now , may repeat in 1 week (Patient not taking: Reported on 08/11/2019) 2 tablet 0  . sulfamethoxazole-trimethoprim (BACTRIM DS) 800-160 MG tablet Take 1 tablet by mouth 2 (two) times daily. (Patient not taking: Reported on 08/11/2019) 20 tablet 0  . triamcinolone cream (KENALOG) 0.1 % Apply 1 application topically 2 (two) times daily. (Patient not taking: Reported on 08/11/2019) 30 g 0  . valACYclovir (VALTREX) 1000 MG tablet Take 2,000 mg by mouth q12h x 1 day. Start ASAP at onset of symptoms. (Patient not taking: Reported on 08/11/2019) 30 tablet 2   No current facility-administered medications on file prior to visit.     Review of Systems As in subjective  Objective:   Physical Exam Due to coronavirus pandemic stay at home measures, patient visit was virtual and they were not examined in person.   Ht 5\' 5"  (1.651 m)   Wt 262 lb (118.8 kg)   BMI 43.60 kg/m       Assessment:     Encounter Diagnoses  Name Primary?  . Vaccine reaction, initial encounter Yes  . Body aches   . Chills        Plan:     We discussed her symptoms which seem to be a somewhat typical reaction to a vaccine or at least to the Covid vaccine 2nd dose we have seen in several patients.  No recent sick exposures, no reason to suspect infection.   She also had the 1st dose already so has protection from that vaccine as well.  I advised rest, hydration, Tylenol over-the-counter every 4-6 hours the next day or 2.  I wrote her a note out of work yesterday and today and going back tomorrow with light duty given the body aches.  I advise she call if any other questions or new symptoms or concerns  Rebecca Soto was seen today for allergic reaction.  Diagnoses and all orders for this visit:  Vaccine reaction, initial encounter  Body aches  Chills

## 2019-10-25 ENCOUNTER — Telehealth: Payer: Self-pay | Admitting: Medical

## 2019-10-25 NOTE — Telephone Encounter (Signed)
Pt called and wanted to see if she could come in today and get some labs done to check her thyroid and iron. Pt stated that she has been very tired and normally when this happens something is low

## 2019-10-25 NOTE — Telephone Encounter (Signed)
Schedule her with Rebecca Soto for sometime this week

## 2019-10-27 NOTE — Telephone Encounter (Signed)
Pt appointment was made Friday . [place]

## 2019-10-29 ENCOUNTER — Encounter: Payer: Self-pay | Admitting: Medical

## 2019-10-29 ENCOUNTER — Ambulatory Visit: Payer: Commercial Managed Care - PPO | Admitting: Medical

## 2019-10-29 ENCOUNTER — Other Ambulatory Visit: Payer: Self-pay

## 2019-10-29 VITALS — BP 138/90 | HR 92 | Temp 97.8°F | Ht 65.0 in | Wt 261.8 lb

## 2019-10-29 DIAGNOSIS — E049 Nontoxic goiter, unspecified: Secondary | ICD-10-CM | POA: Diagnosis not present

## 2019-10-29 DIAGNOSIS — M545 Low back pain, unspecified: Secondary | ICD-10-CM

## 2019-10-29 DIAGNOSIS — J4540 Moderate persistent asthma, uncomplicated: Secondary | ICD-10-CM | POA: Diagnosis not present

## 2019-10-29 DIAGNOSIS — E039 Hypothyroidism, unspecified: Secondary | ICD-10-CM

## 2019-10-29 DIAGNOSIS — M25549 Pain in joints of unspecified hand: Secondary | ICD-10-CM

## 2019-10-29 DIAGNOSIS — M79639 Pain in unspecified forearm: Secondary | ICD-10-CM

## 2019-10-29 DIAGNOSIS — M25529 Pain in unspecified elbow: Secondary | ICD-10-CM

## 2019-10-29 DIAGNOSIS — G479 Sleep disorder, unspecified: Secondary | ICD-10-CM

## 2019-10-29 DIAGNOSIS — I10 Essential (primary) hypertension: Secondary | ICD-10-CM

## 2019-10-29 DIAGNOSIS — R5383 Other fatigue: Secondary | ICD-10-CM

## 2019-10-29 DIAGNOSIS — R400 Somnolence: Secondary | ICD-10-CM

## 2019-10-29 LAB — POCT URINALYSIS DIP (PROADVANTAGE DEVICE)
Glucose, UA: NEGATIVE mg/dL
Ketones, POC UA: NEGATIVE mg/dL
Leukocytes, UA: NEGATIVE
Nitrite, UA: NEGATIVE
Specific Gravity, Urine: 1.015
Urobilinogen, Ur: NEGATIVE
pH, UA: 7 (ref 5.0–8.0)

## 2019-10-29 NOTE — Progress Notes (Signed)
Subjective: Chief Complaint  Patient presents with  . Fatigue    always sleeping  . Back Pain    left side of lower back    Here for several symptoms.  Has hx/o arm pain, numbness.   Still having pains in forearms, feels tired and fatigued.  Still ongoing numbness in both hands 3rd, 4th and 5th fingers.  Gets pains in elbows, forearms.  No ortho visit here in [place] but has seen ortho in the past out of state and had injection in thumb for tendonitis.  If sits for a period goes to sleep easily.   Falls asleep watching TV.  Dragging in the morning, fatigue.  No snoring, no witnessed apnea.  Still having some back pains, even though she had passed kidney stone few months ago.    Asthma- uses Symbicort in the morning, not BID.   Using albuterol typical once daily.  No blood in urine, no cloudy or odor in urine but is a little darker than usual.  No burning with urination.   No other aggravating or relieving factors. No other complaint.   Past Medical History:  Diagnosis Date  . Allergy   . Asthma   . Goiter   . Herpes labialis 08/22/2017  . Hypertension 2019   Current Outpatient Medications on File Prior to Visit  Medication Sig Dispense Refill  . albuterol (PROVENTIL HFA;VENTOLIN HFA) 108 (90 Base) MCG/ACT inhaler Inhale 2 puffs into the lungs every 6 (six) hours as needed for wheezing or shortness of breath. 1 Inhaler 1  . amLODipine (NORVASC) 5 MG tablet TAKE 1 TABLET BY MOUTH EVERY DAY 90 tablet 0  . levothyroxine (SYNTHROID, LEVOTHROID) 75 MCG tablet Take 1 tablet (75 mcg total) by mouth daily before breakfast. 30 tablet 2  . montelukast (SINGULAIR) 10 MG tablet Take 1 tablet (10 mg total) by mouth at bedtime. 90 tablet 1  . SYMBICORT 160-4.5 MCG/ACT inhaler TAKE 2 PUFFS BY MOUTH TWICE A DAY 30.6 Inhaler 1  . triamcinolone cream (KENALOG) 0.1 % Apply 1 application topically 2 (two) times daily. (Patient not taking: Reported on 08/11/2019) 30 g 0  . valACYclovir (VALTREX) 1000  MG tablet Take 2,000 mg by mouth q12h x 1 day. Start ASAP at onset of symptoms. (Patient not taking: Reported on 08/11/2019) 30 tablet 2   No current facility-administered medications on file prior to visit.    Family History  Problem Relation Age of Onset  . Cancer Other   . Hypertension Other   . Hyperlipidemia Other   . Hypertension Mother   . Heart disease Mother        cardiomegaly  . Other Mother        brain tumor  . Aneurysm Mother        brain  . Hypertension Father   . Cancer Father        throat  . Hypertension Brother   . Diabetes Brother   . Diabetes Maternal Grandmother   . Hypertension Maternal Grandmother   . Hypertension Maternal Grandfather   . Hypertension Sister   . Hypertension Paternal Grandmother   . Hypertension Paternal Grandfather   . Hypertension Sister   . Diabetes Brother   . Hypertension Brother   . Diabetes Brother   . Hypertension Brother   . Diabetes Brother   . Hypertension Brother   . Diabetes Brother   . Hypertension Brother   . Diabetes Brother   . Hypertension Brother   . Hypertension Brother  ROS as in subjective     Objective: BP 138/90   Pulse 92   Temp 97.8 F (36.6 C)   Ht 5\' 5"  (1.651 m)   Wt 261 lb 12.8 oz (118.8 kg)   SpO2 97%   BMI 43.57 kg/m   Wt Readings from Last 3 Encounters:  10/29/19 261 lb 12.8 oz (118.8 kg)  10/14/19 262 lb (118.8 kg)  08/11/19 266 lb 3.2 oz (120.7 kg)   BP Readings from Last 3 Encounters:  10/29/19 138/90  08/11/19 130/88  07/15/19 120/80   General appearence: alert, no distress, WD/WN, African American female Neck: supple, no lymphadenopathy, mild thyromegaly, no masses Heart: RRR, normal S1, S2, no murmurs Lungs: CTA bilaterally, no wheezes, rhonchi, or rales Abdomen: +bs, soft, non tender, non distended, no masses, no hepatomegaly, no splenomegaly Back: nontender Pulses: 2+ symmetric, upper and lower extremities, normal cap refill Ext:no edema +tinels left hand,  otherwise nontender with arms, wrist and fingers, normal ROM of arms Arms neurovascularly intact otherwise      Assessment: Encounter Diagnoses  Name Primary?  . Essential hypertension, benign Yes  . Moderate persistent asthma without complication   . Hypothyroidism, unspecified type   . Goiter   . Low back pain, unspecified back pain laterality, unspecified chronicity, unspecified whether sciatica present   . Daytime somnolence   . Sleep disturbance   . Fatigue, unspecified type   . Arthralgia of hand, unspecified laterality   . Pain in forearm, unspecified laterality   . Elbow pain, unspecified laterality      Plan: Discussed symptoms, concerns . Labs today.  Consider sleep study.  Discussed getting daily exercise, good sleep, discussed stretching daily.   F/u pending labs  Leannie was seen today for fatigue and back pain.  Diagnoses and all orders for this visit:  Essential hypertension, benign -     TSH -     T4, free -     CBC with Differential/Platelet -     Basic metabolic panel  Moderate persistent asthma without complication  Hypothyroidism, unspecified type -     TSH -     T4, free  Goiter  Low back pain, unspecified back pain laterality, unspecified chronicity, unspecified whether sciatica present -     POCT Urinalysis DIP (Proadvantage Device)  Daytime somnolence  Sleep disturbance  Fatigue, unspecified type -     TSH -     T4, free -     CBC with Differential/Platelet -     Basic metabolic panel -     Sedimentation rate  Arthralgia of hand, unspecified laterality -     Sedimentation rate  Pain in forearm, unspecified laterality -     Sedimentation rate  Elbow pain, unspecified laterality -     Sedimentation rate   Pending labs, and yearly for physical

## 2019-10-30 LAB — CBC WITH DIFFERENTIAL/PLATELET
Basophils Absolute: 0.1 10*3/uL (ref 0.0–0.2)
Basos: 1 %
EOS (ABSOLUTE): 0.1 10*3/uL (ref 0.0–0.4)
Eos: 3 %
Hematocrit: 41.4 % (ref 34.0–46.6)
Hemoglobin: 13.7 g/dL (ref 11.1–15.9)
Immature Grans (Abs): 0 10*3/uL (ref 0.0–0.1)
Immature Granulocytes: 0 %
Lymphocytes Absolute: 1.9 10*3/uL (ref 0.7–3.1)
Lymphs: 48 %
MCH: 29.8 pg (ref 26.6–33.0)
MCHC: 33.1 g/dL (ref 31.5–35.7)
MCV: 90 fL (ref 79–97)
Monocytes Absolute: 0.4 10*3/uL (ref 0.1–0.9)
Monocytes: 9 %
Neutrophils Absolute: 1.6 10*3/uL (ref 1.4–7.0)
Neutrophils: 39 %
Platelets: 365 10*3/uL (ref 150–450)
RBC: 4.59 x10E6/uL (ref 3.77–5.28)
RDW: 12.7 % (ref 11.7–15.4)
WBC: 4.1 10*3/uL (ref 3.4–10.8)

## 2019-10-30 LAB — BASIC METABOLIC PANEL
BUN/Creatinine Ratio: 9 (ref 9–23)
BUN: 8 mg/dL (ref 6–24)
CO2: 21 mmol/L (ref 20–29)
Calcium: 9 mg/dL (ref 8.7–10.2)
Chloride: 104 mmol/L (ref 96–106)
Creatinine, Ser: 0.89 mg/dL (ref 0.57–1.00)
GFR calc Af Amer: 89 mL/min/{1.73_m2} (ref >59)
GFR calc non Af Amer: 77 mL/min/{1.73_m2} (ref >59)
Glucose: 89 mg/dL (ref 65–99)
Potassium: 3.9 mmol/L (ref 3.5–5.2)
Sodium: 139 mmol/L (ref 134–144)

## 2019-10-30 LAB — TSH: TSH: 0.931 u[IU]/mL (ref 0.450–4.500)

## 2019-10-30 LAB — SEDIMENTATION RATE: Sed Rate: 30 mm/hr (ref 0–32)

## 2019-10-30 LAB — T4, FREE: Free T4: 1.13 ng/dL (ref 0.82–1.77)

## 2019-11-09 ENCOUNTER — Other Ambulatory Visit: Payer: Self-pay | Admitting: Medical

## 2019-11-17 ENCOUNTER — Telehealth: Payer: Self-pay | Admitting: Medical

## 2019-11-17 NOTE — Telephone Encounter (Signed)
See her email about dates to schedule sleep study

## 2019-11-18 NOTE — Telephone Encounter (Signed)
Order sent.

## 2019-12-15 ENCOUNTER — Telehealth: Payer: Self-pay | Admitting: Medical

## 2019-12-15 NOTE — Telephone Encounter (Signed)
Sent patient a message via mychart to call and schedule an appointment.

## 2019-12-15 NOTE — Telephone Encounter (Signed)
Schedule f/u to discuss sleep study  FYI for [facility] Sleep study showed mild sleep apnea, AHI 9.3, significant oxygen desaturation, lowest desat 79%,

## 2019-12-28 ENCOUNTER — Encounter: Payer: Self-pay | Admitting: Medical

## 2020-01-03 ENCOUNTER — Other Ambulatory Visit: Payer: Self-pay | Admitting: Medical

## 2020-03-04 ENCOUNTER — Other Ambulatory Visit: Payer: Self-pay | Admitting: Medical

## 2020-03-14 LAB — RESULTS CONSOLE HPV: CHL HPV: NEGATIVE

## 2020-03-14 LAB — HM PAP SMEAR: HM Pap smear: NEGATIVE

## 2020-04-14 ENCOUNTER — Other Ambulatory Visit: Payer: Self-pay | Admitting: Medical

## 2020-04-20 ENCOUNTER — Telehealth: Payer: Self-pay | Admitting: Medical

## 2020-04-20 NOTE — Telephone Encounter (Signed)
Pt called and wants to go ahead and get the process started to get a colonoscopy. I was told she needed an order before a referral can be done

## 2020-04-20 NOTE — Telephone Encounter (Signed)
Please put an order in refer, see other message about colonoscopy

## 2020-04-21 ENCOUNTER — Other Ambulatory Visit: Payer: Self-pay

## 2020-04-21 DIAGNOSIS — Z1211 Encounter for screening for malignant neoplasm of colon: Secondary | ICD-10-CM

## 2020-04-21 NOTE — Telephone Encounter (Signed)
Order and referral has been placed.

## 2020-05-04 ENCOUNTER — Ambulatory Visit: Payer: Commercial Managed Care - PPO | Admitting: Medical

## 2020-06-04 ENCOUNTER — Encounter (HOSPITAL_COMMUNITY): Payer: Self-pay | Admitting: *Deleted

## 2020-06-04 ENCOUNTER — Ambulatory Visit (HOSPITAL_COMMUNITY)
Admission: EM | Admit: 2020-06-04 | Discharge: 2020-06-04 | Disposition: A | Discharge status: Home / Self Care | Payer: Commercial Managed Care - PPO | Attending: Emergency Medicine | Admitting: Emergency Medicine

## 2020-06-04 ENCOUNTER — Other Ambulatory Visit: Payer: Self-pay

## 2020-06-04 DIAGNOSIS — M549 Dorsalgia, unspecified: Secondary | ICD-10-CM

## 2020-06-04 DIAGNOSIS — R109 Unspecified abdominal pain: Secondary | ICD-10-CM | POA: Insufficient documentation

## 2020-06-04 DIAGNOSIS — R350 Frequency of micturition: Secondary | ICD-10-CM | POA: Insufficient documentation

## 2020-06-04 DIAGNOSIS — N200 Calculus of kidney: Secondary | ICD-10-CM | POA: Insufficient documentation

## 2020-06-04 LAB — POCT URINALYSIS DIPSTICK, ED / UC
Bilirubin Urine: NEGATIVE
Glucose, UA: NEGATIVE mg/dL
Ketones, ur: NEGATIVE mg/dL
Leukocytes,Ua: NEGATIVE
Nitrite: NEGATIVE
Protein, ur: NEGATIVE mg/dL
Specific Gravity, Urine: 1.02 (ref 1.005–1.030)
Urobilinogen, UA: 0.2 mg/dL (ref 0.0–1.0)
pH: 7 (ref 5.0–8.0)

## 2020-06-04 MED ORDER — KETOROLAC TROMETHAMINE 30 MG/ML IJ SOLN
INTRAMUSCULAR | Status: AC
  Filled 2020-06-04: qty 1

## 2020-06-04 MED ORDER — HYDROCODONE-ACETAMINOPHEN 5-325 MG PO TABS
1.0000 | ORAL_TABLET | Freq: Four times a day (QID) | ORAL | 0 refills | Status: DC | PRN
Start: 2020-06-04 — End: 2020-11-23

## 2020-06-04 MED ORDER — TAMSULOSIN HCL 0.4 MG PO CAPS: 0.4000 mg | ORAL_CAPSULE | Freq: Every day | ORAL | 0 refills | Status: DC

## 2020-06-04 MED ORDER — KETOROLAC TROMETHAMINE 30 MG/ML IJ SOLN
30.0000 mg | Freq: Once | INTRAMUSCULAR | Status: AC
  Administered 2020-06-04: 30 mg via INTRAMUSCULAR

## 2020-06-04 MED ORDER — NAPROXEN 500 MG PO TABS: 500.0000 mg | ORAL_TABLET | Freq: Two times a day (BID) | ORAL | 0 refills | Status: DC

## 2020-06-04 MED ORDER — ONDANSETRON 4 MG PO TBDP: 4.0000 mg | ORAL_TABLET | Freq: Three times a day (TID) | ORAL | 0 refills | Status: DC | PRN

## 2020-06-04 NOTE — ED Triage Notes (Signed)
Pt reports ABD pain and Back pain started on Friday. Pt denies any injury to cause pain . Pt does report dysuria and frequency . Pt has had a kidney stone in the past .

## 2020-06-04 NOTE — Discharge Instructions (Signed)
I am treating you for a kidney stone We gave you a shot of Toradol today to help with pain Begin Flomax daily to help pass stone Naprosyn twice daily for pain Hydrocodone for severe pain/bedtime, use sparingly, do not drive or work after taking Zofran as needed for nausea Strain urine and monitor for passing of any stone/sediment/debris If symptoms progressing or worsening please follow-up here or emergency room

## 2020-06-04 NOTE — ED Provider Notes (Signed)
[facility]    CSN: [id number] Arrival date & time: 06/04/20  1037      History   Chief Complaint Chief Complaint  Patient presents with   Back Pain   Abdominal Pain    HPI Rebecca Soto is a 49 y.o. female history of hypertension, prior hysterectomy presenting today for evaluation of abdominal and back pain.  Patient reports that symptoms began on Friday, persistent for the past 2 to 3 days.  She denies any injury or trauma.  She does also report dysuria and urinary frequency.  Reports history of prior kidney stone.  Reports developed left-sided back pain yesterday and worsened overnight.  Has also had some lower abdominal pain and pressure.  Reports urinary frequency without dysuria.  Denies fever.  Has had some nausea without vomiting.  Symptoms feel similar to prior kidney stone.  Has had 2 prior stones which have passed without intervention.  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   Tendonitis of both wrists 08/11/2019   Rash 08/11/2019   Strain of forearm 08/11/2019   Acanthosis nigricans 05/31/2019   Blood in urine 05/31/2019   Candidal vulvovaginitis 05/31/2019   Constipation 05/31/2019   Urine finding 05/31/2019   Vaginal discharge 05/31/2019   Dysuria 03/19/2019   Hx of seasonal allergies 10/15/2018   Nocturia 10/15/2018   Muscle strain of upper back 10/15/2018   Gastroesophageal reflux disease 10/15/2018   Allergy    Moderate persistent asthma without complication 90/24/0973   Routine general medical examination at a health  care facility 08/14/2018   Need for Tdap vaccination 08/14/2018   Nonintractable headache 08/14/2018   Abnormal mammogram 08/14/2018   Goiter 08/14/2018   Renal cyst 08/14/2018   Essential hypertension, benign 07/23/2018   Family history of cancer 04/17/2018   Herpes labialis 08/22/2017   Low back pain 06/07/2016   Frequent urination 06/07/2016   History of renal stone 06/07/2016   Hypothyroidism 06/07/2016   Other and unspecified ovarian cyst 01/12/2013   Pelvic mass 07/23/2007    Past Surgical History:  Procedure Laterality Date   ABDOMINAL HYSTERECTOMY  2008   still has ovaries, fibroids    OB History   No obstetric history on file.      Home Medications    Prior to Admission medications   Medication Sig Start Date End Date Taking? Authorizing Provider  albuterol (PROVENTIL HFA;VENTOLIN HFA) 108 (90 Base) MCG/ACT inhaler Inhale 2 puffs into the lungs every 6 (six) hours as needed for wheezing or shortness of breath. 07/07/18  Yes Tysinger, Camelia Eng, PA-C  amLODipine (NORVASC) 5 MG tablet TAKE 1 TABLET BY MOUTH EVERY DAY 04/14/20  Yes Tysinger, Camelia Eng, PA-C  levothyroxine (SYNTHROID, LEVOTHROID) 75 MCG tablet Take 1 tablet (75 mcg total) by mouth daily before breakfast. 08/18/18  Yes Tysinger, Camelia Eng, PA-C  montelukast (SINGULAIR) 10 MG tablet TAKE 1 TABLET BY MOUTH EVERYDAY AT BEDTIME 03/06/20  Yes Tysinger, Camelia Eng, PA-C  SYMBICORT 160-4.5 MCG/ACT inhaler TAKE 2 PUFFS BY MOUTH TWICE A DAY 04/14/20  Yes Tysinger, Camelia Eng, PA-C  HYDROcodone-acetaminophen (NORCO/VICODIN) 5-325 MG tablet Take 1-2 tablets by mouth every 6 (six) hours as needed. 06/04/20   Harlowe Dowler C, PA-C  naproxen (NAPROSYN) 500 MG tablet Take 1 tablet (500 mg total) by mouth 2 (two) times daily. 06/04/20   Trella Thurmond C, PA-C  ondansetron (ZOFRAN ODT) 4 MG disintegrating tablet Take 1 tablet (4 mg total) by mouth every 8 (eight) hours as needed for nausea or vomiting. 06/04/20    Aisea Bouldin C, PA-C  tamsulosin (FLOMAX) 0.4 MG CAPS capsule Take 1 capsule (0.4 mg total) by mouth daily. 06/04/20   Willett Lefeber C, PA-C  triamcinolone cream (KENALOG) 0.1 % Apply 1 application topically 2 (two) times daily. Patient not taking: Reported on 08/11/2019 02/17/19   Tysinger, Camelia Eng, PA-C  valACYclovir (VALTREX) 1000 MG tablet Take 2,000 mg by mouth q12h x 1 day. Start ASAP at onset of symptoms. Patient not taking: Reported on 08/11/2019 08/22/17   Girtha Rm, NP-C    Family History Family History  Problem Relation Age of Onset   Cancer Other    Hypertension Other    Hyperlipidemia Other    Hypertension Mother    Heart disease Mother        cardiomegaly   Other Mother        brain tumor   Aneurysm Mother        brain   Hypertension Father    Cancer Father        throat   Hypertension Brother    Diabetes Brother    Diabetes Maternal Grandmother    Hypertension Maternal Grandmother    Hypertension Maternal Grandfather    Hypertension Sister    Hypertension Paternal Grandmother    Hypertension Paternal Grandfather    Hypertension Sister    Diabetes Brother    Hypertension Brother    Diabetes Brother    Hypertension Brother    Diabetes Brother    Hypertension Brother    Diabetes Brother    Hypertension Brother    Diabetes Brother    Hypertension Brother    Hypertension Brother     Social History Social History   Tobacco Use   Smoking status: Never Smoker   Smokeless tobacco: Never Used  Scientific laboratory technician Use: Never used  Substance Use Topics   Alcohol use: Yes    Alcohol/week: 2.0 standard drinks    Types: 2 Glasses of wine per week   Drug use: No     Allergies   Other, Iohexol, Aspirin, Chlorpheniramine, Diphenhydramine hcl, and Latex   Review of Systems Review of Systems  Constitutional: Negative for fever.  Respiratory: Negative for shortness of breath.   Cardiovascular: Negative for chest  pain.  Gastrointestinal: Positive for abdominal pain and nausea. Negative for diarrhea and vomiting.  Genitourinary: Positive for frequency. Negative for dysuria, flank pain, genital sores, hematuria, menstrual problem, vaginal bleeding, vaginal discharge and vaginal pain.  Musculoskeletal: Positive for back pain.  Skin: Negative for rash.  Neurological: Negative for dizziness, light-headedness and headaches.     Physical Exam Triage Vital Signs ED Triage Vitals  Enc Vitals Group     BP      Pulse      Resp      Temp      Temp src      SpO2      Weight      Height      Head Circumference  Peak Flow      Pain Score      Pain Loc      Pain Edu?      Excl. in [place]?    No data found.  Updated Vital Signs BP (!) 141/90 (BP Location: Right Arm)    Pulse 93    Temp 98.8 F (37.1 C) (Oral)    Resp 18    Ht 5\' 5"  (1.651 m)    Wt 244 lb (110.7 kg)    SpO2 99%    BMI 40.60 kg/m   Visual Acuity Right Eye Distance:   Left Eye Distance:   Bilateral Distance:    Right Eye Near:   Left Eye Near:    Bilateral Near:     Physical Exam Vitals and nursing note reviewed.  Constitutional:      Appearance: She is well-developed.     Comments: No acute distress  HENT:     Head: Normocephalic and atraumatic.     Nose: Nose normal.  Eyes:     Conjunctiva/sclera: Conjunctivae normal.  Cardiovascular:     Rate and Rhythm: Normal rate.  Pulmonary:     Effort: Pulmonary effort is normal. No respiratory distress.     Comments: Breathing comfortably at rest, CTABL, no wheezing, rales or other adventitious sounds auscultated Abdominal:     General: There is no distension.     Comments: Lower abdomen with tenderness to palpation bilaterally and in suprapubic area, negative rebound, negative Rovsing, negative McBurney's No CVA tenderness  Musculoskeletal:        General: Normal range of motion.     Cervical back: Neck supple.     Comments: No reproducible tenderness to palpation along  length of thoracic and lumbar spine midline, no reproducible tenderness throughout palpation of left lateral and lower thoracic musculature or left flank  Skin:    General: Skin is warm and dry.  Neurological:     Mental Status: She is alert and oriented to person, place, and time.      UC Treatments / Results  Labs (all labs ordered are listed, but only abnormal results are displayed) Labs Reviewed  POCT URINALYSIS DIPSTICK, ED / UC - Abnormal; Notable for the following components:      Result Value   Hgb urine dipstick SMALL (*)    All other components within normal limits  URINE CULTURE    EKG   Radiology No results found.  Procedures Procedures (including critical care time)  Medications Ordered in UC Medications  ketorolac (TORADOL) 30 MG/ML injection 30 mg (has no administration in time range)    Initial Impression / Assessment and Plan / UC Course  I have reviewed the triage vital signs and the nursing notes.  Pertinent labs & imaging results that were available during my care of the patient were reviewed by me and considered in my medical decision making (see chart for details).     UA with negative leuks and nitrites, will send for culture to Cov[facility] rule out any signs of infection.  Does have small hemoglobin, symptoms similar to prior stone.  Treating as such with Flomax, Toradol prior to discharge, Naprosyn for pain.  Zofran for nausea.  Hydrocodone for severe pain.  Providing patient with strainer to strain urine and continue to monitor symptoms over the next few days.  Follow-up symptoms not subsiding/not passing stone or symptoms worsening.  Discussed strict return precautions. Patient verbalized understanding and is agreeable with plan.  Final Clinical Impressions(s) /  UC Diagnoses   Final diagnoses:  Left flank pain  Urinary frequency  Kidney stone     Discharge Instructions     I am treating you for a kidney stone We gave you a shot of Toradol  today to help with pain Begin Flomax daily to help pass stone Naprosyn twice daily for pain Hydrocodone for severe pain/bedtime, use sparingly, do not drive or work after taking Zofran as needed for nausea Strain urine and monitor for passing of any stone/sediment/debris If symptoms progressing or worsening please follow-up here or emergency room    ED Prescriptions    Medication Sig Dispense Auth. Provider   tamsulosin (FLOMAX) 0.4 MG CAPS capsule Take 1 capsule (0.4 mg total) by mouth daily. 12 capsule Kida Digiulio C, PA-C   naproxen (NAPROSYN) 500 MG tablet Take 1 tablet (500 mg total) by mouth 2 (two) times daily. 30 tablet Katrese Shell C, PA-C   HYDROcodone-acetaminophen (NORCO/VICODIN) 5-325 MG tablet Take 1-2 tablets by mouth every 6 (six) hours as needed. 12 tablet Aaminah Forrester C, PA-C   ondansetron (ZOFRAN ODT) 4 MG disintegrating tablet Take 1 tablet (4 mg total) by mouth every 8 (eight) hours as needed for nausea or vomiting. 20 tablet Veronica Fretz, [place] C, PA-C     I have reviewed the PDMP during this encounter.   Janith Lima, PA-C 06/04/20 1228

## 2020-06-05 LAB — URINE CULTURE: Culture: <10000 — AB

## 2020-07-10 ENCOUNTER — Other Ambulatory Visit: Payer: Self-pay | Admitting: Medical

## 2020-08-09 ENCOUNTER — Other Ambulatory Visit: Payer: Self-pay | Admitting: Medical

## 2020-09-05 ENCOUNTER — Other Ambulatory Visit: Payer: Self-pay | Admitting: Medical

## 2020-09-06 ENCOUNTER — Telehealth: Payer: Self-pay | Admitting: Medical

## 2020-09-06 ENCOUNTER — Other Ambulatory Visit: Payer: Self-pay | Admitting: Medical

## 2020-09-06 DIAGNOSIS — Z0000 Encounter for general adult medical examination without abnormal findings: Secondary | ICD-10-CM

## 2020-09-06 NOTE — Telephone Encounter (Signed)
Pt called and was requesting a refill for her amlodipine the medicine was denied in Jan and yesterday,  pt has not had a cpe since 2020. Looks like kim has tried several times to contact her to get her in a for a appt, pt states her work will not let her off to do a appt no time soon, pt stats she has been in for a appt but it was only for sick visit last year, I told her we have to seen her for wellness and medcheck. Pt was not happy that her medicine was denied, she did not see how we could deny medicine   Pt can be reached at [phone]

## 2020-09-06 NOTE — Telephone Encounter (Signed)
Let me respond to this concern  For any patient on chronic medications like blood pressure medicine, as the medicine, diabetes medicine and so forth, yes we have to see them at minimum of once per year for a physical and routine labs, and often they need to be seen more than once yearly.  Her last physical was in fact greater than 1 year ago and her screening labs, particularly with kidney function lab given her blood pressure was also greater than 1 year ago.  Her last visit was an acute visit not a screening physical in April 2021  I do not recall getting a refill request personally.  My guess is when the nurses saw the refill request and tried to contact her to go ahead and get her on the schedule, after multiple attempts of getting her on the phone without success, they eventually denied the refill at that point.    So yes we can send 30-day refill, but she needs to get on the schedule for a fasting physical and routine labs.  Is not my intention of her to go without medicine, but we put a certain amount of refills on medication to make sure we are following up on kidney function test and screenings and such.  Otherwise if we  put unlimited refills, no one would come back for the routine visits and labs  Sorry for any confusion

## 2020-09-07 NOTE — Telephone Encounter (Signed)
Left message for pt

## 2020-09-11 ENCOUNTER — Other Ambulatory Visit: Payer: Self-pay | Admitting: Medical

## 2020-09-11 ENCOUNTER — Telehealth: Payer: Self-pay | Admitting: Medical

## 2020-09-11 MED ORDER — LEVOTHYROXINE SODIUM 75 MCG PO TABS: 75.0000 ug | ORAL_TABLET | Freq: Every day | ORAL | 0 refills | Status: DC

## 2020-09-11 MED ORDER — AMLODIPINE BESYLATE 5 MG PO TABS: 5.0000 mg | ORAL_TABLET | Freq: Every day | ORAL | 0 refills | Status: DC

## 2020-09-11 MED ORDER — MONTELUKAST SODIUM 10 MG PO TABS: ORAL_TABLET | ORAL | 0 refills | Status: DC

## 2020-09-11 NOTE — Telephone Encounter (Signed)
done

## 2020-09-11 NOTE — Telephone Encounter (Signed)
Pt called and Shanes previous message was given. She did make an appt for 11/23/2020, which is Shane's next available appt. Please send refills of Amloipine and [facility]ulair  [facility] [address]

## 2020-09-29 NOTE — Telephone Encounter (Signed)
appt made, refills done

## 2020-10-05 ENCOUNTER — Other Ambulatory Visit: Payer: Self-pay | Admitting: Medical

## 2020-10-26 ENCOUNTER — Ambulatory Visit: Payer: Commercial Managed Care - PPO

## 2020-10-30 DIAGNOSIS — E042 Nontoxic multinodular goiter: Secondary | ICD-10-CM | POA: Insufficient documentation

## 2020-10-30 DIAGNOSIS — H6121 Impacted cerumen, right ear: Secondary | ICD-10-CM | POA: Insufficient documentation

## 2020-10-30 DIAGNOSIS — M26629 Arthralgia of temporomandibular joint, unspecified side: Secondary | ICD-10-CM | POA: Insufficient documentation

## 2020-11-04 ENCOUNTER — Other Ambulatory Visit: Payer: Self-pay | Admitting: Medical

## 2020-11-23 ENCOUNTER — Ambulatory Visit (INDEPENDENT_AMBULATORY_CARE_PROVIDER_SITE_OTHER): Payer: Commercial Managed Care - PPO | Admitting: Medical

## 2020-11-23 ENCOUNTER — Other Ambulatory Visit: Payer: Self-pay

## 2020-11-23 ENCOUNTER — Encounter: Payer: Self-pay | Admitting: Medical

## 2020-11-23 VITALS — BP 134/86 | HR 79 | Ht 65.0 in | Wt 259.0 lb

## 2020-11-23 DIAGNOSIS — Z0000 Encounter for general adult medical examination without abnormal findings: Secondary | ICD-10-CM

## 2020-11-23 DIAGNOSIS — Z1211 Encounter for screening for malignant neoplasm of colon: Secondary | ICD-10-CM | POA: Insufficient documentation

## 2020-11-23 DIAGNOSIS — K219 Gastro-esophageal reflux disease without esophagitis: Secondary | ICD-10-CM

## 2020-11-23 DIAGNOSIS — B001 Herpesviral vesicular dermatitis: Secondary | ICD-10-CM

## 2020-11-23 DIAGNOSIS — Z87442 Personal history of urinary calculi: Secondary | ICD-10-CM

## 2020-11-23 DIAGNOSIS — J4540 Moderate persistent asthma, uncomplicated: Secondary | ICD-10-CM

## 2020-11-23 DIAGNOSIS — Z889 Allergy status to unspecified drugs, medicaments and biological substances status: Secondary | ICD-10-CM | POA: Diagnosis not present

## 2020-11-23 DIAGNOSIS — E039 Hypothyroidism, unspecified: Secondary | ICD-10-CM

## 2020-11-23 DIAGNOSIS — Z113 Encounter for screening for infections with a predominantly sexual mode of transmission: Secondary | ICD-10-CM | POA: Insufficient documentation

## 2020-11-23 DIAGNOSIS — E049 Nontoxic goiter, unspecified: Secondary | ICD-10-CM

## 2020-11-23 DIAGNOSIS — I10 Essential (primary) hypertension: Secondary | ICD-10-CM

## 2020-11-23 DIAGNOSIS — L83 Acanthosis nigricans: Secondary | ICD-10-CM

## 2020-11-23 DIAGNOSIS — Z13220 Encounter for screening for lipoid disorders: Secondary | ICD-10-CM | POA: Insufficient documentation

## 2020-11-23 DIAGNOSIS — Z1231 Encounter for screening mammogram for malignant neoplasm of breast: Secondary | ICD-10-CM | POA: Insufficient documentation

## 2020-11-23 DIAGNOSIS — L659 Nonscarring hair loss, unspecified: Secondary | ICD-10-CM

## 2020-11-23 NOTE — Progress Notes (Signed)
Subjective:   HPI  Rebecca Soto is a 50 y.o. female who presents for Chief Complaint  Patient presents with  . Annual Exam    Patient Care Team: Rolly Magri, Leward Quan as PCP - General (Family Medicine) Sees dentist Sees eye doctor [facility]  Concerns: She notes some hair thinning overall in the past several months.  No discrete patches of hair loss.  No pulling at the hair  Hypothyroidism-compliant with medication however she takes the medicine at bedtime along with her blood pressure medication  She had a sleep study about a year ago but says she never fully went to sleep so not sure how that study could have been accurate.  She does wake rested though  Doing fine with current asthma medication without complaint.  She does use the Symbicort every day twice daily  Sexually active, 1 partner, they both had HIV testing a year ago.  She would like routine STD testing other than HIV  Reviewed their medical, surgical, family, social, medication, and allergy history and updated chart as appropriate.  Past Medical History:  Diagnosis Date  . Allergy   . Asthma   . Goiter   . Herpes labialis 08/22/2017  . Hypertension 2019    Family History  Problem Relation Age of Onset  . Cancer Other   . Hypertension Other   . Hyperlipidemia Other   . Hypertension Mother   . Heart disease Mother        cardiomegaly  . Other Mother        brain tumor  . Aneurysm Mother        brain  . Hypertension Father   . Cancer Father        throat  . Hypertension Brother   . Diabetes Brother   . Diabetes Maternal Grandmother   . Hypertension Maternal Grandmother   . Hypertension Maternal Grandfather   . Hypertension Sister   . Hypertension Paternal Grandmother   . Hypertension Paternal Grandfather   . Hypertension Sister   . Diabetes Brother   . Hypertension Brother   . Diabetes Brother   . Hypertension Brother   . Diabetes Brother   . Hypertension Brother   .  Diabetes Brother   . Hypertension Brother   . Diabetes Brother   . Hypertension Brother   . Hypertension Brother      Current Outpatient Medications:  .  albuterol (PROVENTIL HFA;VENTOLIN HFA) 108 (90 Base) MCG/ACT inhaler, Inhale 2 puffs into the lungs every 6 (six) hours as needed for wheezing or shortness of breath., Disp: 1 Inhaler, Rfl: 1 .  amLODipine (NORVASC) 5 MG tablet, TAKE 1 TABLET (5 MG TOTAL) BY MOUTH DAILY., Disp: 30 tablet, Rfl: 0 .  levothyroxine (SYNTHROID) 75 MCG tablet, TAKE 1 TABLET BY MOUTH EVERY DAY BEFORE BREAKFAST, Disp: 30 tablet, Rfl: 0 .  montelukast (SINGULAIR) 10 MG tablet, TAKE 1 TABLET BY MOUTH EVERYDAY AT BEDTIME, Disp: 90 tablet, Rfl: 0 .  SYMBICORT 160-4.5 MCG/ACT inhaler, TAKE 2 PUFFS BY MOUTH TWICE A DAY, Disp: 30.6 each, Rfl: 1 .  valACYclovir (VALTREX) 1000 MG tablet, Take 2,000 mg by mouth q12h x 1 day. Start ASAP at onset of symptoms., Disp: 30 tablet, Rfl: 2 .  triamcinolone cream (KENALOG) 0.1 %, Apply 1 application topically 2 (two) times daily. (Patient not taking: No sig reported), Disp: 30 g, Rfl: 0  Allergies  Allergen Reactions  . Other Anaphylaxis and Hives    Artichokes IV contrast/dye  .  Iohexol Hives  . Aspirin Other (See Comments)    Dry throat and coughing   . Chlorpheniramine Itching  . Diphenhydramine Hcl Other (See Comments)    Unknown  . Latex       Review of Systems Constitutional: -fever, -chills, -sweats, -unexpected weight change, -decreased appetite, -fatigue Allergy: -sneezing, -itching, -congestion Dermatology: -changing moles, --rash, -lumps ENT: -runny nose, -ear pain, -sore throat, -hoarseness, -sinus pain, -teeth pain, - ringing in ears, -hearing loss, -nosebleeds Cardiology: -chest pain, -palpitations, -swelling, -difficulty breathing when lying flat, -waking up short of breath Respiratory: -cough, -shortness of breath, -difficulty breathing with exercise or exertion, -wheezing, -coughing up  blood Gastroenterology: -abdominal pain, -nausea, -vomiting, -diarrhea, -constipation, -blood in stool, -changes in bowel movement, -difficulty swallowing or eating Hematology: -bleeding, -bruising  Musculoskeletal: -joint aches, -muscle aches, -joint swelling, -back pain, -neck pain, -cramping, -changes in gait Ophthalmology: denies vision changes, eye redness, itching, discharge Urology: -burning with urination, -difficulty urinating, -blood in urine, -urinary frequency, -urgency, -incontinence Neurology: -headache, -weakness, -tingling, -numbness, -memory loss, -falls, -dizziness Psychology: -depressed mood, -agitation, -sleep problems Breast/gyn: -breast tendnerss, -discharge, -lumps, -vaginal discharge,- irregular periods, -heavy periods     Objective:  BP 134/86   Pulse 79   Ht 5\' 5"  (1.651 m)   Wt 259 lb (117.5 kg)   SpO2 97%   BMI 43.10 kg/m   General appearance: alert, no distress, WD/WN, African American female Skin: No worrisome skin lesions, no obvious patches of hair loss HEENT: normocephalic, conjunctiva/corneas normal, sclerae anicteric, PERRLA, EOMi, nares patent, no discharge or erythema, pharynx normal Oral cavity: MMM, tongue normal, teeth normal Neck: supple, no lymphadenopathy, no thyromegaly, no masses, normal ROM, no bruits Chest: non tender, normal shape and expansion Heart: RRR, normal S1, S2, no murmurs Lungs: CTA bilaterally, no wheezes, rhonchi, or rales Abdomen: +bs, soft, non tender, non distended, no masses, no hepatomegaly, no splenomegaly, no bruits Back: non tender, normal ROM, no scoliosis Musculoskeletal: upper extremities non tender, no obvious deformity, normal ROM throughout, lower extremities non tender, no obvious deformity, normal ROM throughout Extremities: no edema, no cyanosis, no clubbing Pulses: 2+ symmetric, upper and lower extremities, normal cap refill Neurological: alert, oriented x 3, CN2-12 intact, strength normal upper extremities  and lower extremities, sensation normal throughout, DTRs 2+ throughout, no cerebellar signs, gait normal Psychiatric: normal affect, behavior normal, pleasant  Breast/gyn/rectal - deferred to gynecology     Assessment and Plan :   Encounter Diagnoses  Name Primary?  . Routine general medical examination at a health care facility Yes  . Colon cancer screening   . Hypothyroidism, unspecified type   . Hx of seasonal allergies   . History of renal stone   . Herpes labialis   . Moderate persistent asthma without complication   . Acanthosis nigricans   . Essential hypertension, benign   . Gastroesophageal reflux disease, unspecified whether esophagitis present   . Goiter   . Screen for STD (sexually transmitted disease)   . Screen for colon cancer   . Encounter for screening mammogram for malignant neoplasm of breast   . Screening for lipid disorders   . Hair loss     Today you had a preventative care visit or wellness visit.    Topics today may have included healthy lifestyle, diet, exercise, preventative care, vaccinations, sick and well care, proper use of emergency dept and after hours care, as well as other concerns.     Recommendations: Continue to return yearly for your annual wellness and preventative  care visits.  This gives [place] a chance to discuss healthy lifestyle, exercise, vaccinations, review your chart record, and perform screenings where appropriate.  I recommend you see your eye doctor yearly for routine vision care.  I recommend you see your dentist yearly for routine dental care including hygiene visits twice yearly.  See your gynecologist yearly for routine follow-up  Vaccination recommendations were reviewed  Shingles vaccine:  I recommend you have a shingles vaccine to help prevent shingles or herpes zoster outbreak.   Please call your insurer to inquire about coverage for the Shingrix vaccine given in 2 doses.   Some insurers cover this vaccine after age 51,  some cover this after age 16.  If your insurer covers this, then call to schedule appointment to have this vaccine here.  Vaccines up-to-date otherwise  Screening for cancer: Breast cancer screening: You should perform a self breast exam monthly.   We reviewed recommendations for regular mammograms and breast cancer screening.  Colon cancer screening:  We will refer you for screening colonoscopy  She is status post hysterectomy  Skin cancer screening: Check your skin regularly for new changes, growing lesions, or other lesions of concern Come in for evaluation if you have skin lesions of concern.  Lung cancer screening: If you have a greater than 30 pack year history of tobacco use, then you qualify for lung cancer screening with a chest CT scan  We currently don't have screenings for other cancers besides breast, cervical, colon, and lung cancers.  If you have a strong family history of cancer or have other cancer screening concerns, please let me know.    Bone health: Get at least 150 minutes of aerobic exercise weekly Get weight bearing exercise at least once weekly   Heart health: Get at least 150 minutes of aerobic exercise weekly Limit alcohol It is important to maintain a healthy blood pressure and healthy cholesterol numbers    Screening for sexually transmitted infections: We discussed testing, prevention, and means of transmission    Separate significant issues discussed: Obesity-needs to work on efforts to lose weight through healthy diet and exercise  Hypothyroidism-discussed proper use of medication and advise she change to morning dosing 45 minutes before breakfast or other medications  Hypertension-also advise she take her blood pressure medicine in the morning after the thyroid medicine dosing  Likely needs to be on statin given family history.  Recheck labs today  Hair loss-discussed possible causes which could include menopausal changes, vitamin  deficiency, thyroid, hormonal, and she does have a long extensions which could cause some pulling on the hair  GERD-no recent concerns  Asthma-controlled on current therapy  Allergies - continue current medication   Shamell was seen today for annual exam.  Diagnoses and all orders for this visit:  Routine general medical examination at a health care facility -     Comprehensive metabolic panel -     CBC -     Lipid panel -     TSH -     T4, free -     RPR -     Hepatitis C antibody -     GC/Chlamydia Probe Amp -     VITAMIN D 25 Hydroxy (Vit-D Deficiency, Fractures)  Colon cancer screening -     Ambulatory referral to Gastroenterology  Hypothyroidism, unspecified type -     TSH -     T4, free  Hx of seasonal allergies  History of renal stone  Herpes labialis  Moderate persistent asthma without complication  Acanthosis nigricans  Essential hypertension, benign  Gastroesophageal reflux disease, unspecified whether esophagitis present  Goiter  Screen for STD (sexually transmitted disease) -     RPR -     Hepatitis C antibody -     GC/Chlamydia Probe Amp  Screen for colon cancer  Encounter for screening mammogram for malignant neoplasm of breast  Screening for lipid disorders -     Lipid panel  Hair loss -     VITAMIN D 25 Hydroxy (Vit-D Deficiency, Fractures)     Follow-up pending labs, yearly for physical

## 2020-11-24 ENCOUNTER — Other Ambulatory Visit: Payer: Self-pay | Admitting: Medical

## 2020-11-24 ENCOUNTER — Ambulatory Visit: Payer: Commercial Managed Care - PPO

## 2020-11-24 DIAGNOSIS — B001 Herpesviral vesicular dermatitis: Secondary | ICD-10-CM

## 2020-11-24 LAB — COMPREHENSIVE METABOLIC PANEL
ALT: 27 IU/L (ref 0–32)
AST: 31 IU/L (ref 0–40)
Albumin/Globulin Ratio: 1.7 (ref 1.2–2.2)
Albumin: 4.4 g/dL (ref 3.8–4.8)
Alkaline Phosphatase: 91 IU/L (ref 44–121)
BUN/Creatinine Ratio: 7 — ABNORMAL LOW (ref 9–23)
BUN: 6 mg/dL (ref 6–24)
Bilirubin Total: 0.3 mg/dL (ref 0.0–1.2)
CO2: 22 mmol/L (ref 20–29)
Calcium: 9.1 mg/dL (ref 8.7–10.2)
Chloride: 105 mmol/L (ref 96–106)
Creatinine, Ser: 0.82 mg/dL (ref 0.57–1.00)
Globulin, Total: 2.6 g/dL (ref 1.5–4.5)
Glucose: 85 mg/dL (ref 65–99)
Potassium: 3.9 mmol/L (ref 3.5–5.2)
Sodium: 142 mmol/L (ref 134–144)
Total Protein: 7 g/dL (ref 6.0–8.5)
eGFR: 87 mL/min/{1.73_m2} (ref >59)

## 2020-11-24 LAB — LIPID PANEL
Chol/HDL Ratio: 2.7 ratio (ref 0.0–4.4)
Cholesterol, Total: 199 mg/dL (ref 100–199)
HDL: 73 mg/dL (ref >39)
LDL Chol Calc (NIH): 115 mg/dL — ABNORMAL HIGH (ref 0–99)
Triglycerides: 57 mg/dL (ref 0–149)
VLDL Cholesterol Cal: 11 mg/dL (ref 5–40)

## 2020-11-24 LAB — CBC
Hematocrit: 40.8 % (ref 34.0–46.6)
Hemoglobin: 12.9 g/dL (ref 11.1–15.9)
MCH: 28.7 pg (ref 26.6–33.0)
MCHC: 31.6 g/dL (ref 31.5–35.7)
MCV: 91 fL (ref 79–97)
Platelets: 353 10*3/uL (ref 150–450)
RBC: 4.49 x10E6/uL (ref 3.77–5.28)
RDW: 12.8 % (ref 11.7–15.4)
WBC: 4.3 10*3/uL (ref 3.4–10.8)

## 2020-11-24 LAB — HEPATITIS C ANTIBODY: Hep C Virus Ab: <0.1 s/co ratio (ref 0.0–0.9)

## 2020-11-24 LAB — RPR: RPR Ser Ql: NONREACTIVE

## 2020-11-24 LAB — T4, FREE: Free T4: 1.25 ng/dL (ref 0.82–1.77)

## 2020-11-24 LAB — TSH: TSH: 0.352 u[IU]/mL — ABNORMAL LOW (ref 0.450–4.500)

## 2020-11-24 LAB — VITAMIN D 25 HYDROXY (VIT D DEFICIENCY, FRACTURES): Vit D, 25-Hydroxy: 16.8 ng/mL — ABNORMAL LOW (ref 30.0–100.0)

## 2020-11-24 MED ORDER — BUDESONIDE-FORMOTEROL FUMARATE 160-4.5 MCG/ACT IN AERO: INHALATION_SPRAY | RESPIRATORY_TRACT | 3 refills | Status: DC

## 2020-11-24 MED ORDER — ALBUTEROL SULFATE HFA 108 (90 BASE) MCG/ACT IN AERS: 2.0000 | INHALATION_SPRAY | Freq: Four times a day (QID) | RESPIRATORY_TRACT | 1 refills | Status: DC | PRN

## 2020-11-24 MED ORDER — AMLODIPINE BESYLATE 5 MG PO TABS: 5.0000 mg | ORAL_TABLET | Freq: Every day | ORAL | 3 refills | Status: DC

## 2020-11-24 MED ORDER — LEVOTHYROXINE SODIUM 50 MCG PO TABS: 50.0000 ug | ORAL_TABLET | Freq: Every day | ORAL | 11 refills | Status: DC

## 2020-11-24 MED ORDER — VITAMIN D 25 MCG (1000 UNIT) PO TABS: 1000.0000 [IU] | ORAL_TABLET | Freq: Every day | ORAL | 3 refills | Status: DC

## 2020-11-24 MED ORDER — VALACYCLOVIR HCL 1 G PO TABS: ORAL_TABLET | ORAL | 2 refills | Status: DC

## 2020-11-24 MED ORDER — ROSUVASTATIN CALCIUM 10 MG PO TABS: 10.0000 mg | ORAL_TABLET | Freq: Every day | ORAL | 3 refills | Status: DC

## 2020-11-25 LAB — GC/CHLAMYDIA PROBE AMP
Chlamydia trachomatis, NAA: NEGATIVE
Neisseria Gonorrhoeae by PCR: NEGATIVE

## 2020-11-27 ENCOUNTER — Other Ambulatory Visit: Payer: Self-pay | Admitting: Medical

## 2020-11-30 ENCOUNTER — Other Ambulatory Visit: Payer: Self-pay | Admitting: Medical

## 2020-12-09 ENCOUNTER — Other Ambulatory Visit: Payer: Self-pay | Admitting: Medical

## 2021-01-11 ENCOUNTER — Other Ambulatory Visit: Payer: Self-pay | Admitting: Medical

## 2021-01-17 ENCOUNTER — Other Ambulatory Visit: Payer: Self-pay

## 2021-01-17 ENCOUNTER — Ambulatory Visit
Admission: RE | Admit: 2021-01-17 | Discharge: 2021-01-17 | Disposition: A | Discharge status: Home / Self Care | Payer: Commercial Managed Care - PPO | Source: Ambulatory Visit | Attending: Medical | Admitting: Medical

## 2021-01-17 DIAGNOSIS — Z0000 Encounter for general adult medical examination without abnormal findings: Secondary | ICD-10-CM

## 2021-01-26 ENCOUNTER — Encounter: Payer: Self-pay | Admitting: Medical

## 2021-01-26 ENCOUNTER — Other Ambulatory Visit: Payer: Self-pay

## 2021-01-26 ENCOUNTER — Telehealth: Payer: Commercial Managed Care - PPO | Admitting: Medical

## 2021-01-26 VITALS — BP 119/59 | HR 89 | Wt 242.0 lb

## 2021-01-26 DIAGNOSIS — J4540 Moderate persistent asthma, uncomplicated: Secondary | ICD-10-CM | POA: Diagnosis not present

## 2021-01-26 DIAGNOSIS — J988 Other specified respiratory disorders: Secondary | ICD-10-CM

## 2021-01-26 DIAGNOSIS — R059 Cough, unspecified: Secondary | ICD-10-CM

## 2021-01-26 MED ORDER — AZITHROMYCIN 250 MG PO TABS: ORAL_TABLET | ORAL | 0 refills | Status: DC

## 2021-01-26 NOTE — Progress Notes (Signed)
Subjective:     Patient ID: Rebecca Soto, female   DOB: 1971-04-07, 50 y.o.   MRN: [id number]  This visit type was conducted due to national recommendations for restrictions regarding the COVID-19 Pandemic (e.g. social distancing) in an effort to limit this patient's exposure and mitigate transmission in our community.  Due to their co-morbid illnesses, this patient is at least at moderate risk for complications without adequate follow up.  This format is felt to be most appropriate for this patient at this time.    Documentation for virtual audio and video telecommunications through [place] encounter:  The patient was located at home. The provider was located in the office. The patient did consent to this visit and is aware of possible charges through their insurance for this visit.  The other persons participating in this telemedicine service were none. Time spent on call was 20 minutes and in review of previous records 20 minutes total.  This virtual service is not related to other E/M service within previous 7 days.   HPI Chief Complaint  Patient presents with   sick    Symptoms- congestion in chest, cough, blood and yellow mucous. Symptoms started 3 days ago. Just got back from the beach. Took covid test and negative   Visit for 3-day history of respiratory tract infection.  She was at the beach and just got back from vacation.  She was swimming but did not submerge her head so she did not have any water flush up her nose or into her lungs.  She notes chest congestion, cough, yellow phlegm with some blood-tinged mucus on several occasions.  Possible drainage down the back of her throat but mostly feels congestion in the chest.  Had a little bit of an itchy throat.  Not painful throat.  No fever, no NVD.  No change in smell or taste.   No ear or sinus pressure.  She has a history of ashtma, has some worse mild wheezing.  Using inhaler more.   Water intake is good.  No  sick contacts.  She did do a COVID test yesterday that was negative.  Past Medical History:  Diagnosis Date   Allergy    Asthma    Goiter    Herpes labialis 08/22/2017   Hypertension 2019   Current Outpatient Medications on File Prior to Visit  Medication Sig Dispense Refill   albuterol (VENTOLIN HFA) 108 (90 Base) MCG/ACT inhaler TAKE 2 PUFFS BY MOUTH EVERY 6 HOURS AS NEEDED FOR WHEEZE OR SHORTNESS OF BREATH 6.7 each 1   amLODipine (NORVASC) 5 MG tablet Take 1 tablet (5 mg total) by mouth daily. 90 tablet 3   budesonide-formoterol (SYMBICORT) 160-4.5 MCG/ACT inhaler TAKE 2 PUFFS BY MOUTH TWICE A DAY 30.6 each 3   cholecalciferol (VITAMIN D3) 25 MCG (1000 UNIT) tablet Take 1 tablet (1,000 Units total) by mouth daily. 90 tablet 3   levothyroxine (SYNTHROID) 50 MCG tablet Take 1 tablet (50 mcg total) by mouth daily. 30 tablet 11   montelukast (SINGULAIR) 10 MG tablet TAKE 1 TABLET BY MOUTH EVERYDAY AT BEDTIME 90 tablet 0   rosuvastatin (CRESTOR) 10 MG tablet Take 1 tablet (10 mg total) by mouth daily. 90 tablet 3   triamcinolone cream (KENALOG) 0.1 % Apply 1 application topically 2 (two) times daily. 30 g 0   valACYclovir (VALTREX) 1000 MG tablet Take 2,000 mg by mouth q12h x 1 day. Start ASAP at onset of symptoms. 30 tablet 2   No current facility-administered  medications on file prior to visit.    Review of Systems As in subjective    Objective:   Physical Exam Due to coronavirus pandemic stay at home measures, patient visit was virtual and they were not examined in person.   BP (!) 119/59   Pulse 89   Wt 242 lb (109.8 kg)   BMI 40.27 kg/m   General no acute distress No labored breathing, no wheezing answers questions in complete sentences     Assessment:     Encounter Diagnoses  Name Primary?   Cough Yes   Respiratory tract infection    Moderate persistent asthma without complication         Plan:     We discussed symptoms and concerns.  I put in a chest x-ray  order given the blood-tinged sputum.  We discussed the limitations of virtual consult.  Advised rest, hydration, begin Mucinex DM over-the-counter, continue inhalers as needed.  We discussed adding Z-Pak if symptoms persist over the next 48 hours particularly blood-tinged sputum.  We discussed getting an x-ray as well  If much worse over the weekend get reevaluated.  Otherwise recheck if not improving or recheck pending x-ray  [name] was seen today for sick.  Diagnoses and all orders for this visit:  Cough -     DG Chest 2 View; Future  Respiratory tract infection  Moderate persistent asthma without complication  Other orders -     azithromycin (ZITHROMAX) 250 MG tablet; 2 tablets day 1, then 1 tablet days 2-4  F/u prn

## 2021-01-29 ENCOUNTER — Ambulatory Visit (HOSPITAL_COMMUNITY)
Admission: RE | Admit: 2021-01-29 | Discharge: 2021-01-29 | Disposition: A | Discharge status: Home / Self Care | Payer: Commercial Managed Care - PPO | Source: Ambulatory Visit | Attending: Medical | Admitting: Medical

## 2021-01-29 ENCOUNTER — Other Ambulatory Visit: Payer: Self-pay

## 2021-01-29 DIAGNOSIS — R059 Cough, unspecified: Secondary | ICD-10-CM | POA: Diagnosis present

## 2021-01-30 NOTE — Progress Notes (Signed)
LVM for pt to call back for lab results [facility]

## 2021-02-01 ENCOUNTER — Telehealth: Payer: Self-pay | Admitting: Medical

## 2021-02-01 ENCOUNTER — Other Ambulatory Visit: Payer: Self-pay | Admitting: Medical

## 2021-02-01 NOTE — Telephone Encounter (Signed)
Pt called and states that she has a virtual appt with Audelia Acton and at that time was negative for COVID. She tested positive this morning. She states that she has asthma and is coughing with alittle bit of blood. Please advise pt at [phone]. pt uses [facility].

## 2021-02-02 NOTE — Telephone Encounter (Signed)
Pt started having cough and congestion last Monday. Tested negative up until yesterday. Yesterday symptoms, coughing up blood, congestion worse, no taste and sore throat. She feels worse than she did when talking to you. Son tested positive as well. Please advise

## 2021-02-05 NOTE — Telephone Encounter (Signed)
The last couple days were rough, fever, chills, spitting up blood in mucous.. within the last 24 has been better and only has a cough now.

## 2021-02-06 NOTE — Telephone Encounter (Signed)
Left message for pt to call me back 

## 2021-02-07 ENCOUNTER — Encounter: Payer: Self-pay | Admitting: Internal Medicine

## 2021-02-07 ENCOUNTER — Other Ambulatory Visit: Payer: Self-pay | Admitting: Medical

## 2021-02-07 MED ORDER — BENZONATATE 200 MG PO CAPS: 200.0000 mg | ORAL_CAPSULE | Freq: Three times a day (TID) | ORAL | 0 refills | Status: DC | PRN

## 2021-02-07 NOTE — Telephone Encounter (Signed)
Pt was notified of med and letter

## 2021-02-07 NOTE — Telephone Encounter (Signed)
Did you send in cough medication

## 2021-02-07 NOTE — Telephone Encounter (Signed)
Pt still has a cough and would like something for cough as laying down makes it worse. Her boss made her come back to work yesterday and she did too much and is very fatigue today, so she called out as she is exahusted. She would like to know if you can give her a note for work today and tomorrow to rest. She did test negative today.,

## 2021-02-16 ENCOUNTER — Other Ambulatory Visit: Payer: Self-pay | Admitting: Internal Medicine

## 2021-02-16 DIAGNOSIS — E042 Nontoxic multinodular goiter: Secondary | ICD-10-CM

## 2021-02-26 ENCOUNTER — Ambulatory Visit
Admission: RE | Admit: 2021-02-26 | Discharge: 2021-02-26 | Disposition: A | Discharge status: Home / Self Care | Payer: Commercial Managed Care - PPO | Source: Ambulatory Visit | Attending: Internal Medicine | Admitting: Internal Medicine

## 2021-02-26 ENCOUNTER — Other Ambulatory Visit: Payer: Self-pay

## 2021-02-26 DIAGNOSIS — E042 Nontoxic multinodular goiter: Secondary | ICD-10-CM

## 2021-02-28 ENCOUNTER — Encounter: Payer: Self-pay | Admitting: Physician Assistant

## 2021-03-01 ENCOUNTER — Other Ambulatory Visit: Payer: Self-pay | Admitting: Internal Medicine

## 2021-03-01 DIAGNOSIS — E042 Nontoxic multinodular goiter: Secondary | ICD-10-CM

## 2021-03-19 ENCOUNTER — Other Ambulatory Visit: Payer: Self-pay | Admitting: Medical

## 2021-03-20 ENCOUNTER — Other Ambulatory Visit (HOSPITAL_COMMUNITY)
Admission: RE | Admit: 2021-03-20 | Discharge: 2021-03-20 | Disposition: A | Discharge status: Home / Self Care | Payer: Commercial Managed Care - PPO | Source: Ambulatory Visit | Attending: Internal Medicine | Admitting: Internal Medicine

## 2021-03-20 ENCOUNTER — Ambulatory Visit
Admission: RE | Admit: 2021-03-20 | Discharge: 2021-03-20 | Disposition: A | Discharge status: Home / Self Care | Payer: Commercial Managed Care - PPO | Source: Ambulatory Visit | Attending: Internal Medicine | Admitting: Internal Medicine

## 2021-03-20 ENCOUNTER — Other Ambulatory Visit: Payer: Self-pay

## 2021-03-20 DIAGNOSIS — D34 Benign neoplasm of thyroid gland: Secondary | ICD-10-CM | POA: Diagnosis not present

## 2021-03-20 DIAGNOSIS — E042 Nontoxic multinodular goiter: Secondary | ICD-10-CM

## 2021-03-20 DIAGNOSIS — E041 Nontoxic single thyroid nodule: Secondary | ICD-10-CM | POA: Diagnosis present

## 2021-03-22 LAB — HM MAMMOGRAPHY

## 2021-03-22 LAB — CYTOLOGY - NON PAP

## 2021-03-27 ENCOUNTER — Other Ambulatory Visit: Payer: Self-pay | Admitting: Medical

## 2021-03-28 ENCOUNTER — Encounter: Payer: Self-pay | Admitting: Physician Assistant

## 2021-03-28 ENCOUNTER — Ambulatory Visit: Payer: Commercial Managed Care - PPO | Admitting: Physician Assistant

## 2021-03-28 VITALS — BP 134/80 | HR 80 | Ht 65.0 in | Wt 252.6 lb

## 2021-03-28 DIAGNOSIS — Z1211 Encounter for screening for malignant neoplasm of colon: Secondary | ICD-10-CM

## 2021-03-28 DIAGNOSIS — R140 Abdominal distension (gaseous): Secondary | ICD-10-CM | POA: Diagnosis not present

## 2021-03-28 MED ORDER — NA SULFATE-K SULFATE-MG SULF 17.5-3.13-1.6 GM/177ML PO SOLN: 1.0000 | Freq: Once | ORAL | 0 refills | Status: AC

## 2021-03-28 NOTE — Progress Notes (Addendum)
Subjective:    Patient ID: Rebecca Soto, female    DOB: August 20, 1970, 50 y.o.   MRN: [id number]  HPI Rebecca Soto is a pleasant 50 year old female, new to GI today, referred by Rebecca Bode, PA-C for colon cancer screening.  She also has complaints of chronic bloating and gas. Patient has not had any prior GI evaluation.  Family history is negative for colon cancer/polyps as far she is aware. Patient has history of hypertension, asthma, hypothyroidism, mild sleep apnea (no CPAP or oxygen) and is status post hysterectomy.  She has had some mild issues with constipation, has used mag citrate in the past if severe and currently using MiraLAX as needed.  She has had chronic problems with bloating and gassiness which she says has been present for years.  No melena or hematochezia.  No complaints of abdominal pain.  Appetite has been fine.  She is currently on Ozempic for weight loss which has been effective and she is tolerating without difficulty.  She does get occasional heartburn and perhaps once a month an issue with sour brash.  No daily symptoms, no dysphagia or odynophagia. She says that she could not tolerate milk products as a child, but has not really considered to being lactose intolerant at present.  She does consume artificial sweeteners in the form of diet sodas.  Not aware of any particular food intolerances.  Review of Systems Pertinent positive and negative review of systems were noted in the above HPI section.  All other review of systems was otherwise negative.   Outpatient Encounter Medications as of 03/28/2021  Medication Sig   albuterol (VENTOLIN HFA) 108 (90 Base) MCG/ACT inhaler TAKE 2 PUFFS BY MOUTH EVERY 6 HOURS AS NEEDED FOR WHEEZE OR SHORTNESS OF BREATH   amLODipine (NORVASC) 5 MG tablet Take 1 tablet (5 mg total) by mouth daily.   budesonide-formoterol (SYMBICORT) 160-4.5 MCG/ACT inhaler TAKE 2 PUFFS BY MOUTH TWICE A DAY   cholecalciferol (VITAMIN D3) 25 MCG (1000 UNIT)  tablet Take 1 tablet (1,000 Units total) by mouth daily.   levothyroxine (SYNTHROID) 50 MCG tablet Take 1 tablet (50 mcg total) by mouth daily.   montelukast (SINGULAIR) 10 MG tablet TAKE 1 TABLET BY MOUTH EVERYDAY AT BEDTIME   Na Sulfate-K Sulfate-Mg Sulf 17.5-3.13-1.6 GM/177ML SOLN Take 1 kit by mouth once for 1 dose.   rosuvastatin (CRESTOR) 10 MG tablet Take 1 tablet (10 mg total) by mouth daily.   Semaglutide,0.25 or 0.5MG/DOS, (OZEMPIC, 0.25 OR 0.5 MG/DOSE,) 2 MG/1.5ML SOPN Inject into the skin.   valACYclovir (VALTREX) 1000 MG tablet Take 2,000 mg by mouth q12h x 1 day. Start ASAP at onset of symptoms.   [DISCONTINUED] azithromycin (ZITHROMAX) 250 MG tablet 2 tablets day 1, then 1 tablet days 2-4   [DISCONTINUED] benzonatate (TESSALON) 200 MG capsule Take 1 capsule (200 mg total) by mouth 3 (three) times daily as needed for cough.   [DISCONTINUED] triamcinolone cream (KENALOG) 0.1 % Apply 1 application topically 2 (two) times daily.   No facility-administered encounter medications on file as of 03/28/2021.   Allergies  Allergen Reactions   Other Anaphylaxis and Hives    Artichokes IV contrast/dye   Iohexol Hives   Aspirin Other (See Comments)    Dry throat and coughing    Chlorpheniramine Itching   Diphenhydramine Hcl Other (See Comments)    Unknown   Latex    Patient Active Problem List   Diagnosis Date Noted   Screen for STD (sexually transmitted disease) 11/23/2020  Screen for colon cancer 11/23/2020   Encounter for screening mammogram for malignant neoplasm of breast 11/23/2020   Screening for lipid disorders 11/23/2020   Hair loss 11/23/2020   Daytime somnolence 10/29/2019   Fatigue 10/29/2019   Arthralgia of hand 10/29/2019   Elbow pain 10/29/2019   Ulnar nerve compression, 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   Acanthosis nigricans 05/31/2019   Blood in urine 05/31/2019   Candidal vulvovaginitis 05/31/2019   Constipation 05/31/2019   Dysuria 03/19/2019   Hx of seasonal allergies  10/15/2018   Nocturia 10/15/2018   Gastroesophageal reflux disease 10/15/2018   Moderate persistent asthma without complication 46/56/8127   Routine general medical examination at a health care facility 08/14/2018   Nonintractable headache 08/14/2018   Abnormal mammogram 08/14/2018   Goiter 08/14/2018   Renal cyst 08/14/2018   Essential hypertension, benign 07/23/2018   Family history of cancer 04/17/2018   Herpes labialis 08/22/2017   Low back pain 06/07/2016   Frequent urination 06/07/2016   History of renal stone 06/07/2016   Hypothyroidism 06/07/2016   Social History   Socioeconomic History   Marital status: Single    Spouse name: Not on file   Number of children: Not on file   Years of education: Not on file   Highest education level: Not on file  Occupational History   Not on file  Tobacco Use   Smoking status: Never   Smokeless tobacco: Never  Vaping Use   Vaping Use: Never used  Substance and Sexual Activity   Alcohol use: Yes    Alcohol/week: 2.0 standard drinks    Types: 2 Glasses of wine per week   Drug use: No   Sexual activity: Yes    Birth control/protection: Surgical  Other Topics Concern   Not on file  Social History Narrative   Lives with mom and son.  Has 1 boyfriend times 1 year.  Works as Marine scientist, [employer]  Exercise - walking.  11/2020   Social Determinants of Health   Financial Resource Strain: Not on file  Food Insecurity: Not on file  Transportation Needs: Not on file  Physical Activity: Not on file  Stress: Not on file  Social Connections: Not on file  Intimate Partner Violence: Not on file    Rebecca Soto's family history includes Aneurysm in her mother; Cancer in her father and another family member; Diabetes in her brother, brother, brother, brother, brother, brother, and maternal grandmother; Heart disease in her mother; Hyperlipidemia in an other family member; Hypertension in her brother, brother, brother, brother,  brother, brother, brother, father, maternal grandfather, maternal grandmother, mother, paternal grandfather, paternal grandmother, sister, sister, and another family member; Other in her mother.      Objective:    Vitals:   03/28/21 1505  BP: 134/80  Pulse: 80    Physical Exam Well-developed well-nourished African-American female in no acute distress.  Pleasant  Weight, 252 BMI 42.0  HEENT; nontraumatic normocephalic, EOMI, PE R LA, sclera anicteric. Oropharynx; not examined today Neck; supple, no JVD Cardiovascular; regular rate and rhythm with S1-S2, no murmur rub or gallop Pulmonary; Clear bilaterally Abdomen; soft, nontender, nondistended, no palpable mass or hepatosplenomegaly, bowel sounds are active Rectal; not done today Skin; benign exam, no jaundice rash or appreciable lesions Extremities; no clubbing cyanosis or edema skin warm and dry Neuro/Psych; alert and oriented x4, grossly nonfocal mood and affect appropriate        Assessment & Plan:   #15 50 year old female referred for colon cancer screening.  Average risk. #2 mild constipation #3 chronic bloating and gas symptoms present for years-possible lactose intolerance. #4 hypertension 5.  Obesity 6.  Hypothyroidism 7.  Asthma 8.  Mild sleep apnea (no oxygen or CPAP use  Plan; low gas diet Patient advised to minimize lactose intake, decrease carbonated beverage intake and consider avoiding artificial sweeteners other than Stevia to help with chronic bloating and gas. Try Gas-X 1-2 with meals. Discussed using MiraLAX on a more regular basis, 17 g in 8 ounces of water every other day. Patient will be scheduled for colonoscopy with Dr. Bryan Lemma.  Procedure was discussed in detail with the patient including indications risks and benefits and she is agreeable to proceed. As of today's visit patient is appropriate for endoscopic evaluation in the ambulatory care setting  Kyra Laffey Genia Harold PA-C 03/28/2021   Cc:  Carlena Hurl, PA-C

## 2021-03-28 NOTE — Patient Instructions (Signed)
If you are age 50 or younger, your body mass index should be between 19-25. Your Body mass index is 42.03 kg/m. If this is out of the aformentioned range listed, please consider follow up with your Primary Care Provider.  __________________________________________________________  The [facility] providers would like to encourage you to use [facility] to communicate with providers for non-urgent requests or questions.  Due to long hold times on the telephone, sending your provider a message by [facility] may be a faster and more efficient way to get a response.  Please allow 48 business hours for a response.  Please remember that this is for non-urgent requests.   You have been scheduled for a colonoscopy. Please follow written instructions given to you at your visit today.  Please pick up your prep supplies at the pharmacy within the next 1-3 days. If you use inhalers (even only as needed), please bring them with you on the day of your procedure.  Use Gas X 1-2 tablets with meals as needed  Use Miralax as needed for constipation, you may take daily if helpful  Follow a low gas diet, avoid artificial sweeteners and decrease soft drinks.  Follow up pending your Colonoscopy or as needed.  Thank you for entrusting me with your care and choosing [facility].  Amy Esterwood, PA-C

## 2021-03-30 NOTE — Progress Notes (Signed)
Agree with the assessment and plan as outlined by Amy Esterwood, PA-C.  Avari Nevares, DO, FACG  

## 2021-04-03 ENCOUNTER — Encounter: Payer: Self-pay | Admitting: Internal Medicine

## 2021-04-06 ENCOUNTER — Telehealth: Payer: Self-pay | Admitting: Physician Assistant

## 2021-04-06 MED ORDER — PEG 3350-KCL-NABCB-NACL-NASULF 236 G PO SOLR: 4000.0000 mL | Freq: Once | ORAL | 0 refills | Status: AC

## 2021-04-06 NOTE — Telephone Encounter (Signed)
Patient called states the pharmacy told her she should have a coupon for the Suprep medication please call her.

## 2021-04-06 NOTE — Telephone Encounter (Signed)
Left message that a new prep has been sent to the patient's pharmacy and that she will be able to find new directions on her My Chart.

## 2021-04-09 ENCOUNTER — Encounter: Payer: Self-pay | Admitting: Internal Medicine

## 2021-04-09 ENCOUNTER — Ambulatory Visit (AMBULATORY_SURGERY_CENTER): Payer: Commercial Managed Care - PPO | Admitting: Gastroenterology

## 2021-04-09 ENCOUNTER — Other Ambulatory Visit: Payer: Self-pay

## 2021-04-09 ENCOUNTER — Encounter: Payer: Self-pay | Admitting: Gastroenterology

## 2021-04-09 VITALS — BP 129/88 | HR 81 | Temp 98.2°F | Resp 14 | Ht 65.0 in | Wt 252.0 lb

## 2021-04-09 DIAGNOSIS — D123 Benign neoplasm of transverse colon: Secondary | ICD-10-CM | POA: Diagnosis not present

## 2021-04-09 DIAGNOSIS — D125 Benign neoplasm of sigmoid colon: Secondary | ICD-10-CM | POA: Diagnosis not present

## 2021-04-09 DIAGNOSIS — Z1211 Encounter for screening for malignant neoplasm of colon: Secondary | ICD-10-CM | POA: Diagnosis present

## 2021-04-09 DIAGNOSIS — K640 First degree hemorrhoids: Secondary | ICD-10-CM

## 2021-04-09 DIAGNOSIS — R140 Abdominal distension (gaseous): Secondary | ICD-10-CM

## 2021-04-09 LAB — HM COLONOSCOPY

## 2021-04-09 MED ORDER — SODIUM CHLORIDE 0.9 % IV SOLN
500.0000 mL | Freq: Once | INTRAVENOUS | Status: DC
Start: 2021-04-09 — End: 2021-04-09

## 2021-04-09 NOTE — Op Note (Signed)
[facility] Patient Name: Rebecca Soto Procedure Date: 04/09/2021 11:10 AM MRN: [id number] Endoscopist: Gerrit Heck , MD Age: 50 Referring MD:  Date of Birth: 05/23/1971 Gender: Female Account #: [id number] Procedure:                Colonoscopy Indications:              Screening for colorectal malignant neoplasm, This                            is the patient's first colonoscopy Medicines:                Monitored Anesthesia Care Procedure:                Pre-Anesthesia Assessment:                           - Prior to the procedure, a History and Physical                            was performed, and patient medications and                            allergies were reviewed. The patient's tolerance of                            previous anesthesia was also reviewed. The risks                            and benefits of the procedure and the sedation                            options and risks were discussed with the patient.                            All questions were answered, and informed consent                            was obtained. Prior Anticoagulants: The patient has                            taken no previous anticoagulant or antiplatelet                            agents. ASA Grade Assessment: III - A patient with                            severe systemic disease. After reviewing the risks                            and benefits, the patient was deemed in                            satisfactory condition to undergo the procedure.  After obtaining informed consent, the colonoscope                            was passed under direct vision. Throughout the                            procedure, the patient's blood pressure, pulse, and                            oxygen saturations were monitored continuously. The                            Colonoscope was introduced through the anus and                            advanced to the the  cecum, identified by                            appendiceal orifice and ileocecal valve. The                            colonoscopy was technically difficult and complex                            due to poor bowel prep. The patient tolerated the                            procedure well. The quality of the bowel                            preparation was fair. The ileocecal valve,                            appendiceal orifice, and rectum were photographed. Scope In: 11:22:50 AM Scope Out: 11:39:57 AM Scope Withdrawal Time: 0 hours 12 minutes 30 seconds  Total Procedure Duration: 0 hours 17 minutes 7 seconds  Findings:                 The perianal and digital rectal examinations were                            normal.                           Two sessile polyps were found in the sigmoid colon                            and transverse colon. The polyps were 3 to 5 mm in                            size. These polyps were removed with a cold snare.                            Resection and retrieval were complete. Estimated  blood loss was minimal.                           A moderate amount of semi-liquid stool and solid                            food debris was found in the proximal transverse                            colon, in the ascending colon, and in the cecum,                            interfering with visualization. Lavage of the area                            was performed using copious amounts of tap water,                            resulting in incomplete clearance with fair                            visualization. The colonoscope clogged several                            times due to solid food debris, which limited the                            ability to fully visualize these areas.                           Non-bleeding internal hemorrhoids were found during                            retroflexion. The hemorrhoids were  small. Complications:            No immediate complications. Estimated Blood Loss:     Estimated blood loss was minimal. Impression:               - Preparation of the colon was fair.                           - Two 3 to 5 mm polyps in the sigmoid colon and in                            the transverse colon, removed with a cold snare.                            Resected and retrieved.                           - Stool in the transverse colon, in the ascending                            colon and in the cecum.                           -  Non-bleeding internal hemorrhoids. Recommendation:           - Patient has a contact number available for                            emergencies. The signs and symptoms of potential                            delayed complications were discussed with the                            patient. Return to normal activities tomorrow.                            Written discharge instructions were provided to the                            patient.                           - Resume previous diet.                           - Continue present medications.                           - Await pathology results.                           - Repeat colonoscopy within 1 year because the                            bowel preparation was suboptimal in the right                            colon. Recommend extended 2-day prep with repeat                            colonoscopy.                           - Return to GI clinic PRN.                           - Use fiber, for example Citrucel, Fibercon, Konsyl                            or Metamucil.                           - Continue Miralax as previously discussed in the                            GI Clinic along with continued adequate hydration,                            with at least 64 oz of  water per day. Gerrit Heck, MD 04/09/2021 11:47:00 AM

## 2021-04-09 NOTE — Progress Notes (Signed)
Pt's states no medical or surgical changes since previsit or office visit. VS assessed by N.C ?

## 2021-04-09 NOTE — Progress Notes (Signed)
GASTROENTEROLOGY PROCEDURE H&P NOTE   Primary Care Physician: Carlena Hurl, PA-C    Reason for Procedure:  Colon cancer screening; chronic abdominal bloating, increased gas  Plan:    Colonoscopy  Patient is appropriate for endoscopic procedure(s) in the ambulatory ([place]) setting.  The nature of the procedure, as well as the risks, benefits, and alternatives were carefully and thoroughly reviewed with the patient. Ample time for discussion and questions allowed. The patient understood, was satisfied, and agreed to proceed.     HPI: Rebecca Soto is a 50 y.o. female who presents for colonoscopy for routine Colon Cancer screening.  Separately, has longstanding history of chronic abdominal bloating and gas symptoms, with mild intermittent constipation.  Patient was most recently seen in the Gastroenterology Clinic on 03/28/2021 by Nicoletta Ba, PA-C.  No interval change in medical history since that appointment. Please refer to that note for full details regarding GI history and clinical presentation.   Past Medical History:  Diagnosis Date   Allergy    Asthma    Goiter    Herpes labialis 08/22/2017   Hypertension 2019    Past Surgical History:  Procedure Laterality Date   ABDOMINAL HYSTERECTOMY  2008   still has ovaries, fibroids    Prior to Admission medications   Medication Sig Start Date End Date Taking? Authorizing Provider  albuterol (VENTOLIN HFA) 108 (90 Base) MCG/ACT inhaler TAKE 2 PUFFS BY MOUTH EVERY 6 HOURS AS NEEDED FOR WHEEZE OR SHORTNESS OF BREATH 03/27/21  Yes Tysinger, Camelia Eng, PA-C  amLODipine (NORVASC) 5 MG tablet Take 1 tablet (5 mg total) by mouth daily. 11/24/20  Yes Tysinger, Camelia Eng, PA-C  budesonide-formoterol (SYMBICORT) 160-4.5 MCG/ACT inhaler TAKE 2 PUFFS BY MOUTH TWICE A DAY 11/24/20  Yes Tysinger, Camelia Eng, PA-C  cholecalciferol (VITAMIN D3) 25 MCG (1000 UNIT) tablet Take 1 tablet (1,000 Units total) by mouth daily. 11/24/20  Yes Tysinger, Camelia Eng, PA-C  levothyroxine (SYNTHROID) 50 MCG tablet Take 1 tablet (50 mcg total) by mouth daily. 11/24/20 11/24/21 Yes Tysinger, Camelia Eng, PA-C  montelukast (SINGULAIR) 10 MG tablet TAKE 1 TABLET BY MOUTH EVERYDAY AT BEDTIME 03/19/21  Yes Tysinger, Camelia Eng, PA-C  rosuvastatin (CRESTOR) 10 MG tablet Take 1 tablet (10 mg total) by mouth daily. 11/24/20 11/24/21 Yes Tysinger, Camelia Eng, PA-C  Semaglutide,0.25 or 0.'5MG'$ /DOS, (OZEMPIC, 0.25 OR 0.5 MG/DOSE,) 2 MG/1.5ML SOPN Inject into the skin.   Yes [provider]  valACYclovir (VALTREX) 1000 MG tablet Take 2,000 mg by mouth q12h x 1 day. Start ASAP at onset of symptoms. 11/24/20   Tysinger, Camelia Eng, PA-C    Current Outpatient Medications  Medication Sig Dispense Refill   albuterol (VENTOLIN HFA) 108 (90 Base) MCG/ACT inhaler TAKE 2 PUFFS BY MOUTH EVERY 6 HOURS AS NEEDED FOR WHEEZE OR SHORTNESS OF BREATH 6.7 each 0   amLODipine (NORVASC) 5 MG tablet Take 1 tablet (5 mg total) by mouth daily. 90 tablet 3   budesonide-formoterol (SYMBICORT) 160-4.5 MCG/ACT inhaler TAKE 2 PUFFS BY MOUTH TWICE A DAY 30.6 each 3   cholecalciferol (VITAMIN D3) 25 MCG (1000 UNIT) tablet Take 1 tablet (1,000 Units total) by mouth daily. 90 tablet 3   levothyroxine (SYNTHROID) 50 MCG tablet Take 1 tablet (50 mcg total) by mouth daily. 30 tablet 11   montelukast (SINGULAIR) 10 MG tablet TAKE 1 TABLET BY MOUTH EVERYDAY AT BEDTIME 90 tablet 1   rosuvastatin (CRESTOR) 10 MG tablet Take 1 tablet (10 mg total) by mouth daily.  90 tablet 3   Semaglutide,0.25 or 0.'5MG'$ /DOS, (OZEMPIC, 0.25 OR 0.5 MG/DOSE,) 2 MG/1.5ML SOPN Inject into the skin.     valACYclovir (VALTREX) 1000 MG tablet Take 2,000 mg by mouth q12h x 1 day. Start ASAP at onset of symptoms. 30 tablet 2   Current Facility-Administered Medications  Medication Dose Route Frequency Provider Last Rate Last Admin   0.9 %  sodium chloride infusion  500 mL Intravenous Once Doaa Kendzierski V, DO        Allergies as of 04/09/2021 -  Review Complete 04/09/2021  Allergen Reaction Noted   Other Anaphylaxis and Hives 10/16/2011   Iohexol Hives 06/01/2014   Aspirin Other (See Comments) 10/16/2011   Chlorpheniramine Itching 11/01/2016   Contrast media [iodinated diagnostic agents] Hives 02/15/2021   Diphenhydramine hcl Other (See Comments) 10/16/2011   Latex  12/21/2012    Family History  Problem Relation Age of Onset   Cancer Other    Hypertension Other    Hyperlipidemia Other    Hypertension Mother    Heart disease Mother        cardiomegaly   Other Mother        brain tumor   Aneurysm Mother        brain   Hypertension Father    Cancer Father        throat   Hypertension Brother    Diabetes Brother    Diabetes Maternal Grandmother    Hypertension Maternal Grandmother    Hypertension Maternal Grandfather    Hypertension Sister    Hypertension Paternal Grandmother    Hypertension Paternal Grandfather    Hypertension Sister    Diabetes Brother    Hypertension Brother    Diabetes Brother    Hypertension Brother    Diabetes Brother    Hypertension Brother    Diabetes Brother    Hypertension Brother    Diabetes Brother    Hypertension Brother    Hypertension Brother     Social History   Socioeconomic History   Marital status: Single    Spouse name: Not on file   Number of children: Not on file   Years of education: Not on file   Highest education level: Not on file  Occupational History   Not on file  Tobacco Use   Smoking status: Never   Smokeless tobacco: Never  Vaping Use   Vaping Use: Never used  Substance and Sexual Activity   Alcohol use: Yes    Alcohol/week: 2.0 standard drinks    Types: 2 Glasses of wine per week   Drug use: No   Sexual activity: Yes    Birth control/protection: Surgical  Other Topics Concern   Not on file  Social History Narrative   Lives with mom and son.  Has 1 boyfriend times 1 year.  Works as Marine scientist, [employer]  Exercise - walking.   11/2020   Social Determinants of Health   Financial Resource Strain: Not on file  Food Insecurity: Not on file  Transportation Needs: Not on file  Physical Activity: Not on file  Stress: Not on file  Social Connections: Not on file  Intimate Partner Violence: Not on file    Physical Exam: Vital signs in last 24 hours: '@BP'$  122/73   Pulse 86   Temp 98.2 F (36.8 C) (Skin)   Ht '5\' 5"'$  (1.651 m)   Wt 252 lb (114.3 kg)   SpO2 96%   BMI 41.93 kg/m  GEN: NAD EYE: Sclerae  anicteric ENT: MMM CV: Non-tachycardic Pulm: CTA b/l GI: Soft, NT/ND NEURO:  Alert & Oriented x 3   Gerrit Heck, DO [facility]   04/09/2021 11:14 AM

## 2021-04-09 NOTE — Progress Notes (Signed)
Called to room to assist during endoscopic procedure.  Patient ID and intended procedure confirmed with present staff. Received instructions for my participation in the procedure from the performing physician.  

## 2021-04-09 NOTE — Progress Notes (Signed)
Report given to PACU, vss 

## 2021-04-09 NOTE — Patient Instructions (Signed)
Handout provided on polyps and hemorrhoids.   Use fiber, for example Citrucel, Fibercon, Konsyl or Metamucil.   Continue Miralax as previously discussed in the GI Clinic along with continued adequate hydration, with at least 64 ounces of water per day.   Repeat colonoscopy in 1 year because the bowel preparation was suboptimal in the right colon. Recommend extended 2-day prep with repeat colonoscopy.   YOU HAD AN ENDOSCOPIC PROCEDURE TODAY AT [facility]:   Refer to the procedure report that was given to you for any specific questions about what was found during the examination.  If the procedure report does not answer your questions, please call your gastroenterologist to clarify.  If you requested that your care partner not be given the details of your procedure findings, then the procedure report has been included in a sealed envelope for you to review at your convenience later.  YOU SHOULD EXPECT: Some feelings of bloating in the abdomen. Passage of more gas than usual.  Walking can help get rid of the air that was put into your GI tract during the procedure and reduce the bloating. If you had a lower endoscopy (such as a colonoscopy or flexible sigmoidoscopy) you may notice spotting of blood in your stool or on the toilet paper. If you underwent a bowel prep for your procedure, you may not have a normal bowel movement for a few days.  Please Note:  You might notice some irritation and congestion in your nose or some drainage.  This is from the oxygen used during your procedure.  There is no need for concern and it should clear up in a day or so.  SYMPTOMS TO REPORT IMMEDIATELY:  Following lower endoscopy (colonoscopy or flexible sigmoidoscopy):  Excessive amounts of blood in the stool  Significant tenderness or worsening of abdominal pains  Swelling of the abdomen that is new, acute  Fever of 100F or higher  For urgent or emergent issues, a gastroenterologist can be  reached at any hour by calling [phone]. Do not use MyChart messaging for urgent concerns.    DIET:  We do recommend a small meal at first, but then you may proceed to your regular diet.  Drink plenty of fluids but you should avoid alcoholic beverages for 24 hours.  ACTIVITY:  You should plan to take it easy for the rest of today and you should NOT DRIVE or use heavy machinery until tomorrow (because of the sedation medicines used during the test).    FOLLOW UP: Our staff will call the number listed on your records 48-72 hours following your procedure to check on you and address any questions or concerns that you may have regarding the information given to you following your procedure. If we do not reach you, we will leave a message.  We will attempt to reach you two times.  During this call, we will ask if you have developed any symptoms of COVID 19. If you develop any symptoms (ie: fever, flu-like symptoms, shortness of breath, cough etc.) before then, please call [phone].  If you test positive for Covid 19 in the 2 weeks post procedure, please call and report this information to [place].    If any biopsies were taken you will be contacted by phone or by letter within the next 1-3 weeks.  Please call us at [phone] if you have not heard about the biopsies in 3 weeks.    SIGNATURES/CONFIDENTIALITY: You and/or your care partner have signed paperwork  which will be entered into your electronic medical record.  These signatures attest to the fact that that the information above on your After Visit Summary has been reviewed and is understood.  Full responsibility of the confidentiality of this discharge information lies with you and/or your care-partner.

## 2021-04-11 ENCOUNTER — Telehealth: Payer: Self-pay | Admitting: *Deleted

## 2021-04-11 NOTE — Telephone Encounter (Signed)
  Follow up Call-  Call back number 04/09/2021  Post procedure Call Back phone  # [phone]  Permission to leave phone message Yes  Some recent data might be hidden     Patient questions:  Do you have a fever, pain , or abdominal swelling? No. Pain Score  0 *  Have you tolerated food without any problems? Yes.    Have you been able to return to your normal activities? Yes.    Do you have any questions about your discharge instructions: Diet   No. Medications  no Follow up visit  No.  Do you have questions or concerns about your Care? No.  Actions: * If pain score is 4 or above: No action needed, pain <4.  Have you developed a fever since your procedure? no  2.   Have you had an respiratory symptoms (SOB or cough) since your procedure? no  3.   Have you tested positive for COVID 19 since your procedure no  4.   Have you had any family members/close contacts diagnosed with the COVID 19 since your procedure?  no   If yes to any of these questions please route to Joylene John, RN and Joella Prince, RN

## 2021-04-16 ENCOUNTER — Encounter: Payer: Self-pay | Admitting: Gastroenterology

## 2021-04-18 ENCOUNTER — Ambulatory Visit: Payer: Commercial Managed Care - PPO | Admitting: Medical

## 2021-04-18 ENCOUNTER — Other Ambulatory Visit: Payer: Self-pay

## 2021-04-18 VITALS — BP 130/86 | HR 80 | Temp 98.0°F | Wt 249.2 lb

## 2021-04-18 DIAGNOSIS — I10 Essential (primary) hypertension: Secondary | ICD-10-CM

## 2021-04-18 DIAGNOSIS — Z6841 Body Mass Index (BMI) 40.0 and over, adult: Secondary | ICD-10-CM

## 2021-04-18 DIAGNOSIS — M7989 Other specified soft tissue disorders: Secondary | ICD-10-CM

## 2021-04-18 DIAGNOSIS — E039 Hypothyroidism, unspecified: Secondary | ICD-10-CM

## 2021-04-18 MED ORDER — HYDROCHLOROTHIAZIDE 12.5 MG PO TABS: 12.5000 mg | ORAL_TABLET | Freq: Every day | ORAL | 2 refills | Status: DC

## 2021-04-18 NOTE — Progress Notes (Signed)
Subjective:  Rebecca Soto is a 50 y.o. female who presents for Chief Complaint  Patient presents with   swelling in legs    Swelling in legs, has a knot on left leg      Here for recent swelling in legs, worse in left calve.  Has a small area of swelling and a knot.   No color changes.   Slight tenderness.   No recent trauma, fall, injury, no recent long travel, no recent surgery, not on hormonal therapy.  She is active, walking, not using much salt.  Compliant with amlodipine.    No chest pain, no dyspnea.     Home Bp recently 127/74  No other aggravating or relieving factors.    No other c/o.  Past Medical History:  Diagnosis Date   Allergy    Asthma    Goiter    Herpes labialis 08/22/2017   Hypertension 2019   Current Outpatient Medications on File Prior to Visit  Medication Sig Dispense Refill   albuterol (VENTOLIN HFA) 108 (90 Base) MCG/ACT inhaler TAKE 2 PUFFS BY MOUTH EVERY 6 HOURS AS NEEDED FOR WHEEZE OR SHORTNESS OF BREATH 6.7 each 0   amLODipine (NORVASC) 5 MG tablet Take 1 tablet (5 mg total) by mouth daily. 90 tablet 3   budesonide-formoterol (SYMBICORT) 160-4.5 MCG/ACT inhaler TAKE 2 PUFFS BY MOUTH TWICE A DAY 30.6 each 3   cholecalciferol (VITAMIN D3) 25 MCG (1000 UNIT) tablet Take 1 tablet (1,000 Units total) by mouth daily. 90 tablet 3   levothyroxine (SYNTHROID) 50 MCG tablet Take 1 tablet (50 mcg total) by mouth daily. 30 tablet 11   montelukast (SINGULAIR) 10 MG tablet TAKE 1 TABLET BY MOUTH EVERYDAY AT BEDTIME 90 tablet 1   rosuvastatin (CRESTOR) 10 MG tablet Take 1 tablet (10 mg total) by mouth daily. 90 tablet 3   Semaglutide,0.25 or 0.5MG /DOS, (OZEMPIC, 0.25 OR 0.5 MG/DOSE,) 2 MG/1.5ML SOPN Inject into the skin.     valACYclovir (VALTREX) 1000 MG tablet Take 2,000 mg by mouth q12h x 1 day. Start ASAP at onset of symptoms. 30 tablet 2   No current facility-administered medications on file prior to visit.     The following portions of the  patient's history were reviewed and updated as appropriate: allergies, current medications, past family history, past medical history, past social history, past surgical history and problem list.  ROS Otherwise as in subjective above  Objective: BP 130/86   Pulse 80   Temp 98 F (36.7 C)   Wt 249 lb 3.2 oz (113 kg)   BMI 41.47 kg/m   Wt Readings from Last 3 Encounters:  04/18/21 249 lb 3.2 oz (113 kg)  04/09/21 252 lb (114.3 kg)  03/28/21 252 lb 9.6 oz (114.6 kg)   BP Readings from Last 3 Encounters:  04/18/21 130/86  04/09/21 129/88  03/28/21 134/80    General appearance: alert, no distress, well developed, well nourished Faint bilat LE swelling, asymmetry of calves, left calve larger diameter compared to right, no palpable cord, no warmth, no discoloration Pulses: 2+ radial pulses, 2+ pedal pulses, normal cap refill No obvious dyspnea or wheezing     Assessment: Encounter Diagnoses  Name Primary?   Leg swelling Yes   Essential hypertension, benign    Hypothyroidism, unspecified type    BMI 40.0-44.9, adult ([place])      Plan: Leg swelling-we discussed possible causes.  Limit salt, continue regular exercise, continue compression socks or compression hose.  We will send for  ultrasound to rule out other causes.  I suspect excess fatty tissue.  No significant signs of DVT  Hypertension-continue amlodipine, and hydrochlorothiazide  Hypothyroidism-managed by endocrinology  I recommend weight loss through healthy diet and exercise  Calliope was seen today for swelling in legs.  Diagnoses and all orders for this visit:  Leg swelling -     US Venous Img Lower Bilateral; Future  Essential hypertension, benign -     US Venous Img Lower Bilateral; Future  Hypothyroidism, unspecified type  BMI 40.0-44.9, adult ([place])  Other orders -     hydrochlorothiazide (HYDRODIURIL) 12.5 MG tablet; Take 1 tablet (12.5 mg total) by mouth daily.   Follow up: pending ultrasound

## 2021-04-19 ENCOUNTER — Ambulatory Visit
Admission: RE | Admit: 2021-04-19 | Discharge: 2021-04-19 | Disposition: A | Discharge status: Home / Self Care | Payer: Commercial Managed Care - PPO | Source: Ambulatory Visit | Attending: Medical | Admitting: Medical

## 2021-04-19 DIAGNOSIS — I10 Essential (primary) hypertension: Secondary | ICD-10-CM

## 2021-04-19 DIAGNOSIS — M7989 Other specified soft tissue disorders: Secondary | ICD-10-CM

## 2021-04-24 ENCOUNTER — Other Ambulatory Visit: Payer: Self-pay | Admitting: Medical

## 2021-04-29 ENCOUNTER — Other Ambulatory Visit: Payer: Self-pay

## 2021-04-29 ENCOUNTER — Ambulatory Visit (HOSPITAL_COMMUNITY)
Admission: EM | Admit: 2021-04-29 | Discharge: 2021-04-29 | Disposition: A | Discharge status: Home / Self Care | Payer: Commercial Managed Care - PPO | Attending: Physician Assistant | Admitting: Physician Assistant

## 2021-04-29 ENCOUNTER — Encounter (HOSPITAL_COMMUNITY): Payer: Self-pay

## 2021-04-29 DIAGNOSIS — M79604 Pain in right leg: Secondary | ICD-10-CM

## 2021-04-29 DIAGNOSIS — R2242 Localized swelling, mass and lump, left lower limb: Secondary | ICD-10-CM | POA: Diagnosis not present

## 2021-04-29 DIAGNOSIS — R2240 Localized swelling, mass and lump, unspecified lower limb: Secondary | ICD-10-CM

## 2021-04-29 DIAGNOSIS — R079 Chest pain, unspecified: Secondary | ICD-10-CM

## 2021-04-29 DIAGNOSIS — M79605 Pain in left leg: Secondary | ICD-10-CM

## 2021-04-29 DIAGNOSIS — I739 Peripheral vascular disease, unspecified: Secondary | ICD-10-CM | POA: Diagnosis present

## 2021-04-29 DIAGNOSIS — R2241 Localized swelling, mass and lump, right lower limb: Secondary | ICD-10-CM | POA: Diagnosis not present

## 2021-04-29 LAB — POCT URINALYSIS DIPSTICK, ED / UC
Glucose, UA: NEGATIVE mg/dL
Ketones, ur: 40 mg/dL — AB
Leukocytes,Ua: NEGATIVE
Nitrite: NEGATIVE
Protein, ur: NEGATIVE mg/dL
Specific Gravity, Urine: 1.025 (ref 1.005–1.030)
Urobilinogen, UA: 0.2 mg/dL (ref 0.0–1.0)
pH: 6 (ref 5.0–8.0)

## 2021-04-29 LAB — COMPREHENSIVE METABOLIC PANEL
ALT: 14 U/L (ref 0–44)
AST: 18 U/L (ref 15–41)
Albumin: 4 g/dL (ref 3.5–5.0)
Alkaline Phosphatase: 66 U/L (ref 38–126)
Anion gap: 12 (ref 5–15)
BUN: 7 mg/dL (ref 6–20)
CO2: 24 mmol/L (ref 22–32)
Calcium: 9.5 mg/dL (ref 8.9–10.3)
Chloride: 101 mmol/L (ref 98–111)
Creatinine, Ser: 0.87 mg/dL (ref 0.44–1.00)
GFR, Estimated: >60 mL/min (ref >60)
Glucose, Bld: 83 mg/dL (ref 70–99)
Potassium: 3.2 mmol/L — ABNORMAL LOW (ref 3.5–5.1)
Sodium: 137 mmol/L (ref 135–145)
Total Bilirubin: 0.8 mg/dL (ref 0.3–1.2)
Total Protein: 8.2 g/dL — ABNORMAL HIGH (ref 6.5–8.1)

## 2021-04-29 LAB — CBC
HCT: 42.4 % (ref 36.0–46.0)
Hemoglobin: 14.1 g/dL (ref 12.0–15.0)
MCH: 29.7 pg (ref 26.0–34.0)
MCHC: 33.3 g/dL (ref 30.0–36.0)
MCV: 89.3 fL (ref 80.0–100.0)
Platelets: 347 10*3/uL (ref 150–400)
RBC: 4.75 MIL/uL (ref 3.87–5.11)
RDW: 12.8 % (ref 11.5–15.5)
WBC: 5.6 10*3/uL (ref 4.0–10.5)
nRBC: 0 % (ref 0.0–0.2)

## 2021-04-29 LAB — TSH: TSH: 0.581 u[IU]/mL (ref 0.350–4.500)

## 2021-04-29 LAB — BRAIN NATRIURETIC PEPTIDE: B Natriuretic Peptide: 7.7 pg/mL (ref 0.0–100.0)

## 2021-04-29 NOTE — ED Provider Notes (Signed)
[facility]    CSN: [id number] Arrival date & time: 04/29/21  1033      History   Chief Complaint Chief Complaint  Patient presents with   Leg Pain    HPI Rebecca Soto is a 50 y.o. female.   Patient presents today with 3-week history of increasing lower extremity edema and discomfort.  She reports lower leg swelling is nonpitting.  Reports pain is present only with ambulation and improves at rest.  She has been seen by primary care provider who ordered DVT ultrasound that was negative approximately 1 week ago.  She was also started on hydrochlorothiazide which has not provided much relief of symptoms.  She does have a history of thyroid condition and her last TSH obtained May 2022 showed overtreatment and so dose was decreased.  She has not had TSH rechecked since that time but has not had significant hypothyroid symptoms.  She denies history of heart failure, chronic liver or kidney disease, lymphedema.  She has been using compression with minimal improvement of symptoms.  She has not seen a vascular surgeon in the past.  Denies previous pelvic surgery or lymph node dissection; did have a hysterectomy in the 90s denies any recent procedures.  She denies any shortness of breath but does report occasional chest discomfort.  She is currently asymptomatic but is interested in having EKG obtained today given intermittent symptoms.   Past Medical History:  Diagnosis Date   Allergy    Asthma    Goiter    Herpes labialis 08/22/2017   Hypertension 2019    Patient Active Problem List   Diagnosis Date Noted   Leg swelling 04/18/2021   BMI 40.0-44.9, adult ([place]) 04/18/2021   Screen for STD (sexually transmitted disease) 11/23/2020   Screen for colon cancer 11/23/2020   Encounter for screening mammogram for malignant neoplasm of breast 11/23/2020   Screening for lipid disorders 11/23/2020   Hair loss 11/23/2020   Daytime somnolence 10/29/2019   Fatigue 10/29/2019    Arthralgia of hand 10/29/2019   Elbow pain 10/29/2019   Ulnar nerve compression, 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   Acanthosis nigricans 05/31/2019   Blood in urine 05/31/2019   Candidal vulvovaginitis 05/31/2019   Constipation 05/31/2019   Dysuria 03/19/2019   Hx of seasonal allergies 10/15/2018   Nocturia 10/15/2018   Gastroesophageal reflux disease 10/15/2018   Moderate persistent asthma without complication 96/22/2979   Routine general medical examination at a health care facility 08/14/2018   Nonintractable headache 08/14/2018   Abnormal mammogram 08/14/2018   Goiter 08/14/2018   Renal cyst 08/14/2018   Essential hypertension, benign 07/23/2018   Family history of cancer 04/17/2018   Herpes labialis 08/22/2017   Low back pain 06/07/2016   Frequent urination 06/07/2016   History of renal stone 06/07/2016   Hypothyroidism 06/07/2016    Past Surgical History:  Procedure Laterality Date   ABDOMINAL HYSTERECTOMY  2008   still has ovaries, fibroids    OB History   No obstetric history on file.      Home Medications    Prior to Admission medications   Medication Sig Start Date End Date Taking? Authorizing Provider  albuterol (VENTOLIN HFA) 108 (90 Base) MCG/ACT inhaler TAKE 2 PUFFS BY MOUTH EVERY 6 HOURS AS NEEDED FOR WHEEZE OR SHORTNESS OF BREATH 04/24/21   Tysinger, Camelia Eng, PA-C  amLODipine (NORVASC) 5 MG tablet Take 1 tablet (5 mg total) by mouth daily. 11/24/20   Tysinger, Camelia Eng, PA-C  budesonide-formoterol (SYMBICORT) 160-4.5 MCG/ACT  inhaler TAKE 2 PUFFS BY MOUTH TWICE A DAY 11/24/20   Tysinger, Camelia Eng, PA-C  cholecalciferol (VITAMIN D3) 25 MCG (1000 UNIT) tablet Take 1 tablet (1,000 Units total) by mouth daily. 11/24/20   Tysinger, Camelia Eng, PA-C  hydrochlorothiazide (HYDRODIURIL) 12.5 MG tablet Take 1 tablet (12.5 mg total) by mouth daily. 04/18/21   Tysinger, Camelia Eng, PA-C  levothyroxine (SYNTHROID) 50 MCG tablet Take 1 tablet (50 mcg total) by mouth daily. 11/24/20 11/24/21  Tysinger,  Camelia Eng, PA-C  montelukast (SINGULAIR) 10 MG tablet TAKE 1 TABLET BY MOUTH EVERYDAY AT BEDTIME 03/19/21   Tysinger, Camelia Eng, PA-C  rosuvastatin (CRESTOR) 10 MG tablet Take 1 tablet (10 mg total) by mouth daily. 11/24/20 11/24/21  Tysinger, Camelia Eng, PA-C  Semaglutide,0.25 or 0.5MG /DOS, (OZEMPIC, 0.25 OR 0.5 MG/DOSE,) 2 MG/1.5ML SOPN Inject into the skin.    [provider]  valACYclovir (VALTREX) 1000 MG tablet Take 2,000 mg by mouth q12h x 1 day. Start ASAP at onset of symptoms. 11/24/20   Tysinger, Camelia Eng, PA-C    Family History Family History  Problem Relation Age of Onset   Cancer Other    Hypertension Other    Hyperlipidemia Other    Hypertension Mother    Heart disease Mother        cardiomegaly   Other Mother        brain tumor   Aneurysm Mother        brain   Hypertension Father    Cancer Father        throat   Hypertension Brother    Diabetes Brother    Diabetes Maternal Grandmother    Hypertension Maternal Grandmother    Hypertension Maternal Grandfather    Hypertension Sister    Hypertension Paternal Grandmother    Hypertension Paternal Grandfather    Hypertension Sister    Diabetes Brother    Hypertension Brother    Diabetes Brother    Hypertension Brother    Diabetes Brother    Hypertension Brother    Diabetes Brother    Hypertension Brother    Diabetes Brother    Hypertension Brother    Hypertension Brother     Social History Social History   Tobacco Use   Smoking status: Never   Smokeless tobacco: Never  Vaping Use   Vaping Use: Never used  Substance Use Topics   Alcohol use: Yes    Alcohol/week: 2.0 standard drinks    Types: 2 Glasses of wine per week   Drug use: No     Allergies   Other, Iohexol, Aspirin, Chlorpheniramine, Diphenhydramine, Diphenhydramine hcl, Iodinated diagnostic agents, and Latex   Review of Systems Review of Systems  Constitutional:  Positive for activity change. Negative for appetite change, fatigue and fever.   Respiratory:  Negative for cough and shortness of breath.   Cardiovascular:  Positive for chest pain (intermittent) and leg swelling. Negative for palpitations.  Gastrointestinal:  Negative for abdominal pain, diarrhea, nausea and vomiting.  Musculoskeletal:  Positive for gait problem and myalgias. Negative for arthralgias.  Neurological:  Positive for headaches. Negative for dizziness and light-headedness.    Physical Exam Triage Vital Signs ED Triage Vitals  Enc Vitals Group     BP 04/29/21 1213 138/87     Pulse Rate 04/29/21 1213 81     Resp 04/29/21 1213 18     Temp 04/29/21 1213 99 F (37.2 C)     Temp Source 04/29/21 1213 Oral     SpO2 04/29/21  1213 98 %     Weight --      Height --      Head Circumference --      Peak Flow --      Pain Score 04/29/21 1212 6     Pain Loc --      Pain Edu? --      Excl. in [place]? --    No data found.  Updated Vital Signs BP 138/87 (BP Location: Right Arm)   Pulse 81   Temp 99 F (37.2 C) (Oral)   Resp 18   SpO2 98%   Visual Acuity Right Eye Distance:   Left Eye Distance:   Bilateral Distance:    Right Eye Near:   Left Eye Near:    Bilateral Near:     Physical Exam Vitals reviewed.  Constitutional:      General: She is awake. She is not in acute distress.    Appearance: Normal appearance. She is well-developed. She is not ill-appearing.     Comments: Very pleasant female appears stated age in no acute distress sitting comfortably in exam room  HENT:     Head: Normocephalic and atraumatic.  Cardiovascular:     Rate and Rhythm: Normal rate and regular rhythm.     Pulses:          Posterior tibial pulses are 2+ on the right side and 2+ on the left side.     Heart sounds: Normal heart sounds, S1 normal and S2 normal. No murmur heard.    Comments: Nonpitting edema noted to knee bilaterally Pulmonary:     Effort: Pulmonary effort is normal.     Breath sounds: Normal breath sounds. No wheezing, rhonchi or rales.      Comments: Clear to auscultation bilaterally Abdominal:     Palpations: Abdomen is soft.     Tenderness: There is no abdominal tenderness.  Psychiatric:        Behavior: Behavior is cooperative.     UC Treatments / Results  Labs (all labs ordered are listed, but only abnormal results are displayed) Labs Reviewed  POCT URINALYSIS DIPSTICK, ED / UC - Abnormal; Notable for the following components:      Result Value   Bilirubin Urine SMALL (*)    Ketones, ur 40 (*)    Hgb urine dipstick MODERATE (*)    All other components within normal limits  CBC  COMPREHENSIVE METABOLIC PANEL  TSH  BRAIN NATRIURETIC PEPTIDE    EKG   Radiology No results found.  Procedures Procedures (including critical care time)  Medications Ordered in UC Medications - No data to display  Initial Impression / Assessment and Plan / UC Course  I have reviewed the triage vital signs and the nursing notes.  Pertinent labs & imaging results that were available during my care of the patient were reviewed by me and considered in my medical decision making (see chart for details).      EKG obtained per patient request given intermittent chest tightness showed normal sinus rhythm with ventricular rate of 73 bpm and no significant change compared to 07/09/2018 tracing.  UA showed no significant proteinuria but did show some blood.  Basic labs including TSH, CMP, CBC, BNP were obtained today-results pending.  Discussed with patient the need to follow-up with vascular surgeon and/or cardiology given clinical presentation for further evaluation and management.  Recommended conservative treatment including avoiding salt as well as keeping legs elevated and using compression.  No indication to  transition from a thiazide diuretic to loop diuretic given edema is nonpitting.  Discussed that if she has any worsening symptoms including recurrent chest pain, shortness of breath, increased swelling, sudden weight gain, nausea,  vomiting she needs to be reevaluated.  Strict return precautions given to which she expressed understanding.  Final Clinical Impressions(s) / UC Diagnoses   Final diagnoses:  Bilateral leg pain  Localized swelling of lower leg  Claudication of calf muscles (HCC)  Intermittent chest pain     Discharge Instructions      Your urine did not show any significant protein which is good news.  Her EKG was essentially normal.  We will contact you with your lab results if we need to arrange any treatment.  Please continue with conservative treatment including keeping your feet elevated, compression, avoiding salt.  I do think it is reasonable given you are having intermittent chest pain along with leg swelling to follow-up with cardiology.  You may also want to consider seeing a vascular surgeon for what is called ABIs due to claudication symptoms.  If you have any sudden worsening of symptoms you need to be reevaluated immediately as we discussed.     ED Prescriptions   None    PDMP not reviewed this encounter.   Terrilee Croak, PA-C 04/29/21 1305

## 2021-04-29 NOTE — Discharge Instructions (Signed)
Your urine did not show any significant protein which is good news.  Her EKG was essentially normal.  We will contact you with your lab results if we need to arrange any treatment.  Please continue with conservative treatment including keeping your feet elevated, compression, avoiding salt.  I do think it is reasonable given you are having intermittent chest pain along with leg swelling to follow-up with cardiology.  You may also want to consider seeing a vascular surgeon for what is called ABIs due to claudication symptoms.  If you have any sudden worsening of symptoms you need to be reevaluated immediately as we discussed.

## 2021-04-29 NOTE — ED Triage Notes (Signed)
Pt reports bilateral leg swelling and leg pain x 3 week. States pain is worse in the calf. Pt reports she was seen by her PCP and was told she has some fluids and needs to elevated the legs. Pt reports she had an ultrasound done 1 week ago.

## 2021-05-03 ENCOUNTER — Other Ambulatory Visit: Payer: Self-pay | Admitting: Medical

## 2021-05-03 MED ORDER — POTASSIUM CHLORIDE CRYS ER 10 MEQ PO TBCR: 10.0000 meq | EXTENDED_RELEASE_TABLET | Freq: Two times a day (BID) | ORAL | 1 refills | Status: DC

## 2021-05-15 ENCOUNTER — Other Ambulatory Visit: Payer: Self-pay | Admitting: Medical

## 2021-05-15 MED ORDER — FUROSEMIDE 20 MG PO TABS: 20.0000 mg | ORAL_TABLET | Freq: Every day | ORAL | 0 refills | Status: DC

## 2021-05-26 ENCOUNTER — Other Ambulatory Visit: Payer: Self-pay | Admitting: Medical

## 2021-05-30 NOTE — Progress Notes (Signed)
Cardiology Office Note:    Date:  05/31/2021   ID:  Rebecca Soto, DOB 06-28-1971, MRN [id number]  PCP:  Carlena Hurl, PA-C  Cardiologist:  None  Electrophysiologist:  None   Referring MD: Carlena Hurl, PA-C   Chief Complaint/Reason for Referral: Chest pain  History of Present Illness:     Rebecca Soto is a 50 y.o. female with the indicated history here for medications regarding chest pain.  The patient was seen in the emergency department a month ago with lower extremity edema.  She denied any exertional dyspnea but does get occasional chest discomfort.  An EKG was done which demonstrated sinus rhythm with possible LVH.  BNP was normal.  She is referred for further recommendations.  She had seen her primary care provider a few days earlier and had not reported any chest discomfort.  She reported lower extremity swelling and lower extremity ultrasound was done which showed no DVT.  The patient tells me that she occasionally has a hot chest discomfort in her upper abdomen.  Sometimes it radiates to her right chest.  She sometimes gets this but a little bit higher when she has problems with her asthma.  This only comes on when she is at rest and typically does not come on with exertion.  She denies any palpitations, paroxysmal nocturnal dyspnea, orthopnea, presyncope, syncope, or severe bleeding.  She does have GERD and only takes omeprazole on a as needed basis.  Additionally she has noticed some unilateral leg swelling involving her left lower extremity.  She had an ultrasound as detailed above which was negative.  She has been using compression therapy and Lasix for this now.  This does not involve her right lower extremity.   Past Medical History:  Diagnosis Date   Allergy    Asthma    Goiter    Herpes labialis 08/22/2017   Hypertension 2019    Past Surgical History:  Procedure Laterality Date   ABDOMINAL HYSTERECTOMY  2008   still has ovaries, fibroids     Current Medications: Current Meds  Medication Sig   albuterol (VENTOLIN HFA) 108 (90 Base) MCG/ACT inhaler TAKE 2 PUFFS BY MOUTH EVERY 6 HOURS AS NEEDED FOR WHEEZE OR SHORTNESS OF BREATH   amLODipine (NORVASC) 5 MG tablet Take 1 tablet (5 mg total) by mouth daily.   budesonide-formoterol (SYMBICORT) 160-4.5 MCG/ACT inhaler TAKE 2 PUFFS BY MOUTH TWICE A DAY   cholecalciferol (VITAMIN D3) 25 MCG (1000 UNIT) tablet Take 1 tablet (1,000 Units total) by mouth daily.   furosemide (LASIX) 20 MG tablet Take 1 tablet (20 mg total) by mouth daily.   KLOR-CON M10 10 MEQ tablet TAKE 1 TABLET BY MOUTH 2 TIMES DAILY.   levothyroxine (SYNTHROID) 50 MCG tablet Take 1 tablet (50 mcg total) by mouth daily.   montelukast (SINGULAIR) 10 MG tablet TAKE 1 TABLET BY MOUTH EVERYDAY AT BEDTIME   rosuvastatin (CRESTOR) 10 MG tablet Take 1 tablet (10 mg total) by mouth daily.   Semaglutide,0.25 or 0.5MG /DOS, (OZEMPIC, 0.25 OR 0.5 MG/DOSE,) 2 MG/1.5ML SOPN Inject into the skin once a week.   valACYclovir (VALTREX) 1000 MG tablet Take 2,000 mg by mouth q12h x 1 day. Start ASAP at onset of symptoms.     Allergies:   Chlorpheniramine, Diphenhydramine hcl, Iodinated diagnostic agents, Other, Aspirin, Diphenhydramine, Iohexol, and Latex   Social History   Tobacco Use   Smoking status: Never   Smokeless tobacco: Never  Vaping Use   Vaping Use:  Never used  Substance Use Topics   Alcohol use: Yes    Alcohol/week: 2.0 standard drinks    Types: 2 Glasses of wine per week   Drug use: No     Family History: The patient's family history includes Aneurysm in her mother; Cancer in her father and another family member; Diabetes in her brother, brother, brother, brother, brother, brother, and maternal grandmother; Heart disease in her mother; Hyperlipidemia in an other family member; Hypertension in her brother, brother, brother, brother, brother, brother, brother, father, maternal grandfather, maternal grandmother,  mother, paternal grandfather, paternal grandmother, sister, sister, and another family member; Other in her mother.  ROS:   Please see the history of present illness.    All other systems reviewed and are negative.  EKGs/Labs/Other Studies Reviewed:    The following studies were reviewed today:  EKG: EKG demonstrated normal sinus rhythm with possible left ventricular hypertrophy.  Imaging studies that I have independently reviewed today: Recent labs and notes as well as lower extremity ultrasound study.  Recent Labs: 04/29/2021: ALT 14; B Natriuretic Peptide 7.7; BUN 7; Creatinine, Ser 0.87; Hemoglobin 14.1; Platelets 347; Potassium 3.2; Sodium 137; TSH 0.581   Recent Lipid Panel    Component Value Date/Time   CHOL 199 11/23/2020 1331   TRIG 57 11/23/2020 1331   HDL 73 11/23/2020 1331   CHOLHDL 2.7 11/23/2020 1331   LDLCALC 115 (H) 11/23/2020 1331    Physical Exam:    VS:  BP 112/80   Pulse 84   Ht 5\' 5"  (1.651 m)   Wt 247 lb (112 kg)   SpO2 98%   BMI 41.10 kg/m     Wt Readings from Last 5 Encounters:  05/31/21 247 lb (112 kg)  04/18/21 249 lb 3.2 oz (113 kg)  04/09/21 252 lb (114.3 kg)  03/28/21 252 lb 9.6 oz (114.6 kg)  01/26/21 242 lb (109.8 kg)    GENERAL:  No apparent distress, AOx3 HEENT:  No carotid bruits, +2 carotid impulses, no scleral icterus CAR: RRR no murmurs, gallops, rubs, or thrills RES:  Clear to auscultation bilaterally ABD:  Soft, nontender, nondistended, positive bowel sounds x 4 VASC:  +2 radial pulses, +2 carotid pulses, palpable pedal pulses NEURO:  CN 2-12 grossly intact; motor and sensory grossly intact PSYCH:  No active depression or anxiety EXT:  No edema, ecchymosis, or cyanosis  ASSESSMENT:    1. Chest pain, unspecified type   2. Lower extremity edema   3. Hyperlipidemia, unspecified hyperlipidemia type   4. Precordial pain    PLAN:    Chest pain, unspecified type Will obtain a coronary CTA to evaluate further.  There are  some aspects to her symptoms that suggest an atypical nature.  If her CTA shows mild obstructive disease was normal then I think she should start taking her omeprazole on a daily basis for look for other etiologies of chest pain that are noncardiac.  I will keep follow-up with me open ended.  Lower extremity edema I will obtain an echocardiogram to evaluate further and peripheral edema due to a cardiac etiology is likely bilateral in nature.     Shared Decision Making/Informed Consent:       Medication Adjustments/Labs and Tests Ordered: Current medicines are reviewed at length with the patient today.  Concerns regarding medicines are outlined above.   No orders of the defined types were placed in this encounter.   No orders of the defined types were placed in this encounter.   Patient  Instructions  Medication Instructions:  No changes *If you need a refill on your cardiac medications before your next appointment, please call your pharmacy*   Lab Work: Bmet today If you have labs (blood work) drawn today and your tests are completely normal, you will receive your results only by: [place] (if you have MyChart) OR A paper copy in the mail If you have any lab test that is abnormal or we need to change your treatment, we will call you to review the results.   Testing/Procedures: Your physician has requested that you have an echocardiogram. Echocardiography is a painless test that uses sound waves to create images of your heart. It provides your doctor with information about the size and shape of your heart and how well your heart's chambers and valves are working. This procedure takes approximately one hour. There are no restrictions for this procedure.  Cardiac CT - see instructions below.   Follow-Up: As needed.   :1}    Other Instructions   Your cardiac CT will be scheduled at one of the below locations:   [facility] [address] [place], [place] [address] [phone]  [place] [address] [place], [place] [address] [phone]  If scheduled at [facility], please arrive at the [facility] main entrance (entrance A) of [facility] 30 minutes prior to test start time. You can use the FREE valet parking offered at the main entrance (encouraged to control the heart rate for the test) Proceed to the [facility] Radiology Department (first floor) to check-in and test prep.  If scheduled at [facility], please arrive 15 mins early for check-in and test prep.  Please follow these instructions carefully (unless otherwise directed):  Hold all erectile dysfunction medications at least 3 days (72 hrs) prior to test.  On the Night Before the Test: Be sure to Drink plenty of water. Do not consume any caffeinated/decaffeinated beverages or chocolate 12 hours prior to your test. Do not take any antihistamines 12 hours prior to your test. If the patient has contrast allergy: Patient will need a prescription for Prednisone and very clear instructions (as follows): Prednisone 50 mg - take 13 hours prior to test Take another Prednisone 50 mg 7 hours prior to test Take another Prednisone 50 mg 1 hour prior to test Take Benadryl 50 mg 1 hour prior to test Patient must complete all four doses of above prophylactic medications. Patient will need a ride after test due to Benadryl.  On the Day of the Test: Drink plenty of water until 1 hour prior to the test. Do not eat any food 4 hours prior to the test. You may take your regular medications prior to the test.  Take metoprolol (Lopressor) two hours prior to test. HOLD Furosemide/Hydrochlorothiazide morning of the test. FEMALES- please wear underwire-free bra if available, avoid dresses & tight clothing   *For Clinical Staff only. Please instruct patient the  following:* Heart Rate Medication Recommendations for Cardiac CT  Resting HR < 50 bpm  No medication  Resting HR 50-60 bpm and BP >110/50 mmHG   Consider Metoprolol tartrate 25 mg PO 90-120 min prior to scan  Resting HR 60-65 bpm and BP >110/50 mmHG  Metoprolol tartrate 50 mg PO 90-120 minutes prior to scan   Resting HR > 65 bpm and BP >110/50 mmHG  Metoprolol tartrate 100 mg PO 90-120 minutes prior to scan  Consider Ivabradine 10-15 mg PO or a calcium channel blocker for resting HR >60 bpm and contraindication to metoprolol tartrate  Consider Ivabradine 10-15 mg PO in combination with metoprolol tartrate for HR >80 bpm         After the Test: Drink plenty of water. After receiving IV contrast, you may experience a mild flushed feeling. This is normal. On occasion, you may experience a mild rash up to 24 hours after the test. This is not dangerous. If this occurs, you can take Benadryl 25 mg and increase your fluid intake. If you experience trouble breathing, this can be serious. If it is severe call 911 IMMEDIATELY. If it is mild, please call our office. If you take any of these medications: Glipizide/Metformin, Avandament, Glucavance, please do not take 48 hours after completing test unless otherwise instructed.  Please allow 2-4 weeks for scheduling of routine cardiac CTs. Some insurance companies require a pre-authorization which may delay scheduling of this test.   For non-scheduling related questions, please contact the cardiac imaging nurse navigator should you have any questions/concerns: Marchia Bond, Cardiac Imaging Nurse Navigator Gordy Clement, Cardiac Imaging Nurse Navigator [facility] Direct Office Dial: [phone]   For scheduling needs, including cancellations and rescheduling, please call [place], [phone].

## 2021-05-31 ENCOUNTER — Encounter: Payer: Self-pay | Admitting: Internal Medicine

## 2021-05-31 ENCOUNTER — Ambulatory Visit: Payer: Commercial Managed Care - PPO | Admitting: Internal Medicine

## 2021-05-31 ENCOUNTER — Other Ambulatory Visit: Payer: Self-pay

## 2021-05-31 VITALS — BP 112/80 | HR 84 | Ht 65.0 in | Wt 247.0 lb

## 2021-05-31 DIAGNOSIS — E785 Hyperlipidemia, unspecified: Secondary | ICD-10-CM | POA: Diagnosis not present

## 2021-05-31 DIAGNOSIS — R072 Precordial pain: Secondary | ICD-10-CM | POA: Diagnosis not present

## 2021-05-31 DIAGNOSIS — R079 Chest pain, unspecified: Secondary | ICD-10-CM

## 2021-05-31 DIAGNOSIS — R600 Localized edema: Secondary | ICD-10-CM | POA: Diagnosis not present

## 2021-05-31 MED ORDER — PREDNISONE 50 MG PO TABS: ORAL_TABLET | ORAL | 0 refills | Status: DC

## 2021-05-31 MED ORDER — FAMOTIDINE 20 MG PO TABS: ORAL_TABLET | ORAL | 0 refills | Status: DC

## 2021-05-31 MED ORDER — METOPROLOL TARTRATE 100 MG PO TABS: 100.0000 mg | ORAL_TABLET | Freq: Once | ORAL | 0 refills | Status: DC

## 2021-05-31 NOTE — Patient Instructions (Addendum)
Medication Instructions:  No changes *If you need a refill on your cardiac medications before your next appointment, please call your pharmacy*   Lab Work: Bmet today If you have labs (blood work) drawn today and your tests are completely normal, you will receive your results only by: [place] (if you have MyChart) OR A paper copy in the mail If you have any lab test that is abnormal or we need to change your treatment, we will call you to review the results.   Testing/Procedures: Your physician has requested that you have an echocardiogram. Echocardiography is a painless test that uses sound waves to create images of your heart. It provides your doctor with information about the size and shape of your heart and how well your heart's chambers and valves are working. This procedure takes approximately one hour. There are no restrictions for this procedure.  Cardiac CT - see instructions below.   Follow-Up: As needed.   :1}    Other Instructions   Your cardiac CT will be scheduled at  [facility] [place], [place] [address] [phone]   Please arrive at the [facility] main entrance (entrance A) of [facility] 30 minutes prior to test start time. You can use the FREE valet parking offered at the main entrance (encouraged to control the heart rate for the test) Proceed to the [facility] Radiology Department (first floor) to check-in and test prep.  Please follow these instructions carefully (unless otherwise directed):   On the Night Before the Test: Be sure to Drink plenty of water. Do not consume any caffeinated/decaffeinated beverages or chocolate 12 hours prior to your test. Do not take any antihistamines 12 hours prior to your test.  On the Day of the Test: Drink plenty of water until 1 hour prior to the test. Do not eat any food 4 hours prior to the test. You may take your regular medications prior to the test.  Take  metoprolol (Lopressor) two hours prior to test. HOLD Furosemide morning of the test. FEMALES- please wear underwire-free bra if available, avoid dresses & tight clothing  FOR CONTRAST ALLERGY: Prednisone 50 mg - take 13 hours prior to test Take another Prednisone 50 mg 7 hours prior to test Take another Prednisone 50 mg 1 hour prior to test Take Pepcid 20 mg 1 hour prior to test      After the Test: Drink plenty of water. After receiving IV contrast, you may experience a mild flushed feeling. This is normal. On occasion, you may experience a mild rash up to 24 hours after the test. This is not dangerous. If this occurs, you can take Benadryl 25 mg and increase your fluid intake. If you experience trouble breathing, this can be serious. If it is severe call 911 IMMEDIATELY. If it is mild, please call our office. If you take any of these medications: Glipizide/Metformin, Avandament, Glucavance, please do not take 48 hours after completing test unless otherwise instructed.  Please allow 2-4 weeks for scheduling of routine cardiac CTs. Some insurance companies require a pre-authorization which may delay scheduling of this test.   For non-scheduling related questions, please contact the cardiac imaging nurse navigator should you have any questions/concerns: Marchia Bond, Cardiac Imaging Nurse Navigator Gordy Clement, Cardiac Imaging Nurse Navigator [facility] Direct Office Dial: [phone]   For scheduling needs, including cancellations and rescheduling, please call [place], [phone].

## 2021-06-01 LAB — BASIC METABOLIC PANEL
BUN/Creatinine Ratio: 7 — ABNORMAL LOW (ref 9–23)
BUN: 6 mg/dL (ref 6–24)
CO2: 21 mmol/L (ref 20–29)
Calcium: 9.1 mg/dL (ref 8.7–10.2)
Chloride: 104 mmol/L (ref 96–106)
Creatinine, Ser: 0.9 mg/dL (ref 0.57–1.00)
Glucose: 85 mg/dL (ref 70–99)
Potassium: 3.9 mmol/L (ref 3.5–5.2)
Sodium: 141 mmol/L (ref 134–144)
eGFR: 78 mL/min/{1.73_m2} (ref >59)

## 2021-06-07 ENCOUNTER — Other Ambulatory Visit: Payer: Self-pay | Admitting: Medical

## 2021-06-11 ENCOUNTER — Other Ambulatory Visit (HOSPITAL_COMMUNITY): Payer: Self-pay | Admitting: *Deleted

## 2021-06-11 ENCOUNTER — Telehealth (HOSPITAL_COMMUNITY): Payer: Self-pay | Admitting: *Deleted

## 2021-06-11 MED ORDER — HYDROXYZINE PAMOATE 50 MG PO CAPS: ORAL_CAPSULE | ORAL | 0 refills | Status: DC

## 2021-06-11 NOTE — Telephone Encounter (Signed)
Reaching out to patient to offer assistance regarding upcoming cardiac imaging study; pt verbalizes understanding of appt date/time, parking situation and where to check in, pre-test NPO status and medications ordered, and verified current allergies; name and call back number provided for further questions should they arise  Gordy Clement RN Navigator Cardiac [place] [facility] [phone] office [phone] cell  Patient verbalized how to take 13 hour prep.  Due to patient's allergy to benadryl, pt will take 20mg  pepcid and 50mg  vistaril instead 1 hour prior to cardiac CT. This change in protocol was discussed with Dr. Ali Lowe (Cardiologist) and Dr. Vernard Gambles (Radiologist) and prescriptions were sent to pharmacy in addition to the prednisone.  Pt will take 100mg  metoprolol tartrate two hours prior to cardiac CT. She was informed to arrive at 8:30am for her 9am scan.

## 2021-06-12 ENCOUNTER — Other Ambulatory Visit: Payer: Self-pay

## 2021-06-12 ENCOUNTER — Ambulatory Visit (HOSPITAL_COMMUNITY)
Admission: RE | Admit: 2021-06-12 | Discharge: 2021-06-12 | Disposition: A | Discharge status: Home / Self Care | Payer: Commercial Managed Care - PPO | Source: Ambulatory Visit | Attending: Internal Medicine | Admitting: Internal Medicine

## 2021-06-12 DIAGNOSIS — E785 Hyperlipidemia, unspecified: Secondary | ICD-10-CM | POA: Diagnosis present

## 2021-06-12 DIAGNOSIS — R072 Precordial pain: Secondary | ICD-10-CM | POA: Insufficient documentation

## 2021-06-12 DIAGNOSIS — R079 Chest pain, unspecified: Secondary | ICD-10-CM

## 2021-06-12 DIAGNOSIS — R600 Localized edema: Secondary | ICD-10-CM | POA: Diagnosis present

## 2021-06-12 MED ORDER — DILTIAZEM HCL 25 MG/5ML IV SOLN
10.0000 mg | INTRAVENOUS | Status: AC | PRN
  Administered 2021-06-12: 10 mg via INTRAVENOUS

## 2021-06-12 MED ORDER — NITROGLYCERIN 0.4 MG SL SUBL
SUBLINGUAL_TABLET | SUBLINGUAL | Status: AC
  Administered 2021-06-12: 0.8 mg via SUBLINGUAL
  Filled 2021-06-12: qty 2

## 2021-06-12 MED ORDER — IOHEXOL 350 MG/ML SOLN
95.0000 mL | Freq: Once | INTRAVENOUS | Status: AC | PRN
  Administered 2021-06-12: 95 mL via INTRAVENOUS

## 2021-06-12 MED ORDER — METOPROLOL TARTRATE 5 MG/5ML IV SOLN
INTRAVENOUS | Status: AC
  Administered 2021-06-12: 5 mg via INTRAVENOUS
  Filled 2021-06-12: qty 20

## 2021-06-12 MED ORDER — NITROGLYCERIN 0.4 MG SL SUBL: 0.8000 mg | SUBLINGUAL_TABLET | Freq: Once | SUBLINGUAL | Status: AC

## 2021-06-12 MED ORDER — DILTIAZEM HCL 25 MG/5ML IV SOLN
INTRAVENOUS | Status: AC
  Administered 2021-06-12: 10 mg via INTRAVENOUS
  Filled 2021-06-12: qty 5

## 2021-06-12 MED ORDER — METOPROLOL TARTRATE 5 MG/5ML IV SOLN
10.0000 mg | INTRAVENOUS | Status: AC | PRN
  Administered 2021-06-12: 10 mg via INTRAVENOUS

## 2021-06-13 ENCOUNTER — Encounter: Payer: Self-pay | Admitting: Internal Medicine

## 2021-06-13 DIAGNOSIS — Z860100 Personal history of colon polyps, unspecified: Secondary | ICD-10-CM | POA: Insufficient documentation

## 2021-06-20 ENCOUNTER — Ambulatory Visit (INDEPENDENT_AMBULATORY_CARE_PROVIDER_SITE_OTHER): Payer: Commercial Managed Care - PPO | Admitting: Medical

## 2021-06-20 VITALS — BP 110/68 | HR 93 | Temp 98.2°F | Resp 16 | Wt 249.2 lb

## 2021-06-20 DIAGNOSIS — I10 Essential (primary) hypertension: Secondary | ICD-10-CM | POA: Diagnosis not present

## 2021-06-20 DIAGNOSIS — E039 Hypothyroidism, unspecified: Secondary | ICD-10-CM | POA: Diagnosis not present

## 2021-06-20 DIAGNOSIS — M7989 Other specified soft tissue disorders: Secondary | ICD-10-CM | POA: Diagnosis not present

## 2021-06-20 DIAGNOSIS — D126 Benign neoplasm of colon, unspecified: Secondary | ICD-10-CM

## 2021-06-20 DIAGNOSIS — Z6841 Body Mass Index (BMI) 40.0 and over, adult: Secondary | ICD-10-CM

## 2021-06-20 NOTE — Progress Notes (Signed)
Subjective:  Rebecca Soto is a 50 y.o. female who presents for Chief Complaint  Patient presents with   swelling in leg    Swelling in leg in the shin   Here for concern for leg swelling.   For several months now she is complaining of leg swelling.  She says it can be a little worse than what she has today.  It often is better in the morning when she gets up.  She sits most of the day at work.  She denies any chest pain or shortness of breath.  No numbness or tingling in the legs.  She is still currently taking Klor-Con and Lasix daily but does not think he is doing very much.  She is exercising  She is trying to lose weight through healthy diet exercise and with the help of Ozempic injection.  She is having trouble finding the 1 mg dose though at local pharmacy  She recently had a colonoscopy.  Given the tubular adenoma polyp she was advised a repeat colonoscopy next year 2023   No other aggravating or relieving factors.    No other c/o.  Past Medical History:  Diagnosis Date   Allergy    Asthma    Goiter    Herpes labialis 08/22/2017   Hypertension 2019   Current Outpatient Medications on File Prior to Visit  Medication Sig Dispense Refill   albuterol (VENTOLIN HFA) 108 (90 Base) MCG/ACT inhaler TAKE 2 PUFFS BY MOUTH EVERY 6 HOURS AS NEEDED FOR WHEEZE OR SHORTNESS OF BREATH 6.7 each 0   amLODipine (NORVASC) 5 MG tablet Take 1 tablet (5 mg total) by mouth daily. 90 tablet 3   budesonide-formoterol (SYMBICORT) 160-4.5 MCG/ACT inhaler TAKE 2 PUFFS BY MOUTH TWICE A DAY 30.6 each 3   cholecalciferol (VITAMIN D3) 25 MCG (1000 UNIT) tablet Take 1 tablet (1,000 Units total) by mouth daily. 90 tablet 3   levothyroxine (SYNTHROID) 50 MCG tablet Take 1 tablet (50 mcg total) by mouth daily. 30 tablet 11   montelukast (SINGULAIR) 10 MG tablet TAKE 1 TABLET BY MOUTH EVERYDAY AT BEDTIME 90 tablet 1   rosuvastatin (CRESTOR) 10 MG tablet Take 1 tablet (10 mg total) by mouth daily. 90  tablet 3   Semaglutide,0.25 or 0.5MG /DOS, (OZEMPIC, 0.25 OR 0.5 MG/DOSE,) 2 MG/1.5ML SOPN Inject into the skin once a week.     valACYclovir (VALTREX) 1000 MG tablet Take 2,000 mg by mouth q12h x 1 day. Start ASAP at onset of symptoms. 30 tablet 2   No current facility-administered medications on file prior to visit.     The following portions of the patient's history were reviewed and updated as appropriate: allergies, current medications, past family history, past medical history, past social history, past surgical history and problem list.  ROS Otherwise as in subjective above    Objective: BP 110/68   Pulse 93   Temp 98.2 F (36.8 C)   Resp 16   Wt 249 lb 3.2 oz (113 kg)   SpO2 98%   BMI 41.47 kg/m   General appearance: alert, no distress, well developed, well nourished Neck: supple, no lymphadenopathy, no thyromegaly, no masses, no JVD or bruit Heart: RRR, normal S1, S2, no murmurs Lungs: CTA bilaterally, no wheezes, rhonchi, or rales Pulses: 2+ radial pulses, 2+ pedal pulses, normal cap refill Ext: Upper legs nontender no swelling, lower legs in general with hyperlipidemia, possible nonpitting edema uniform from venous insufficiency but otherwise no frank edema.  There is some puffiness  over bilateral lateral ankles anteriorly.     Assessment: Encounter Diagnoses  Name Primary?   Leg swelling Yes   Essential hypertension, benign    BMI 40.0-44.9, adult (HCC)    Hypothyroidism, unspecified type    Adenomatous polyp of colon, unspecified part of colon      Plan: Leg swelling, BMI greater than 40 Her main concern today leg swelling.  She recently saw cardiology.  They noted on exam no edema.  Similarly today there is maybe some puffiness of the ankles likely due to amlodipine.  There seems to be more of a little lipidemia and venous insufficiency situation compared to anything else.  No frank edema today.  We discussed that her kidneys are normal we have already  screened that recently.  She has an upcoming echocardiogram which will likely be normal as she has no signs of CHF.  I suspect this is a combination of obesity, hyperlipidemia, venous insufficiency.  Advise continue exercise, continue with compression socks daily, continue efforts to lose weight through healthy lifestyle.  Stop Lasix and Klor-Con as they appear to be doing nothing.  States she still is convinced that she has some significant swelling I am going to refer her to interventional radiology for a venous study to look at her legs to build to rule out any other issues.  Obesity - we discussed a potential available weight loss study.  I will refer to [facility] weight loss study to help in our efforts.  In the meantime if she finds the Ozempic available at a different pharmacy she will let me know so I can send a prescription to a different pharmacy  Hypertension-continue amlodipine 5 mg daily.  She would really like to get off of this.  I encouraged her to lose weight and continue with healthy lifestyle changes to be able to possibly come off medication  I reviewed her recent cardiology notes.  She has an echocardiogram planned.  She had a recent CT coronary score of 0.  Hypothyroidism-continue current medication  I reviewed her recent colonoscopy report.  She is due back for repeat colonoscopy next year  Arrielle was seen today for swelling in leg.  Diagnoses and all orders for this visit:  Leg swelling -     Ambulatory referral to Interventional Radiology  Essential hypertension, benign -     Ambulatory referral to Interventional Radiology  BMI 40.0-44.9, adult [facility]) -     Ambulatory referral to Interventional Radiology  Hypothyroidism, unspecified type  Adenomatous polyp of colon, unspecified part of colon   Follow up: pending referrals

## 2021-06-22 ENCOUNTER — Other Ambulatory Visit: Payer: Self-pay

## 2021-06-22 ENCOUNTER — Ambulatory Visit (HOSPITAL_COMMUNITY): Payer: Commercial Managed Care - PPO | Attending: Internal Medicine

## 2021-06-22 DIAGNOSIS — R072 Precordial pain: Secondary | ICD-10-CM | POA: Diagnosis not present

## 2021-06-22 DIAGNOSIS — E785 Hyperlipidemia, unspecified: Secondary | ICD-10-CM | POA: Diagnosis not present

## 2021-06-22 DIAGNOSIS — R600 Localized edema: Secondary | ICD-10-CM | POA: Insufficient documentation

## 2021-06-22 DIAGNOSIS — R079 Chest pain, unspecified: Secondary | ICD-10-CM

## 2021-06-22 LAB — ECHOCARDIOGRAM COMPLETE
Area-P 1/2: 4.44 cm2
S' Lateral: 3.2 cm

## 2021-07-01 ENCOUNTER — Other Ambulatory Visit: Payer: Self-pay | Admitting: Medical

## 2021-07-13 ENCOUNTER — Other Ambulatory Visit: Payer: Self-pay | Admitting: Medical

## 2021-07-23 DIAGNOSIS — E782 Mixed hyperlipidemia: Secondary | ICD-10-CM | POA: Insufficient documentation

## 2021-07-23 DIAGNOSIS — E063 Autoimmune thyroiditis: Secondary | ICD-10-CM | POA: Insufficient documentation

## 2021-09-23 ENCOUNTER — Other Ambulatory Visit: Payer: Self-pay | Admitting: Medical

## 2021-10-10 ENCOUNTER — Encounter: Payer: Self-pay | Admitting: Internal Medicine

## 2021-10-31 ENCOUNTER — Other Ambulatory Visit: Payer: Self-pay | Admitting: Medical

## 2021-11-10 ENCOUNTER — Ambulatory Visit (HOSPITAL_COMMUNITY)
Admission: EM | Admit: 2021-11-10 | Discharge: 2021-11-10 | Disposition: A | Discharge status: Home / Self Care | Payer: Commercial Managed Care - PPO | Attending: Family Medicine | Admitting: Family Medicine

## 2021-11-10 ENCOUNTER — Encounter (HOSPITAL_COMMUNITY): Payer: Self-pay | Admitting: *Deleted

## 2021-11-10 ENCOUNTER — Other Ambulatory Visit: Payer: Self-pay

## 2021-11-10 ENCOUNTER — Ambulatory Visit (INDEPENDENT_AMBULATORY_CARE_PROVIDER_SITE_OTHER): Payer: Commercial Managed Care - PPO

## 2021-11-10 DIAGNOSIS — R0602 Shortness of breath: Secondary | ICD-10-CM | POA: Diagnosis not present

## 2021-11-10 DIAGNOSIS — R079 Chest pain, unspecified: Secondary | ICD-10-CM

## 2021-11-10 DIAGNOSIS — J4521 Mild intermittent asthma with (acute) exacerbation: Secondary | ICD-10-CM

## 2021-11-10 MED ORDER — PREDNISONE 10 MG (48) PO TBPK: ORAL_TABLET | ORAL | 0 refills | Status: DC

## 2021-11-10 NOTE — ED Triage Notes (Addendum)
Pt reports CP for one week. Pt reports pain has improve. Pt reports going to [place] last Sunday and has finished Prednisone . Pt also has been using the neb treatments along with HHN. ?

## 2021-11-12 NOTE — ED Provider Notes (Signed)
?[place] ? ? ?[id number] ?11/10/21 Arrival Time: 9702 ? ?ASSESSMENT & PLAN: ? ?1. Mild intermittent asthma with acute exacerbation   ?2. Chest pain, unspecified type   ?3. SOB (shortness of breath)   ? ? ?ECG: ?Performed today and interpreted by me: Sinus; no acute changes; no STEMI when compared to 10/22 ECG. ? ?I have personally viewed the imaging studies ordered this visit. ?CXR: without acute changes; no signs of PNA. ? ?Begin: ?Meds ordered this encounter  ?Medications  ? predniSONE (STERAPRED UNI-PAK 48 TAB) 10 MG (48) TBPK tablet  ?  Sig: Take as directed.  ?  Dispense:  48 tablet  ?  Refill:  0  ?Has inhalers at home. ? ?Reviewed expectations re: course of current medical issues. Questions answered. ?Outlined signs and symptoms indicating need for more acute intervention. ?Patient verbalized understanding. ?After Visit Summary given. ? ? ?SUBJECTIVE: ? ?History from: patient. ?Rebecca Soto is a 51 y.o. female who presents with complaint of CP x one week. Was seen at outside [facility]; given short trial of presnisone; helped some but still wheezing. Mild SOB at times and with exertion. No assoc n/v/diaphoresis. Neb treatments provide temp relief. ? ? ?Social History  ? ?Tobacco Use  ?Smoking Status Never  ?Smokeless Tobacco Never  ? ?Social History  ? ?Substance and Sexual Activity  ?Alcohol Use Yes  ? Alcohol/week: 2.0 standard drinks  ? Types: 2 Glasses of wine per week  ? ? ?OBJECTIVE: ? ?Vitals:  ? 11/10/21 1720  ?BP: 130/81  ?Pulse: 85  ?Resp: 18  ?Temp: 98.2 ?F (36.8 ?C)  ?SpO2: 99%  ?  ?General appearance: alert, oriented, no acute distress ?Eyes: PERRLA; EOMI; conjunctivae normal ?HENT: normocephalic; atraumatic ?Neck: supple with FROM ?Lungs: without labored respirations; speaks full sentences without difficulty; mild bilateral wheezing present; dry cough ?Heart: regular ?Chest Wall: without tenderness to palpation ?Abdomen: soft, non-tender; no guarding or rebound  tenderness ?Extremities: without edema; without calf swelling or tenderness; symmetrical without gross deformities ?Skin: warm and dry; without rash or lesions ?Neuro: normal gait ?Psychological: alert and cooperative; normal mood and affect ? ?Imaging: ?DG Chest 2 View ? ?Result Date: 11/10/2021 ?CLINICAL DATA:  Shortness of breath.  Chest pain for 1 week. EXAM: CHEST - 2 VIEW COMPARISON:  Chest two views 01/29/2021 FINDINGS: Cardiac silhouette and mediastinal contours are within normal limits. The lungs are clear. No pleural effusion or pneumothorax. No acute skeletal abnormality. IMPRESSION: No active cardiopulmonary disease. Electronically Signed   By: Yvonne Kendall M.D.   On: 11/10/2021 17:54   ? ? ?Allergies  ?Allergen Reactions  ? Chlorpheniramine Itching  ? Diphenhydramine Hcl Other (See Comments)  ?  Unknown  ? Iodinated Contrast Media Hives  ?  Other reaction(s): Unknown  ? Other Anaphylaxis and Hives  ?  Artichokes ?IV contrast/dye  ? Aspirin Other (See Comments)  ?  Dry throat and coughing  ?Other reaction(s): Unknown  ? Diphenhydramine   ?  Other reaction(s): Unknown  ? Iohexol Hives  ? Latex   ?  Other reaction(s): Unknown  ? ? ?Past Medical History:  ?Diagnosis Date  ? Allergy   ? Asthma   ? Goiter   ? Herpes labialis 08/22/2017  ? Hypertension 2019  ? ?Social History  ? ?Socioeconomic History  ? Marital status: Single  ?  Spouse name: Not on file  ? Number of children: Not on file  ? Years of education: Not on file  ? Highest education level: Not on file  ?  Occupational History  ? Not on file  ?Tobacco Use  ? Smoking status: Never  ? Smokeless tobacco: Never  ?Vaping Use  ? Vaping Use: Never used  ?Substance and Sexual Activity  ? Alcohol use: Yes  ?  Alcohol/week: 2.0 standard drinks  ?  Types: 2 Glasses of wine per week  ? Drug use: No  ? Sexual activity: Yes  ?  Birth control/protection: Surgical  ?Other Topics Concern  ? Not on file  ?Social History Narrative  ? Lives with mom and son.  Has 1  boyfriend times 1 year.  Works as Marine scientist, [employer]  Exercise - walking.  11/2020  ? ?Social Determinants of Health  ? ?Financial Resource Strain: Not on file  ?Food Insecurity: Not on file  ?Transportation Needs: Not on file  ?Physical Activity: Not on file  ?Stress: Not on file  ?Social Connections: Not on file  ?Intimate Partner Violence: Not on file  ? ?Family History  ?Problem Relation Age of Onset  ? Cancer Other   ? Hypertension Other   ? Hyperlipidemia Other   ? Hypertension Mother   ? Heart disease Mother   ?     cardiomegaly  ? Other Mother   ?     brain tumor  ? Aneurysm Mother   ?     brain  ? Hypertension Father   ? Cancer Father   ?     throat  ? Hypertension Brother   ? Diabetes Brother   ? Diabetes Maternal Grandmother   ? Hypertension Maternal Grandmother   ? Hypertension Maternal Grandfather   ? Hypertension Sister   ? Hypertension Paternal Grandmother   ? Hypertension Paternal Grandfather   ? Hypertension Sister   ? Diabetes Brother   ? Hypertension Brother   ? Diabetes Brother   ? Hypertension Brother   ? Diabetes Brother   ? Hypertension Brother   ? Diabetes Brother   ? Hypertension Brother   ? Diabetes Brother   ? Hypertension Brother   ? Hypertension Brother   ? ?Past Surgical History:  ?Procedure Laterality Date  ? ABDOMINAL HYSTERECTOMY  2008  ? still has ovaries, fibroids  ? TOTAL ABDOMINAL HYSTERECTOMY  2008  ? ? ?  ?Vanessa Kick, MD ?11/12/21 [phone] ? ?

## 2021-12-17 ENCOUNTER — Other Ambulatory Visit: Payer: Self-pay | Admitting: Medical

## 2021-12-24 ENCOUNTER — Other Ambulatory Visit: Payer: Self-pay | Admitting: Medical

## 2021-12-25 ENCOUNTER — Other Ambulatory Visit: Payer: Self-pay | Admitting: Medical

## 2021-12-25 DIAGNOSIS — Z1231 Encounter for screening mammogram for malignant neoplasm of breast: Secondary | ICD-10-CM

## 2022-01-18 ENCOUNTER — Ambulatory Visit: Payer: Commercial Managed Care - PPO

## 2022-01-20 ENCOUNTER — Other Ambulatory Visit: Payer: Self-pay | Admitting: Medical

## 2022-01-21 MED ORDER — BUDESONIDE-FORMOTEROL FUMARATE 160-4.5 MCG/ACT IN AERO: INHALATION_SPRAY | RESPIRATORY_TRACT | 0 refills | Status: DC

## 2022-02-24 ENCOUNTER — Other Ambulatory Visit: Payer: Self-pay | Admitting: Medical

## 2022-02-25 NOTE — Telephone Encounter (Signed)
Sent pt a mychart message that appt is needed.

## 2022-02-28 NOTE — Telephone Encounter (Signed)
Pt was called to advise of the need for an appointment. No answer and no reply to previous message. [place]

## 2022-03-14 ENCOUNTER — Other Ambulatory Visit: Payer: Self-pay | Admitting: Medical

## 2022-03-15 ENCOUNTER — Other Ambulatory Visit: Payer: Self-pay | Admitting: Medical

## 2022-03-15 DIAGNOSIS — B001 Herpesviral vesicular dermatitis: Secondary | ICD-10-CM

## 2022-03-15 NOTE — Telephone Encounter (Signed)
Pt is due for an and I have sent her a message through another refill request that came in.

## 2022-03-15 NOTE — Telephone Encounter (Signed)
Pt scheduled for an appt in September. Pt is out of meds and needs refill

## 2022-03-15 NOTE — Telephone Encounter (Signed)
Pt scheduled an appt in September. Pt is out of meds

## 2022-03-25 ENCOUNTER — Other Ambulatory Visit: Payer: Self-pay | Admitting: Medical

## 2022-03-25 DIAGNOSIS — B001 Herpesviral vesicular dermatitis: Secondary | ICD-10-CM

## 2022-03-26 NOTE — Telephone Encounter (Signed)
Pt has upcoming appointment in 2 days

## 2022-03-27 ENCOUNTER — Encounter: Payer: Self-pay | Admitting: Internal Medicine

## 2022-03-28 ENCOUNTER — Ambulatory Visit (INDEPENDENT_AMBULATORY_CARE_PROVIDER_SITE_OTHER): Payer: Commercial Managed Care - PPO | Admitting: Medical

## 2022-03-28 VITALS — BP 122/80 | HR 81 | Wt 243.8 lb

## 2022-03-28 DIAGNOSIS — E039 Hypothyroidism, unspecified: Secondary | ICD-10-CM

## 2022-03-28 DIAGNOSIS — I10 Essential (primary) hypertension: Secondary | ICD-10-CM | POA: Diagnosis not present

## 2022-03-28 DIAGNOSIS — Z23 Encounter for immunization: Secondary | ICD-10-CM | POA: Diagnosis not present

## 2022-03-28 DIAGNOSIS — Z6841 Body Mass Index (BMI) 40.0 and over, adult: Secondary | ICD-10-CM

## 2022-03-28 DIAGNOSIS — K5900 Constipation, unspecified: Secondary | ICD-10-CM

## 2022-03-28 DIAGNOSIS — E785 Hyperlipidemia, unspecified: Secondary | ICD-10-CM

## 2022-03-28 DIAGNOSIS — J4540 Moderate persistent asthma, uncomplicated: Secondary | ICD-10-CM

## 2022-03-28 DIAGNOSIS — E559 Vitamin D deficiency, unspecified: Secondary | ICD-10-CM

## 2022-03-28 MED ORDER — WEGOVY 0.25 MG/0.5ML ~~LOC~~ SOAJ: 0.2500 mg | SUBCUTANEOUS | 0 refills | Status: DC

## 2022-03-28 NOTE — Progress Notes (Signed)
Subjective:  Rebecca Soto is a 51 y.o. female who presents for Chief Complaint  Patient presents with   fasting med check     Fasting med check, no concerns, flu shot      Here for fasting med check.    Wants flu shot today  Has lost weight in recent months. Is walking a lot, has cut out soda.  Is using zero soda, not regular.  Drinking more water.   Going to the gym 2 days per week.   Back last year we had a statin.  She has not been back in since then.  She is taking the medicine without complaint.  Fasting today  Last year her vitamin D was quite low.  She is taking vitamin D supplement  Last visit last year we modified her thyroid dose.  She is compliant with medication without complaint  No other aggravating or relieving factors.    No other c/o.  Past Medical History:  Diagnosis Date   Allergy    Asthma    Goiter    Herpes labialis 08/22/2017   Hypertension 2019   Current Outpatient Medications on File Prior to Visit  Medication Sig Dispense Refill   albuterol (VENTOLIN HFA) 108 (90 Base) MCG/ACT inhaler TAKE 2 PUFFS BY MOUTH EVERY 6 HOURS AS NEEDED FOR WHEEZE OR SHORTNESS OF BREATH 6.7 each 0   amLODipine (NORVASC) 5 MG tablet TAKE 1 TABLET (5 MG TOTAL) BY MOUTH DAILY. 30 tablet 0   budesonide-formoterol (SYMBICORT) 160-4.5 MCG/ACT inhaler TAKE 2 PUFFS BY MOUTH TWICE A DAY 30.6 each 0   cholecalciferol (VITAMIN D) 25 MCG (1000 UNIT) tablet TAKE 1 TABLET BY MOUTH EVERY DAY 90 tablet 0   levothyroxine (SYNTHROID) 50 MCG tablet TAKE 1 TABLET BY MOUTH EVERY DAY 30 tablet 0   montelukast (SINGULAIR) 10 MG tablet TAKE 1 TABLET BY MOUTH EVERYDAY AT BEDTIME 90 tablet 1   rosuvastatin (CRESTOR) 10 MG tablet TAKE 1 TABLET BY MOUTH EVERY DAY 30 tablet 0   valACYclovir (VALTREX) 1000 MG tablet TAKE 2 TABLETS BY MOUTH EVERY 12HOURS X 1 DAY. START ASAP AT ONSET OF SYMPTOMS. 30 tablet 0   No current facility-administered medications on file prior to visit.     The  following portions of the patient's history were reviewed and updated as appropriate: allergies, current medications, past family history, past medical history, past social history, past surgical history and problem list.  ROS Otherwise as in subjective above  Objective: BP 122/80   Pulse 81   Wt 243 lb 12.8 oz (110.6 kg)   BMI 40.57 kg/m   Wt Readings from Last 3 Encounters:  03/28/22 243 lb 12.8 oz (110.6 kg)  06/20/21 249 lb 3.2 oz (113 kg)  05/31/21 247 lb (112 kg)   General appearance: alert, no distress, well developed, well nourished Neck: supple, no lymphadenopathy, no thyromegaly, no masses Heart: RRR, normal S1, S2, no murmurs Lungs: CTA bilaterally, no wheezes, rhonchi, or rales Pulses: 2+ radial pulses, 2+ pedal pulses, normal cap refill Ext: no edema   Assessment: Encounter Diagnoses  Name Primary?   Hypothyroidism, unspecified type Yes   Needs flu shot    Moderate persistent asthma without complication    Essential hypertension, benign    BMI 40.0-44.9, adult (HCC)    Constipation, unspecified constipation type    Hyperlipidemia, unspecified hyperlipidemia type    Vitamin D deficiency      Plan: Congratulated her on her weight loss efforts.  Continue efforts  to lose more weight through healthy diet and exercise.  We discussed her exercise, she wants to go back on medication.  She was on Ozempic in the past but insurance probably does not cover that now so we will try Wegovy.  Discussed proper use of medication.  Follow-up in 6- 8 weeks after starting medication  Hypothyroidism-updated labs today, continue current medication  Asthma-continue current therapies Symbicort regularly, albuterol as needed, no current concerns  Hypertension-at goal, continue amlodipine 5 mg daily  Counseled on the influenza virus vaccine.  Vaccine information sheet given.  Influenza vaccine given after consent obtained.  Vitamin D deficiency-labs today, continue vitamin D  supplement  Hyperlipidemia-labs today since starting Crestor last year  Raylea was seen today for fasting med check .  Diagnoses and all orders for this visit:  Hypothyroidism, unspecified type -     TSH + free T4  Needs flu shot -     Flu Vaccine QUAD 36moIM (Fluarix, Fluzone & Alfiuria Quad PF)  Moderate persistent asthma without complication  Essential hypertension, benign  BMI 40.0-44.9, adult (HCC)  Constipation, unspecified constipation type  Hyperlipidemia, unspecified hyperlipidemia type -     Lipid panel -     ALT  Vitamin D deficiency -     VITAMIN D 25 Hydroxy (Vit-D Deficiency, Fractures)  Other orders -     Semaglutide-Weight Management (WEGOVY) 0.25 MG/0.5ML SOAJ; Inject 0.25 mg into the skin once a week.    Follow up: pending labs

## 2022-03-28 NOTE — Patient Instructions (Addendum)
We will start [facility] weekly injection to help with weight loss efforts.  If this is not available or too expensive, call back and let us know.  There is a similar daily injection called Saxenda.  You could call your insurance to see if they cover weight loss medication, and if so, which ones do they cover  Check out Whole 30 eating plan

## 2022-03-29 ENCOUNTER — Other Ambulatory Visit: Payer: Self-pay | Admitting: Medical

## 2022-03-29 DIAGNOSIS — B001 Herpesviral vesicular dermatitis: Secondary | ICD-10-CM

## 2022-03-29 LAB — LIPID PANEL
Chol/HDL Ratio: 1.9 ratio (ref 0.0–4.4)
Cholesterol, Total: 142 mg/dL (ref 100–199)
HDL: 73 mg/dL (ref >39)
LDL Chol Calc (NIH): 58 mg/dL (ref 0–99)
Triglycerides: 50 mg/dL (ref 0–149)
VLDL Cholesterol Cal: 11 mg/dL (ref 5–40)

## 2022-03-29 LAB — VITAMIN D 25 HYDROXY (VIT D DEFICIENCY, FRACTURES): Vit D, 25-Hydroxy: 30.2 ng/mL (ref 30.0–100.0)

## 2022-03-29 LAB — ALT: ALT: 12 IU/L (ref 0–32)

## 2022-03-29 LAB — TSH+FREE T4
Free T4: 1.31 ng/dL (ref 0.82–1.77)
TSH: 0.99 u[IU]/mL (ref 0.450–4.500)

## 2022-03-29 MED ORDER — AMLODIPINE BESYLATE 5 MG PO TABS: 5.0000 mg | ORAL_TABLET | Freq: Every day | ORAL | 3 refills | Status: DC

## 2022-03-29 MED ORDER — ROSUVASTATIN CALCIUM 10 MG PO TABS: 10.0000 mg | ORAL_TABLET | Freq: Every day | ORAL | 3 refills | Status: DC

## 2022-03-29 MED ORDER — LEVOTHYROXINE SODIUM 50 MCG PO TABS: 50.0000 ug | ORAL_TABLET | Freq: Every day | ORAL | 3 refills | Status: DC

## 2022-03-29 MED ORDER — BUDESONIDE-FORMOTEROL FUMARATE 160-4.5 MCG/ACT IN AERO: INHALATION_SPRAY | RESPIRATORY_TRACT | 11 refills | Status: DC

## 2022-03-29 MED ORDER — VITAMIN D 50 MCG (2000 UT) PO CAPS: 1.0000 | ORAL_CAPSULE | Freq: Every day | ORAL | 3 refills | Status: DC

## 2022-03-29 MED ORDER — ALBUTEROL SULFATE HFA 108 (90 BASE) MCG/ACT IN AERS: INHALATION_SPRAY | RESPIRATORY_TRACT | 2 refills | Status: DC

## 2022-03-29 MED ORDER — VALACYCLOVIR HCL 1 G PO TABS: ORAL_TABLET | ORAL | 1 refills | Status: DC

## 2022-04-06 ENCOUNTER — Telehealth: Payer: Self-pay | Admitting: Medical

## 2022-04-06 NOTE — Telephone Encounter (Signed)
P.A. WEGOVY 

## 2022-04-10 NOTE — Telephone Encounter (Signed)
Rebecca Soto has now been approved til 11/07/22. Sent mychart message

## 2022-04-29 ENCOUNTER — Ambulatory Visit
Admission: RE | Admit: 2022-04-29 | Discharge: 2022-04-29 | Disposition: A | Discharge status: Home / Self Care | Payer: Commercial Managed Care - PPO | Source: Ambulatory Visit | Attending: Medical | Admitting: Medical

## 2022-04-29 DIAGNOSIS — Z1231 Encounter for screening mammogram for malignant neoplasm of breast: Secondary | ICD-10-CM

## 2022-04-30 ENCOUNTER — Encounter: Payer: Self-pay | Admitting: Internal Medicine

## 2022-05-09 ENCOUNTER — Encounter: Payer: Self-pay | Admitting: Gastroenterology

## 2022-05-19 ENCOUNTER — Other Ambulatory Visit: Payer: Self-pay | Admitting: Medical

## 2022-05-23 ENCOUNTER — Telehealth: Payer: Self-pay | Admitting: Medical

## 2022-05-23 NOTE — Telephone Encounter (Signed)
Pt called in and stated the pharmacies are out of wegovy and she wanted to know if there was an alternative you can prescribe her.

## 2022-05-24 ENCOUNTER — Telehealth: Payer: Self-pay | Admitting: Medical

## 2022-05-24 ENCOUNTER — Ambulatory Visit: Payer: Commercial Managed Care - PPO | Admitting: Family Medicine

## 2022-05-24 VITALS — BP 122/82 | HR 84 | Wt 249.0 lb

## 2022-05-24 DIAGNOSIS — E049 Nontoxic goiter, unspecified: Secondary | ICD-10-CM | POA: Diagnosis not present

## 2022-05-24 DIAGNOSIS — H6692 Otitis media, unspecified, left ear: Secondary | ICD-10-CM | POA: Diagnosis not present

## 2022-05-24 MED ORDER — FLUCONAZOLE 150 MG PO TABS: 150.0000 mg | ORAL_TABLET | Freq: Every day | ORAL | 0 refills | Status: DC | PRN

## 2022-05-24 MED ORDER — AMOXICILLIN-POT CLAVULANATE 875-125 MG PO TABS: 1.0000 | ORAL_TABLET | Freq: Two times a day (BID) | ORAL | 0 refills | Status: DC

## 2022-05-24 NOTE — Telephone Encounter (Signed)
Pt asked if we could print off the payments she has made for her copays this year for her taxes.

## 2022-05-24 NOTE — Progress Notes (Signed)
   Subjective:    Patient ID: Rebecca Soto, female    DOB: October 20, 1970, 51 y.o.   MRN: [id number]  HPI She complains of an 8-day history of left earache as well as left throat discomfort.  She was placed on Amoxil as well as eardrops by the doctor at the [place] that she works at.  She also has a history of goiter and is presently on thyroid medication.   Review of Systems     Objective:   Physical Exam Alert and in no distress.  Left TM is slightly dull.  Right TM was difficult to see but appeared normal.  Throat clear.  Neck is supple without adenopathy.       Assessment & Plan:  Left otitis media, unspecified otitis media type - Plan: fluconazole (DIFLUCAN) 150 MG tablet, amoxicillin-clavulanate (AUGMENTIN) 875-125 MG tablet  Goiter I explained that I thought the sore throat was referred pain from the otitis.  She will use Diflucan on an as-needed basis and continue on her thyroid medicine.

## 2022-05-30 ENCOUNTER — Ambulatory Visit (AMBULATORY_SURGERY_CENTER): Payer: Self-pay

## 2022-05-30 VITALS — Ht 65.0 in | Wt 244.0 lb

## 2022-05-30 DIAGNOSIS — Z86010 Personal history of colonic polyps: Secondary | ICD-10-CM

## 2022-05-30 MED ORDER — NA SULFATE-K SULFATE-MG SULF 17.5-3.13-1.6 GM/177ML PO SOLN: 1.0000 | Freq: Once | ORAL | 0 refills | Status: AC

## 2022-05-30 NOTE — Progress Notes (Signed)

## 2022-06-09 ENCOUNTER — Encounter: Payer: Self-pay | Admitting: Certified Registered Nurse Anesthetist

## 2022-06-10 ENCOUNTER — Encounter: Payer: Self-pay | Admitting: Gastroenterology

## 2022-06-10 ENCOUNTER — Telehealth: Payer: Self-pay | Admitting: Family Medicine

## 2022-06-10 DIAGNOSIS — H6692 Otitis media, unspecified, left ear: Secondary | ICD-10-CM

## 2022-06-10 MED ORDER — AMOXICILLIN-POT CLAVULANATE 875-125 MG PO TABS: 1.0000 | ORAL_TABLET | Freq: Two times a day (BID) | ORAL | 0 refills | Status: DC

## 2022-06-10 NOTE — Telephone Encounter (Signed)
Pt called and states that she saw JCL for ear infection and feels like it is returning. She saw the nurse today at work and was informed that her ear is indeed red. Pt is requesting another round of medication. Pt uses [facility] on [address].

## 2022-06-11 ENCOUNTER — Telehealth: Payer: Self-pay | Admitting: Family Medicine

## 2022-06-11 ENCOUNTER — Ambulatory Visit: Payer: Commercial Managed Care - PPO | Admitting: Medical

## 2022-06-11 NOTE — Telephone Encounter (Signed)
Done

## 2022-06-17 ENCOUNTER — Encounter: Payer: Self-pay | Admitting: Gastroenterology

## 2022-06-17 ENCOUNTER — Ambulatory Visit (AMBULATORY_SURGERY_CENTER): Payer: Commercial Managed Care - PPO | Admitting: Gastroenterology

## 2022-06-17 VITALS — BP 122/94 | HR 76 | Temp 98.7°F | Resp 22 | Ht 65.0 in | Wt 244.0 lb

## 2022-06-17 DIAGNOSIS — Z86010 Personal history of colonic polyps: Secondary | ICD-10-CM | POA: Diagnosis not present

## 2022-06-17 DIAGNOSIS — Z09 Encounter for follow-up examination after completed treatment for conditions other than malignant neoplasm: Secondary | ICD-10-CM

## 2022-06-17 DIAGNOSIS — K640 First degree hemorrhoids: Secondary | ICD-10-CM

## 2022-06-17 DIAGNOSIS — D125 Benign neoplasm of sigmoid colon: Secondary | ICD-10-CM

## 2022-06-17 MED ORDER — SODIUM CHLORIDE 0.9 % IV SOLN: 500.0000 mL | Freq: Once | INTRAVENOUS | Status: DC

## 2022-06-17 NOTE — Progress Notes (Signed)
Report given to PACU, vss 

## 2022-06-17 NOTE — Op Note (Signed)
[place] Patient Name: Rebecca Soto Procedure Date: 06/17/2022 11:18 AM MRN: [id number] Endoscopist: Gerrit Heck , MD, [id number] Age: 51 Referring MD:  Date of Birth: [birth year] Gender: Female Account #: [id number] Procedure:                Colonoscopy Indications:              Surveillance: Personal history of adenomatous                            polyps, inadequate prep on last colonoscopy (1 year                            ago)                           Colonoscopy in 03/2021 for initial CRC screening                            with 2 small polyps in the sigmoid and transverse                            colon (TA x1, HP x1), internal hemorrhoids. Due to                            fair bowel preparation, recommended short interval                            repeat in 1 year. Medicines:                Monitored Anesthesia Care Procedure:                Pre-Anesthesia Assessment:                           - Prior to the procedure, a History and Physical                            was performed, and patient medications and                            allergies were reviewed. The patient's tolerance of                            previous anesthesia was also reviewed. The risks                            and benefits of the procedure and the sedation                            options and risks were discussed with the patient.                            All questions were answered, and informed consent  was obtained. Prior Anticoagulants: The patient has                            taken no anticoagulant or antiplatelet agents. ASA                            Grade Assessment: III - A patient with severe                            systemic disease. After reviewing the risks and                            benefits, the patient was deemed in satisfactory                            condition to undergo the procedure.                           After  obtaining informed consent, the colonoscope                            was passed under direct vision. Throughout the                            procedure, the patient's blood pressure, pulse, and                            oxygen saturations were monitored continuously. The                            CF [id number] #[id number] was introduced through the anus                            and advanced to the the terminal ileum. The                            colonoscopy was performed without difficulty. The                            patient tolerated the procedure well. The quality                            of the bowel preparation was good (extended 2-day                            prep). The terminal ileum, ileocecal valve,                            appendiceal orifice, and rectum were photographed. Scope In: 11:35:06 AM Scope Out: 11:48:34 AM Scope Withdrawal Time: 0 hours 10 minutes 51 seconds  Total Procedure Duration: 0 hours 13 minutes 28 seconds  Findings:                 The perianal and digital rectal examinations were  normal.                           A 3 mm polyp was found in the sigmoid colon. The                            polyp was sessile. The polyp was removed with a                            cold snare. Resection and retrieval were complete.                            Estimated blood loss was minimal.                           Non-bleeding internal hemorrhoids were found during                            retroflexion. The hemorrhoids were small.                           The exam was otherwise normal throughout the                            remainder of the colon.                           The terminal ileum appeared normal. Complications:            No immediate complications. Estimated Blood Loss:     Estimated blood loss was minimal. Impression:               - One 3 mm polyp in the sigmoid colon, removed with                            a cold  snare. Resected and retrieved.                           - Non-bleeding internal hemorrhoids.                           - The examined portion of the ileum was normal. Recommendation:           - Patient has a contact number available for                            emergencies. The signs and symptoms of potential                            delayed complications were discussed with the                            patient. Return to normal activities tomorrow.                            Written discharge instructions  were provided to the                            patient.                           - Resume previous diet.                           - Continue present medications.                           - Await pathology results.                           - Repeat colonoscopy for surveillance based on                            pathology results.                           - Return to GI office PRN. Gerrit Heck, MD 06/17/2022 11:52:57 AM

## 2022-06-17 NOTE — Progress Notes (Signed)
Called to room to assist during endoscopic procedure.  Patient ID and intended procedure confirmed with present staff. Received instructions for my participation in the procedure from the performing physician.  

## 2022-06-17 NOTE — Progress Notes (Signed)
Pt's states no medical or surgical changes since previsit or office visit. 

## 2022-06-17 NOTE — Progress Notes (Signed)
GASTROENTEROLOGY PROCEDURE H&P NOTE   Primary Care Physician: Carlena Hurl, PA-C    Reason for Procedure:  Colon Cancer screening, colon polyp surveillance  Plan:    Colonoscopy  Patient is appropriate for endoscopic procedure(s) in the ambulatory ([facility]) setting.  The nature of the procedure, as well as the risks, benefits, and alternatives were carefully and thoroughly reviewed with the patient. Ample time for discussion and questions allowed. The patient understood, was satisfied, and agreed to proceed.     HPI: Rebecca Soto is a 51 y.o. female who presents for colonoscopy for routine Colon Cancer screening and polyp surveillance.  No active GI symptoms.  No known family history of colon cancer or related malignancy.    Colonoscopy in 03/2021 for initial CRC screening with 2 small polyps in the sigmoid and transverse colon (TA x1, HP x1), internal hemorrhoids.  Due to fair bowel preparation, recommended short interval repeat in 1 year.  Past Medical History:  Diagnosis Date   Allergy    Asthma    Blood transfusion without reported diagnosis    Goiter    Herpes labialis 08/22/2017   Hyperlipidemia    Hypertension 2019    Past Surgical History:  Procedure Laterality Date   ABDOMINAL HYSTERECTOMY  2008   still has ovaries, fibroids   COLONOSCOPY     TOTAL ABDOMINAL HYSTERECTOMY  2008    Prior to Admission medications   Medication Sig Start Date End Date Taking? Authorizing Provider  amLODipine (NORVASC) 5 MG tablet Take 1 tablet (5 mg total) by mouth daily. 03/29/22  Yes Tysinger, Camelia Eng, PA-C  budesonide-formoterol (SYMBICORT) 160-4.5 MCG/ACT inhaler TAKE 2 PUFFS BY MOUTH TWICE A DAY 03/29/22  Yes Tysinger, Camelia Eng, PA-C  Cholecalciferol (VITAMIN D) 50 MCG (2000 UT) CAPS Take 1 capsule (2,000 Units total) by mouth daily. 03/29/22  Yes Tysinger, Camelia Eng, PA-C  fluconazole (DIFLUCAN) 150 MG tablet Take 1 tablet (150 mg total) by mouth daily as needed. 05/24/22   Yes Denita Lung, MD  levothyroxine (SYNTHROID) 50 MCG tablet Take 1 tablet (50 mcg total) by mouth daily. 03/29/22  Yes Tysinger, Camelia Eng, PA-C  montelukast (SINGULAIR) 10 MG tablet TAKE 1 TABLET BY MOUTH EVERYDAY AT BEDTIME 09/24/21  Yes Tysinger, Camelia Eng, PA-C  rosuvastatin (CRESTOR) 10 MG tablet Take 1 tablet (10 mg total) by mouth daily. 03/29/22  Yes Tysinger, Camelia Eng, PA-C  albuterol (VENTOLIN HFA) 108 (90 Base) MCG/ACT inhaler TAKE 2 PUFFS BY MOUTH EVERY 6 HOURS AS NEEDED FOR WHEEZE OR SHORTNESS OF BREATH 03/29/22   Tysinger, Camelia Eng, PA-C  ibuprofen (ADVIL) 600 MG tablet Take 1 tablet by mouth every 8 (eight) hours as needed.    [provider]  Semaglutide-Weight Management (WEGOVY) 0.25 MG/0.5ML SOAJ Inject 0.25 mg into the skin once a week. Patient not taking: Reported on 05/30/2022 03/28/22   Tysinger, Camelia Eng, PA-C  valACYclovir (VALTREX) 1000 MG tablet TAKE 2 TABLETS BY MOUTH EVERY 12HOURS X 1 DAY. START ASAP AT ONSET OF SYMPTOMS. Patient not taking: Reported on 05/30/2022 03/29/22   Tysinger, Camelia Eng, PA-C    Current Outpatient Medications  Medication Sig Dispense Refill   amLODipine (NORVASC) 5 MG tablet Take 1 tablet (5 mg total) by mouth daily. 90 tablet 3   budesonide-formoterol (SYMBICORT) 160-4.5 MCG/ACT inhaler TAKE 2 PUFFS BY MOUTH TWICE A DAY 30.6 each 11   Cholecalciferol (VITAMIN D) 50 MCG (2000 UT) CAPS Take 1 capsule (2,000 Units total) by mouth daily. [place]  capsule 3   fluconazole (DIFLUCAN) 150 MG tablet Take 1 tablet (150 mg total) by mouth daily as needed. 2 tablet 0   levothyroxine (SYNTHROID) 50 MCG tablet Take 1 tablet (50 mcg total) by mouth daily. 90 tablet 3   montelukast (SINGULAIR) 10 MG tablet TAKE 1 TABLET BY MOUTH EVERYDAY AT BEDTIME 90 tablet 1   rosuvastatin (CRESTOR) 10 MG tablet Take 1 tablet (10 mg total) by mouth daily. 90 tablet 3   albuterol (VENTOLIN HFA) 108 (90 Base) MCG/ACT inhaler TAKE 2 PUFFS BY MOUTH EVERY 6 HOURS AS NEEDED FOR WHEEZE OR  SHORTNESS OF BREATH 6.7 each 2   ibuprofen (ADVIL) 600 MG tablet Take 1 tablet by mouth every 8 (eight) hours as needed.     Semaglutide-Weight Management (WEGOVY) 0.25 MG/0.5ML SOAJ Inject 0.25 mg into the skin once a week. (Patient not taking: Reported on 05/30/2022) 2 mL 0   valACYclovir (VALTREX) 1000 MG tablet TAKE 2 TABLETS BY MOUTH EVERY 12HOURS X 1 DAY. START ASAP AT ONSET OF SYMPTOMS. (Patient not taking: Reported on 05/30/2022) 30 tablet 1   Current Facility-Administered Medications  Medication Dose Route Frequency Provider Last Rate Last Admin   0.9 %  sodium chloride infusion  500 mL Intravenous Once Beverley Allender V, DO        Allergies as of 06/17/2022 - Review Complete 06/17/2022  Allergen Reaction Noted   Chlorpheniramine Itching 11/01/2016   Diphenhydramine hcl Other (See Comments) 10/16/2011   Iodinated contrast media Hives 02/15/2021   Other Anaphylaxis and Hives 10/16/2011   Aspirin Other (See Comments) 10/16/2011   Diphenhydramine  04/18/2021   Iohexol Hives 06/01/2014   Latex  12/21/2012    Family History  Problem Relation Age of Onset   Hypertension Mother    Heart disease Mother        cardiomegaly   Other Mother        brain tumor   Aneurysm Mother        brain   Esophageal cancer Father    Hypertension Father    Cancer Father        throat   Hypertension Sister    Hypertension Sister    Hypertension Brother    Diabetes Brother    Diabetes Brother    Hypertension Brother    Diabetes Brother    Hypertension Brother    Diabetes Brother    Hypertension Brother    Diabetes Brother    Hypertension Brother    Diabetes Brother    Hypertension Brother    Hypertension Brother    Diabetes Maternal Grandmother    Hypertension Maternal Grandmother    Hypertension Maternal Grandfather    Hypertension Paternal Grandmother    Hypertension Paternal Grandfather    Cancer Other    Hypertension Other    Hyperlipidemia Other    Colon cancer Neg Hx     Colon polyps Neg Hx    Rectal cancer Neg Hx    Stomach cancer Neg Hx     Social History   Socioeconomic History   Marital status: Single    Spouse name: Not on file   Number of children: Not on file   Years of education: Not on file   Highest education level: Not on file  Occupational History   Not on file  Tobacco Use   Smoking status: Never   Smokeless tobacco: Never  Vaping Use   Vaping Use: Never used  Substance and Sexual Activity   Alcohol use: Yes  Alcohol/week: 2.0 standard drinks of alcohol    Types: 2 Glasses of wine per week   Drug use: No   Sexual activity: Yes    Birth control/protection: Surgical  Other Topics Concern   Not on file  Social History Narrative   Lives with mom and son.  Has 1 boyfriend times 1 year.  Works as Marine scientist, [employer]  Exercise - walking.  11/2020   Social Determinants of Health   Financial Resource Strain: Not on file  Food Insecurity: Not on file  Transportation Needs: Not on file  Physical Activity: Not on file  Stress: Not on file  Social Connections: Not on file  Intimate Partner Violence: Not on file    Physical Exam: Vital signs in last 24 hours: '@BP'$  137/78   Pulse 83   Temp 98.7 F (37.1 C) (Temporal)   Ht '5\' 5"'$  (1.651 m)   Wt 244 lb (110.7 kg)   SpO2 97%   BMI 40.60 kg/m  GEN: NAD EYE: Sclerae anicteric ENT: MMM CV: Non-tachycardic Pulm: CTA b/l GI: Soft, NT/ND NEURO:  Alert & Oriented x 3   Gerrit Heck, DO [facility]   06/17/2022 11:26 AM

## 2022-06-17 NOTE — Patient Instructions (Signed)
Handout on hemorrhoids and polyps given to patient. Await pathology results. Resume previous diet and continue present medications. Repeat colonoscopy for surveillance will be based off of pathology results.   YOU HAD AN ENDOSCOPIC PROCEDURE TODAY AT [facility]:   Refer to the procedure report that was given to you for any specific questions about what was found during the examination.  If the procedure report does not answer your questions, please call your gastroenterologist to clarify.  If you requested that your care partner not be given the details of your procedure findings, then the procedure report has been included in a sealed envelope for you to review at your convenience later.  YOU SHOULD EXPECT: Some feelings of bloating in the abdomen. Passage of more gas than usual.  Walking can help get rid of the air that was put into your GI tract during the procedure and reduce the bloating. If you had a lower endoscopy (such as a colonoscopy or flexible sigmoidoscopy) you may notice spotting of blood in your stool or on the toilet paper. If you underwent a bowel prep for your procedure, you may not have a normal bowel movement for a few days.  Please Note:  You might notice some irritation and congestion in your nose or some drainage.  This is from the oxygen used during your procedure.  There is no need for concern and it should clear up in a day or so.  SYMPTOMS TO REPORT IMMEDIATELY:  Following lower endoscopy (colonoscopy or flexible sigmoidoscopy):  Excessive amounts of blood in the stool  Significant tenderness or worsening of abdominal pains  Swelling of the abdomen that is new, acute  Fever of 100F or higher  For urgent or emergent issues, a gastroenterologist can be reached at any hour by calling [phone]. Do not use MyChart messaging for urgent concerns.    DIET:  We do recommend a small meal at first, but then you may proceed to your regular diet.  Drink  plenty of fluids but you should avoid alcoholic beverages for 24 hours.  ACTIVITY:  You should plan to take it easy for the rest of today and you should NOT DRIVE or use heavy machinery until tomorrow (because of the sedation medicines used during the test).    FOLLOW UP: Our staff will call the number listed on your records the next business day following your procedure.  We will call around 7:15- 8:00 am to check on you and address any questions or concerns that you may have regarding the information given to you following your procedure. If we do not reach you, we will leave a message.     If any biopsies were taken you will be contacted by phone or by letter within the next 1-3 weeks.  Please call us at [phone] if you have not heard about the biopsies in 3 weeks.    SIGNATURES/CONFIDENTIALITY: You and/or your care partner have signed paperwork which will be entered into your electronic medical record.  These signatures attest to the fact that that the information above on your After Visit Summary has been reviewed and is understood.  Full responsibility of the confidentiality of this discharge information lies with you and/or your care-partner.

## 2022-06-18 ENCOUNTER — Telehealth: Payer: Self-pay | Admitting: *Deleted

## 2022-06-18 NOTE — Telephone Encounter (Signed)
  Follow up Call-     06/17/2022   10:27 AM 04/09/2021   10:27 AM  Call back number  Post procedure Call Back phone  # [phone]  Permission to leave phone message Yes Yes     Patient questions:  Do you have a fever, pain , or abdominal swelling? No. Pain Score  0 *  Have you tolerated food without any problems? Yes.    Have you been able to return to your normal activities? Yes.    Do you have any questions about your discharge instructions: Diet   No. Medications  No. Follow up visit  No.  Do you have questions or concerns about your Care? No.  Actions: * If pain score is 4 or above: No action needed, pain <4.

## 2022-06-20 ENCOUNTER — Encounter: Payer: Self-pay | Admitting: Gastroenterology

## 2022-06-28 ENCOUNTER — Other Ambulatory Visit: Payer: Self-pay | Admitting: Family Medicine

## 2022-06-28 DIAGNOSIS — H6692 Otitis media, unspecified, left ear: Secondary | ICD-10-CM

## 2022-07-01 NOTE — Telephone Encounter (Signed)
Pt. Requesting refill due to be on anti biotics recently.

## 2022-07-02 MED ORDER — FLUCONAZOLE 150 MG PO TABS: 150.0000 mg | ORAL_TABLET | Freq: Every day | ORAL | 0 refills | Status: DC | PRN

## 2022-07-19 ENCOUNTER — Telehealth: Payer: Self-pay | Admitting: Medical

## 2022-07-19 NOTE — Telephone Encounter (Signed)
Pt called re YHTMBP rx, she has never started taking Wegovy contacted pharmacy today , they only have higher doses Can not get lower doses will be sending you request to fill at higher dose

## 2022-07-23 ENCOUNTER — Other Ambulatory Visit: Payer: Self-pay | Admitting: Medical

## 2022-07-23 ENCOUNTER — Other Ambulatory Visit (HOSPITAL_COMMUNITY): Payer: Self-pay

## 2022-07-23 MED ORDER — WEGOVY 0.25 MG/0.5ML ~~LOC~~ SOAJ
0.2500 mg | SUBCUTANEOUS | 0 refills | Status: DC
  Filled 2022-07-23: qty 2, 28d supply, fill #0

## 2022-07-23 NOTE — Telephone Encounter (Signed)
Pt is wanting the higher dose of wegovy as no pharmacy has long dose. Pt states she was on ozempic before so that's why she wanted higher dose of wegovy. [facility] pharmacies do not have lower doses of wegovy

## 2022-07-24 ENCOUNTER — Other Ambulatory Visit: Payer: Self-pay | Admitting: Medical

## 2022-07-24 MED ORDER — WEGOVY 0.5 MG/0.5ML ~~LOC~~ SOAJ: 0.5000 mg | SUBCUTANEOUS | 0 refills | Status: DC

## 2022-07-26 ENCOUNTER — Other Ambulatory Visit (HOSPITAL_COMMUNITY): Payer: Self-pay

## 2022-07-26 ENCOUNTER — Other Ambulatory Visit: Payer: Self-pay | Admitting: Medical

## 2022-07-26 MED ORDER — WEGOVY 1 MG/0.5ML ~~LOC~~ SOAJ
1.0000 mg | SUBCUTANEOUS | 0 refills | Status: DC
  Filled 2022-07-26: qty 2, 28d supply, fill #0

## 2022-07-26 NOTE — Telephone Encounter (Signed)
Pt needs higher dose of wegovy sent to pharmacy as they do not have lower doses

## 2022-07-30 ENCOUNTER — Telehealth: Payer: Self-pay | Admitting: Medical

## 2022-07-30 MED ORDER — WEGOVY 1 MG/0.5ML ~~LOC~~ SOAJ: 1.0000 mg | SUBCUTANEOUS | 0 refills | Status: DC

## 2022-07-30 NOTE — Telephone Encounter (Signed)
done

## 2022-07-30 NOTE — Telephone Encounter (Signed)
Pt called, KYBTVD rx sent to wrong pharmacy

## 2022-08-15 ENCOUNTER — Other Ambulatory Visit (HOSPITAL_COMMUNITY): Payer: Self-pay

## 2022-09-19 ENCOUNTER — Other Ambulatory Visit: Payer: Self-pay | Admitting: Medical

## 2022-09-19 DIAGNOSIS — B001 Herpesviral vesicular dermatitis: Secondary | ICD-10-CM

## 2022-09-19 NOTE — Telephone Encounter (Signed)
Refill request pt. No longer on that Vit D dosage last apt 03/28/22.

## 2022-09-28 ENCOUNTER — Other Ambulatory Visit: Payer: Self-pay | Admitting: Medical

## 2022-11-16 ENCOUNTER — Other Ambulatory Visit: Payer: Self-pay | Admitting: Medical

## 2022-11-30 ENCOUNTER — Other Ambulatory Visit: Payer: Self-pay | Admitting: Medical

## 2022-12-02 NOTE — Telephone Encounter (Signed)
Pt is due for an appointment.

## 2022-12-02 NOTE — Telephone Encounter (Signed)
Left message for pt to call me back 

## 2022-12-05 ENCOUNTER — Other Ambulatory Visit: Payer: Self-pay | Admitting: Medical

## 2022-12-05 NOTE — Telephone Encounter (Signed)
Left message for pt to call back. Pt is overdue for an appointment

## 2022-12-12 ENCOUNTER — Other Ambulatory Visit: Payer: Self-pay | Admitting: Medical

## 2022-12-25 ENCOUNTER — Other Ambulatory Visit: Payer: Self-pay | Admitting: Medical

## 2023-01-03 ENCOUNTER — Other Ambulatory Visit: Payer: Self-pay | Admitting: Medical

## 2023-01-17 ENCOUNTER — Telehealth: Payer: Self-pay | Admitting: Internal Medicine

## 2023-01-17 NOTE — Telephone Encounter (Signed)
P.A wegovy sent through Covermymeds. Waiting on response 

## 2023-01-29 ENCOUNTER — Other Ambulatory Visit: Payer: Self-pay | Admitting: Medical

## 2023-03-02 ENCOUNTER — Telehealth: Payer: Self-pay | Admitting: Medical

## 2023-03-02 NOTE — Telephone Encounter (Signed)
P.A. [facility] Resubmitted (Key: [id number]) Rx #: [id number] JYNWGN 1MG /0.5ML auto-injectors Form [employer] Electronic PA Form [phone] NCPDP)

## 2023-03-03 NOTE — Telephone Encounter (Signed)
I resent P.A.

## 2023-03-03 NOTE — Telephone Encounter (Signed)
Records sent in for P.A.

## 2023-03-13 ENCOUNTER — Encounter: Payer: Self-pay | Admitting: Internal Medicine

## 2023-03-13 ENCOUNTER — Other Ambulatory Visit: Payer: Self-pay | Admitting: Medical

## 2023-03-17 NOTE — Telephone Encounter (Signed)
P.A. denied, insurance needs proof

## 2023-03-20 ENCOUNTER — Institutional Professional Consult (permissible substitution): Payer: Commercial Managed Care - PPO | Admitting: Medical

## 2023-03-20 NOTE — Progress Notes (Deleted)
Subjective:  Rebecca Soto is a 52 y.o. female who presents for No chief complaint on file.    Due CPX  ***  No other aggravating or relieving factors.    No other c/o.  The following portions of the patient's history were reviewed and updated as appropriate: allergies, current medications, past family history, past medical history, past social history, past surgical history and problem list.  ROS Otherwise as in subjective above  Objective: There were no vitals taken for this visit.  General appearance: alert, no distress, well developed, well nourished HEENT: normocephalic, sclerae anicteric, conjunctiva pink and moist, TMs pearly, nares patent, no discharge or erythema, pharynx normal Oral cavity: MMM, no lesions Neck: supple, no lymphadenopathy, no thyromegaly, no masses Heart: RRR, normal S1, S2, no murmurs Lungs: CTA bilaterally, no wheezes, rhonchi, or rales Abdomen: +bs, soft, non tender, non distended, no masses, no hepatomegaly, no splenomegaly Pulses: 2+ radial pulses, 2+ pedal pulses, normal cap refill Ext: no edema   Assessment: Encounter Diagnoses  Name Primary?   Hypothyroidism, unspecified type Yes   BMI 40.0-44.9, adult (HCC)    Essential hypertension, benign    Gastroesophageal reflux disease, unspecified whether esophagitis present      Plan: ***  Diagnoses and all orders for this visit:  Hypothyroidism, unspecified type  BMI 40.0-44.9, adult (HCC)  Essential hypertension, benign  Gastroesophageal reflux disease, unspecified whether esophagitis present    Follow up: ***

## 2023-03-20 NOTE — Telephone Encounter (Signed)
This patient no showed for their appointment today.Which of the following is necessary for this patient.   A) No follow-up necessary   B) Follow-up urgent. Locate Patient Immediately.   C) Follow-up necessary. Contact patient and Schedule visit in ____ Days.   D) Follow-up Advised. Contact patient and Schedule visit in ____ Days.   E) Please Send no show letter to patient. Charge no show fee if no show was a CPE.   

## 2023-03-25 ENCOUNTER — Other Ambulatory Visit: Payer: Self-pay | Admitting: Medical

## 2023-04-05 ENCOUNTER — Other Ambulatory Visit: Payer: Self-pay | Admitting: Medical

## 2023-04-08 ENCOUNTER — Other Ambulatory Visit: Payer: Self-pay | Admitting: Medical

## 2023-04-08 DIAGNOSIS — Z1231 Encounter for screening mammogram for malignant neoplasm of breast: Secondary | ICD-10-CM

## 2023-04-13 ENCOUNTER — Other Ambulatory Visit: Payer: Self-pay | Admitting: Medical

## 2023-04-23 ENCOUNTER — Encounter: Payer: Self-pay | Admitting: *Deleted

## 2023-05-08 ENCOUNTER — Ambulatory Visit
Admission: RE | Admit: 2023-05-08 | Discharge: 2023-05-08 | Disposition: A | Discharge status: Home / Self Care | Payer: Commercial Managed Care - PPO | Source: Ambulatory Visit | Attending: Medical | Admitting: Medical

## 2023-05-08 DIAGNOSIS — Z1231 Encounter for screening mammogram for malignant neoplasm of breast: Secondary | ICD-10-CM

## 2023-05-11 ENCOUNTER — Other Ambulatory Visit: Payer: Self-pay | Admitting: Medical

## 2023-05-12 NOTE — Progress Notes (Signed)
Get on the schedule for well visit as she is due

## 2023-05-14 ENCOUNTER — Ambulatory Visit: Payer: Commercial Managed Care - PPO | Admitting: Medical

## 2023-05-14 ENCOUNTER — Encounter: Payer: Self-pay | Admitting: Medical

## 2023-05-14 VITALS — BP 124/80 | HR 82 | Ht 65.5 in | Wt 251.4 lb

## 2023-05-14 DIAGNOSIS — E039 Hypothyroidism, unspecified: Secondary | ICD-10-CM

## 2023-05-14 DIAGNOSIS — E559 Vitamin D deficiency, unspecified: Secondary | ICD-10-CM | POA: Diagnosis not present

## 2023-05-14 DIAGNOSIS — E041 Nontoxic single thyroid nodule: Secondary | ICD-10-CM | POA: Diagnosis not present

## 2023-05-14 DIAGNOSIS — Z131 Encounter for screening for diabetes mellitus: Secondary | ICD-10-CM | POA: Diagnosis not present

## 2023-05-14 DIAGNOSIS — Z6841 Body Mass Index (BMI) 40.0 and over, adult: Secondary | ICD-10-CM | POA: Diagnosis not present

## 2023-05-14 DIAGNOSIS — Z0000 Encounter for general adult medical examination without abnormal findings: Secondary | ICD-10-CM | POA: Diagnosis not present

## 2023-05-14 DIAGNOSIS — Z13220 Encounter for screening for lipoid disorders: Secondary | ICD-10-CM | POA: Diagnosis not present

## 2023-05-14 DIAGNOSIS — J4540 Moderate persistent asthma, uncomplicated: Secondary | ICD-10-CM

## 2023-05-14 LAB — POCT URINALYSIS DIP (PROADVANTAGE DEVICE)
Blood, UA: NEGATIVE
Glucose, UA: NEGATIVE mg/dL
Leukocytes, UA: NEGATIVE
Nitrite, UA: NEGATIVE
Protein Ur, POC: NEGATIVE mg/dL
Specific Gravity, Urine: 1.02
Urobilinogen, Ur: NEGATIVE
pH, UA: 7 (ref 5.0–8.0)

## 2023-05-14 NOTE — Progress Notes (Signed)
Subjective:   HPI  Rebecca Soto is a 52 y.o. female who presents for Chief Complaint  Patient presents with   Annual Exam    Nonfasting cpe, had yogurt recently. Weight issues    Patient Care Team: Khayden Herzberg, Kermit Balo, PA-C as PCP - General (Family Medicine) Dr. Doristine Locks, GI Dr. Scarlette Ar, ENT [facility] Dr. Alverda Skeans, cardiology   Concerns: HTN - compliant with amlodipine 5mg  daily  Hyperlipidemia - compliant with lipid medication Crestor 10mg  daily  Vit D deficiency - compliant with supplement  Hypothyroidism - compliant with medicaiton  Asthma and allergies - wixela  not as affective as prior medicaiton    Reviewed their medical, surgical, family, social, medication, and allergy history and updated chart as appropriate.  Allergies  Allergen Reactions   Chlorpheniramine Itching   Diphenhydramine Hcl Other (See Comments)    Unknown   Iodinated Contrast Media Hives    Other reaction(s): Unknown   Other Anaphylaxis and Hives    Artichokes IV contrast/dye   Aspirin Other (See Comments)    Dry throat and coughing  Other reaction(s): Unknown   Diphenhydramine     Other reaction(s): Unknown   Iohexol Hives   Latex     Other reaction(s): Unknown    Past Medical History:  Diagnosis Date   Allergy    Asthma    Blood transfusion without reported diagnosis    Goiter    Herpes labialis 08/22/2017   Hyperlipidemia    Hypertension 2019    Current Outpatient Medications on File Prior to Visit  Medication Sig Dispense Refill   albuterol (VENTOLIN HFA) 108 (90 Base) MCG/ACT inhaler TAKE 2 PUFFS BY MOUTH EVERY 6 HOURS AS NEEDED FOR WHEEZE OR SHORTNESS OF BREATH 6.7 each 2   amLODipine (NORVASC) 5 MG tablet Take 1 tablet (5 mg total) by mouth daily. 90 tablet 3   Cholecalciferol (VITAMIN D) 50 MCG (2000 UT) CAPS Take 1 capsule (2,000 Units total) by mouth daily. 90 capsule 3   cholecalciferol (VITAMIN D3) 25 MCG (1000 UNIT) tablet TAKE  1 TABLET BY MOUTH EVERY DAY 90 tablet 0   fluticasone-salmeterol (WIXELA INHUB) 250-50 MCG/ACT AEPB INHALE 1 PUFF INTO THE LUNGS IN THE MORNING AND AT BEDTIME. 60 each 1   ibuprofen (ADVIL) 600 MG tablet Take 1 tablet by mouth every 8 (eight) hours as needed.     levothyroxine (SYNTHROID) 50 MCG tablet Take 1 tablet (50 mcg total) by mouth daily. 90 tablet 3   montelukast (SINGULAIR) 10 MG tablet TAKE 1 TABLET BY MOUTH EVERYDAY AT BEDTIME 90 tablet 0   rosuvastatin (CRESTOR) 10 MG tablet Take 1 tablet (10 mg total) by mouth daily. 90 tablet 3   valACYclovir (VALTREX) 1000 MG tablet TAKE 2 TABLETS BY MOUTH EVERY 12HOURS X 1 DAY. START ASAP AT ONSET OF SYMPTOMS. 30 tablet 1   No current facility-administered medications on file prior to visit.      Current Outpatient Medications:    albuterol (VENTOLIN HFA) 108 (90 Base) MCG/ACT inhaler, TAKE 2 PUFFS BY MOUTH EVERY 6 HOURS AS NEEDED FOR WHEEZE OR SHORTNESS OF BREATH, Disp: 6.7 each, Rfl: 2   amLODipine (NORVASC) 5 MG tablet, Take 1 tablet (5 mg total) by mouth daily., Disp: 90 tablet, Rfl: 3   Cholecalciferol (VITAMIN D) 50 MCG (2000 UT) CAPS, Take 1 capsule (2,000 Units total) by mouth daily., Disp: 90 capsule, Rfl: 3   cholecalciferol (VITAMIN D3) 25 MCG (1000 UNIT) tablet, TAKE 1 TABLET BY  MOUTH EVERY DAY, Disp: 90 tablet, Rfl: 0   fluticasone-salmeterol (WIXELA INHUB) 250-50 MCG/ACT AEPB, INHALE 1 PUFF INTO THE LUNGS IN THE MORNING AND AT BEDTIME., Disp: 60 each, Rfl: 1   ibuprofen (ADVIL) 600 MG tablet, Take 1 tablet by mouth every 8 (eight) hours as needed., Disp: , Rfl:    levothyroxine (SYNTHROID) 50 MCG tablet, Take 1 tablet (50 mcg total) by mouth daily., Disp: 90 tablet, Rfl: 3   montelukast (SINGULAIR) 10 MG tablet, TAKE 1 TABLET BY MOUTH EVERYDAY AT BEDTIME, Disp: 90 tablet, Rfl: 0   rosuvastatin (CRESTOR) 10 MG tablet, Take 1 tablet (10 mg total) by mouth daily., Disp: 90 tablet, Rfl: 3   valACYclovir (VALTREX) 1000 MG tablet, TAKE  2 TABLETS BY MOUTH EVERY 12HOURS X 1 DAY. START ASAP AT ONSET OF SYMPTOMS., Disp: 30 tablet, Rfl: 1  Family History  Problem Relation Age of Onset   Hypertension Mother    Heart disease Mother        cardiomegaly   Other Mother        brain tumor   Aneurysm Mother        brain   Esophageal cancer Father    Hypertension Father    Cancer Father        throat   Hypertension Sister    Hypertension Sister    Hypertension Brother    Diabetes Brother    Diabetes Brother    Hypertension Brother    Diabetes Brother    Hypertension Brother    Diabetes Brother    Hypertension Brother    Diabetes Brother    Hypertension Brother    Diabetes Brother    Hypertension Brother    Hypertension Brother    Diabetes Maternal Grandmother    Hypertension Maternal Grandmother    Hypertension Maternal Grandfather    Hypertension Paternal Grandmother    Hypertension Paternal Grandfather    Cancer Other    Hypertension Other    Hyperlipidemia Other    Colon cancer Neg Hx    Colon polyps Neg Hx    Rectal cancer Neg Hx    Stomach cancer Neg Hx     Past Surgical History:  Procedure Laterality Date   ABDOMINAL HYSTERECTOMY  2008   still has ovaries, fibroids   COLONOSCOPY     TOTAL ABDOMINAL HYSTERECTOMY  2008    Review of Systems  Constitutional:  Negative for chills, fever, malaise/fatigue and weight loss.  HENT:  Negative for congestion, ear pain, hearing loss, sore throat and tinnitus.   Eyes:  Negative for blurred vision, pain and redness.  Respiratory:  Negative for cough, hemoptysis and shortness of breath.   Cardiovascular:  Negative for chest pain, palpitations, orthopnea, claudication and leg swelling.  Gastrointestinal:  Negative for abdominal pain, blood in stool, constipation, diarrhea, nausea and vomiting.  Genitourinary:  Negative for dysuria, flank pain, frequency, hematuria and urgency.  Musculoskeletal:  Negative for falls, joint pain and myalgias.  Skin:  Negative for  itching and rash.  Neurological:  Negative for dizziness, tingling, speech change, weakness and headaches.  Endo/Heme/Allergies:  Negative for polydipsia. Does not bruise/bleed easily.  Psychiatric/Behavioral:  Negative for depression and memory loss. The patient is not nervous/anxious and does not have insomnia.        Objective:  BP 124/80   Pulse 82   Ht 5' 5.5" (1.664 m)   Wt 251 lb 6.4 oz (114 kg)   BMI 41.20 kg/m   Wt Readings from Last 3  Encounters:  05/14/23 251 lb 6.4 oz (114 kg)  06/17/22 244 lb (110.7 kg)  05/30/22 244 lb (110.7 kg)    General appearance: alert, no distress, WD/WN, African American female Skin: unremarkable HEENT: normocephalic, conjunctiva/corneas normal, sclerae anicteric, PERRLA, EOMi, nares patent, no discharge or erythema, pharynx normal Oral cavity: MMM, tongue normal, teeth normal Neck: supple, no lymphadenopathy,right lower thyroid nodule, otherwise no thyromegaly, no masses, normal ROM, no bruits Chest: non tender, normal shape and expansion Heart: RRR, normal S1, S2, no murmurs Lungs: CTA bilaterally, no wheezes, rhonchi, or rales Abdomen: +bs, soft, non tender, non distended, no masses, no hepatomegaly, no splenomegaly, no bruits Back: non tender, normal ROM, no scoliosis Musculoskeletal: upper extremities non tender, no obvious deformity, normal ROM throughout, lower extremities non tender, no obvious deformity, normal ROM throughout Extremities: no edema, no cyanosis, no clubbing Pulses: 2+ symmetric, upper and lower extremities, normal cap refill Neurological: alert, oriented x 3, CN2-12 intact, strength normal upper extremities and lower extremities, sensation normal throughout, DTRs 2+ throughout, no cerebellar signs, gait normal Psychiatric: normal affect, behavior normal, pleasant  Breast/gyn/rectal - deferred to gynecology     Assessment and Plan :   Encounter Diagnoses  Name Primary?   Routine general medical examination at a  health care facility Yes   Hypothyroidism, unspecified type    Vitamin D deficiency    Screening for lipid disorders    Screening for diabetes mellitus    Thyroid nodule    BMI 40.0-44.9, adult (HCC)      This visit was a preventative care visit, also known as wellness visit or routine physical.   Topics typically include healthy lifestyle, diet, exercise, preventative care, vaccinations, sick and well care, proper use of emergency dept and after hours care, as well as other concerns.    Separate significant issues discussed: Hypertension -continue medication amlodipine 5 mg daily  Hyperlipidemia-on Crestor 10 mg daily, update labs today  Hypothyroidism-updated labs today, continue with levothyroxine 50 mcg daily  Vitamin D deficiency yesterday labs today, continue supplement  Asthma and allergies-she does not have vascular response with Wixela compared to prior inhalers.  Consider other options.  Thyroid nodule-updated ultrasound  BMI greater than 40-she will check insurance coverage for Zepbound versus Wegovy.  Continue efforts with healthy diet and exercise    General Recommendations: Continue to return yearly for your annual wellness and preventative care visits.  This gives [place] a chance to discuss healthy lifestyle, exercise, vaccinations, review your chart record, and perform screenings where appropriate.  I recommend you see your eye doctor yearly for routine vision care.  I recommend you see your dentist yearly for routine dental care including hygiene visits twice yearly.   Vaccination recommendations were reviewed Immunization History  Administered Date(s) Administered   Influenza Inj Mdck Quad Pf 05/09/2017   Influenza Inj Mdck Quad With Preservative 05/09/2017, 03/23/2019   Influenza Split 05/09/2013, 05/12/2023   Influenza, Quadrivalent, Recombinant, Inj, Pf 05/06/2021   Influenza, Seasonal, Injecte, Preservative Fre 04/17/2014   Influenza,inj,Quad PF,6+ Mos  03/24/2016, 04/29/2016, 05/02/2018, 04/05/2019, 03/28/2022   Influenza-Unspecified 05/05/2013, 04/22/2015, 03/22/2016   Moderna SARS-COV2 Booster Vaccination 05/19/2020   Moderna Sars-Covid-2 Vaccination 09/13/2019, 10/12/2019   Pfizer(Comirnaty)Fall Seasonal Vaccine 12 years and older 04/21/2022, 03/23/2023   Pneumococcal Polysaccharide-23 08/14/2018   Tdap 08/14/2018    Vaccine recommendations: Consider Shingrix vaccine  Vaccines administered today: None today   Screening for cancer: Colon cancer screening: Prior or last colon cancer screen: 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   We reviewed recommendations for regular mammograms and breast cancer screening. Last mammogram: 04/2023  Cervical cancer screening: We reviewed recommendations for pap smear screening. Last pap 2021   Skin cancer screening: Check your skin regularly for new changes, growing lesions, or other lesions of concern Come in for evaluation if you have skin lesions of concern.  Lung cancer screening: If you have a greater than 20 pack year history of tobacco use, then you may qualify for lung cancer screening with a chest CT scan.   Please call your insurance company to inquire about coverage for this test.  Pancreatic cancer: no current screening test is available routinely recommended.  (Risk factors: Smoking, overweight or obese, diabetes, chronic pancreatitis, work Nurse, mental health, Solicitor, 59 year old or greater, female greater than female, African-American, family history of pancreatic cancer, hereditary breast, ovarian, melanoma, Lynch, Peutz-jeghers).  We currently don't have screenings for other cancers besides breast, cervical, colon, and lung cancers.  If you have a strong family history of cancer or have other cancer screening concerns, please let me know.    Bone health: Get at least 150 minutes of aerobic  exercise weekly Get weight bearing exercise at least once weekly Bone density test:  A bone density test is an imaging test that uses a type of X-ray to measure the amount of calcium and other minerals in your bones. The test may be used to diagnose or screen you for a condition that causes weak or thin bones (osteoporosis), predict your risk for a broken bone (fracture), or determine how well your osteoporosis treatment is working. The bone density test is recommended for females 65 and older, or females or males <65 if certain risk factors such as thyroid disease, long term use of steroids such as for asthma or rheumatological issues, vitamin D deficiency, estrogen deficiency, family history of osteoporosis, self or family history of fragility fracture in first degree relative.    Heart health: Get at least 150 minutes of aerobic exercise weekly Limit alcohol It is important to maintain a healthy blood pressure and healthy cholesterol numbers  Heart disease screening: Screening for heart disease includes screening for blood pressure, fasting lipids, glucose/diabetes screening, BMI height to weight ratio, reviewed of smoking status, physical activity, and diet.    Goals include blood pressure 120/80 or less, maintaining a healthy lipid/cholesterol profile, preventing diabetes or keeping diabetes numbers under good control, not smoking or using tobacco products, exercising most days per week or at least 150 minutes per week of exercise, and eating healthy variety of fruits and vegetables, healthy oils, and avoiding unhealthy food choices like fried food, fast food, high sugar and high cholesterol foods.    Other tests may possibly include EKG test, CT coronary calcium score, echocardiogram, exercise treadmill stress test.    CT coronary 05/2021, score of zero,   Echocardiogram 06/2021 reviewed   Vascular disease screening: For high risk individuals including smokers, diabetes, patients  with known heart disease or high blood pressure, kidney disease, and others, screening for vascular disease or atherosclerosis of the arteries is available.  Examples may include carotid ultrasound, abdominal aortic ultrasound, ABI blood flow screening in the legs, thoracic aorta screening.   Medical care options: I recommend you continue to seek care here first for routine care.  We try really hard to have available appointments Monday through Friday daytime hours for sick visits, acute visits, and physicals.  Urgent care should be  used for after hours and weekends for significant issues that cannot wait till the next day.  The emergency department should be used for significant potentially life-threatening emergencies.  The emergency department is expensive, can often have long wait times for less significant concerns, so try to utilize primary care, urgent care, or telemedicine when possible to avoid unnecessary trips to the emergency department.  Virtual visits and telemedicine have been introduced since the pandemic started in 2020, and can be convenient ways to receive medical care.  We offer virtual appointments as well to assist you in a variety of options to seek medical care.   Legal Take the time to do a Last Will and Testament, advanced directives including Healthcare Power of Attorney and Living Will documents.  Do not leave your family with burdens that can be handled ahead of time.   Advanced Directives: I recommend you consider completing a Health Care Power of Attorney and Living Will.   These documents respect your wishes and help alleviate burdens on your loved ones if you were to become terminally ill or be in a position to need those documents enforced.    You can complete Advanced Directives yourself, have them notarized, then have copies made for our office, for you and for anybody you feel should have them in safe keeping.  Or, you can have an attorney prepare these documents.    If you haven't updated your Last Will and Testament in a while, it may be worthwhile having an attorney prepare these documents together and save on some costs.       Spiritual and Emotional Health Keeping a healthy spiritual life can help you better manage your physical health. Your spiritual life can help you to cope with any issues that may arise with your physical health.  Balance can keep [place] healthy and help [place] to recover.  If you are struggling with your spiritual health there are questions that you may want to ask yourself:  What makes me feel most complete? When do I feel most connected to the rest of the world? Where do I find the most inner strength? What am I doing when I feel whole?  Helpful tips: Being in nature. Some people feel very connected and at peace when they are walking outdoors or are outside. Helping others. Some feel the largest sense of wellbeing when they are of service to others. Being of service can take on many forms. It can be doing volunteer work, being kind to strangers, or offering a hand to a friend in need. Gratitude. Some people find they feel the most connected when they remain grateful. They may make lists of all the things they are grateful for or say a thank you out loud for all they have.    Emotional Health Are you in tune with your emotional health?  Check out this link: http://www.marquez-love.com/   Financial Health Make sure you use a budget for your personal finances Make sure you are insured against risks (health insurance, life insurance, auto insurance, etc) Save more, spend less Set financial goals If you need help in this area, good resources include counseling through [employer] or other community resources, have a meeting with a Social research officer, government, and a good resource is Salley Slaughter podcast    Daneka was seen today for annual exam.  Diagnoses and all orders for this visit:  Routine general medical  examination at a health care facility -     Comprehensive metabolic panel -  CBC -     Lipid panel -     TSH + free T4 -     Hemoglobin A1c -     POCT Urinalysis DIP (Proadvantage Device) -     VITAMIN D 25 Hydroxy (Vit-D Deficiency, Fractures)  Hypothyroidism, unspecified type -     TSH + free T4  Vitamin D deficiency -     VITAMIN D 25 Hydroxy (Vit-D Deficiency, Fractures)  Screening for lipid disorders -     Lipid panel  Screening for diabetes mellitus -     Hemoglobin A1c  Thyroid nodule -     US THYROID; Future  BMI 40.0-44.9, adult (HCC)     Follow-up pending labs, yearly for physical

## 2023-05-15 ENCOUNTER — Other Ambulatory Visit: Payer: Self-pay | Admitting: Medical

## 2023-05-15 DIAGNOSIS — B001 Herpesviral vesicular dermatitis: Secondary | ICD-10-CM

## 2023-05-15 LAB — COMPREHENSIVE METABOLIC PANEL
ALT: 14 IU/L (ref 0–32)
AST: 15 IU/L (ref 0–40)
Albumin: 4.5 g/dL (ref 3.8–4.9)
Alkaline Phosphatase: 82 IU/L (ref 44–121)
BUN/Creatinine Ratio: 8 — ABNORMAL LOW (ref 9–23)
BUN: 8 mg/dL (ref 6–24)
Bilirubin Total: 0.2 mg/dL (ref 0.0–1.2)
CO2: 21 mmol/L (ref 20–29)
Calcium: 9.5 mg/dL (ref 8.7–10.2)
Chloride: 104 mmol/L (ref 96–106)
Creatinine, Ser: 0.95 mg/dL (ref 0.57–1.00)
Globulin, Total: 3.2 g/dL (ref 1.5–4.5)
Glucose: 97 mg/dL (ref 70–99)
Potassium: 3.7 mmol/L (ref 3.5–5.2)
Sodium: 141 mmol/L (ref 134–144)
Total Protein: 7.7 g/dL (ref 6.0–8.5)
eGFR: 72 mL/min/{1.73_m2} (ref >59)

## 2023-05-15 LAB — LIPID PANEL
Chol/HDL Ratio: 1.9 ratio (ref 0.0–4.4)
Cholesterol, Total: 152 mg/dL (ref 100–199)
HDL: 79 mg/dL (ref >39)
LDL Chol Calc (NIH): 62 mg/dL (ref 0–99)
Triglycerides: 49 mg/dL (ref 0–149)
VLDL Cholesterol Cal: 11 mg/dL (ref 5–40)

## 2023-05-15 LAB — VITAMIN D 25 HYDROXY (VIT D DEFICIENCY, FRACTURES): Vit D, 25-Hydroxy: 29.1 ng/mL — ABNORMAL LOW (ref 30.0–100.0)

## 2023-05-15 LAB — CBC
Hematocrit: 42.2 % (ref 34.0–46.6)
Hemoglobin: 13.5 g/dL (ref 11.1–15.9)
MCH: 29.8 pg (ref 26.6–33.0)
MCHC: 32 g/dL (ref 31.5–35.7)
MCV: 93 fL (ref 79–97)
Platelets: 344 10*3/uL (ref 150–450)
RBC: 4.53 x10E6/uL (ref 3.77–5.28)
RDW: 12.7 % (ref 11.7–15.4)
WBC: 4.9 10*3/uL (ref 3.4–10.8)

## 2023-05-15 LAB — HEMOGLOBIN A1C
Est. average glucose Bld gHb Est-mCnc: 120 mg/dL
Hgb A1c MFr Bld: 5.8 % — ABNORMAL HIGH (ref 4.8–5.6)

## 2023-05-15 LAB — TSH+FREE T4
Free T4: 1.18 ng/dL (ref 0.82–1.77)
TSH: 0.696 u[IU]/mL (ref 0.450–4.500)

## 2023-05-15 MED ORDER — LEVOTHYROXINE SODIUM 50 MCG PO TABS: 50.0000 ug | ORAL_TABLET | Freq: Every day | ORAL | 3 refills | Status: AC

## 2023-05-15 MED ORDER — ZEPBOUND 5 MG/0.5ML ~~LOC~~ SOAJ: 5.0000 mg | SUBCUTANEOUS | 1 refills | Status: DC

## 2023-05-15 MED ORDER — AMLODIPINE BESYLATE 5 MG PO TABS: 5.0000 mg | ORAL_TABLET | Freq: Every day | ORAL | 3 refills | Status: DC

## 2023-05-15 MED ORDER — ALBUTEROL SULFATE HFA 108 (90 BASE) MCG/ACT IN AERS: INHALATION_SPRAY | RESPIRATORY_TRACT | 2 refills | Status: DC

## 2023-05-15 MED ORDER — ROSUVASTATIN CALCIUM 10 MG PO TABS: 10.0000 mg | ORAL_TABLET | Freq: Every day | ORAL | 3 refills | Status: DC

## 2023-05-15 MED ORDER — MONTELUKAST SODIUM 10 MG PO TABS: ORAL_TABLET | ORAL | 3 refills | Status: AC

## 2023-05-15 MED ORDER — VALACYCLOVIR HCL 1 G PO TABS: ORAL_TABLET | ORAL | 1 refills | Status: AC

## 2023-05-15 MED ORDER — VITAMIN D 50 MCG (2000 UT) PO CAPS: 1.0000 | ORAL_CAPSULE | Freq: Every day | ORAL | 3 refills | Status: AC

## 2023-05-15 MED ORDER — VITAMIN D (ERGOCALCIFEROL) 1.25 MG (50000 UNIT) PO CAPS: 50000.0000 [IU] | ORAL_CAPSULE | ORAL | 3 refills | Status: DC

## 2023-05-15 MED ORDER — ZEPBOUND 2.5 MG/0.5ML ~~LOC~~ SOAJ: 2.5000 mg | SUBCUTANEOUS | 0 refills | Status: DC

## 2023-05-15 NOTE — Progress Notes (Signed)
Results sent through MyChart

## 2023-05-19 ENCOUNTER — Telehealth: Payer: Self-pay

## 2023-05-19 ENCOUNTER — Telehealth: Payer: Self-pay | Admitting: Medical

## 2023-05-19 NOTE — Telephone Encounter (Signed)
Pt notified via phone. Approval faxed to the pharmacy.

## 2023-05-19 NOTE — Telephone Encounter (Signed)
Pt was notified of results

## 2023-05-19 NOTE — Telephone Encounter (Signed)
KeyNani Skillern PA Case ID #: [id number] Rx #: [id number] Outcome: Approved today by [employer] NCPDP 2017  Your PA request has been approved. Additional information will be provided in the approval communication.  Authorization Expiration Date: 01/14/2024 Drug: Zepbound 2.5MG /0.5ML pen-injectors Form: Ambulance person PA Form [phone] NCPDP)

## 2023-05-19 NOTE — Telephone Encounter (Signed)
I called patient to give her appt information for her Ultrasound. She has some questions regarding recent lab results in her Mychart and what her next steps should be. Requesting a call to answer her follow up questions.

## 2023-05-23 ENCOUNTER — Ambulatory Visit (HOSPITAL_COMMUNITY)
Admission: RE | Admit: 2023-05-23 | Discharge: 2023-05-23 | Disposition: A | Discharge status: Home / Self Care | Payer: Commercial Managed Care - PPO | Source: Ambulatory Visit | Attending: Medical | Admitting: Medical

## 2023-05-23 DIAGNOSIS — E041 Nontoxic single thyroid nodule: Secondary | ICD-10-CM | POA: Diagnosis present

## 2023-06-04 NOTE — Progress Notes (Signed)
Results sent through MyChart

## 2023-06-09 ENCOUNTER — Telehealth: Payer: Self-pay | Admitting: Internal Medicine

## 2023-06-09 NOTE — Telephone Encounter (Signed)
Pt states when she was here she mentioned that she was having pain on right side at hip. She feels the pain is shifting to the side. She's not sure if its just her getting old or not

## 2023-06-16 ENCOUNTER — Other Ambulatory Visit: Payer: Self-pay | Admitting: Medical

## 2023-06-16 ENCOUNTER — Ambulatory Visit: Payer: Commercial Managed Care - PPO | Admitting: Medical

## 2023-06-16 ENCOUNTER — Ambulatory Visit
Admission: RE | Admit: 2023-06-16 | Discharge: 2023-06-16 | Disposition: A | Discharge status: Home / Self Care | Payer: Commercial Managed Care - PPO | Source: Ambulatory Visit | Attending: Medical | Admitting: Medical

## 2023-06-16 VITALS — BP 120/70 | HR 84 | Wt 251.6 lb

## 2023-06-16 DIAGNOSIS — Z6841 Body Mass Index (BMI) 40.0 and over, adult: Secondary | ICD-10-CM

## 2023-06-16 DIAGNOSIS — M25551 Pain in right hip: Secondary | ICD-10-CM | POA: Diagnosis not present

## 2023-06-16 NOTE — Patient Instructions (Signed)
Please go to [facility] for your right hip xray.   Their hours are 8am - 4:30 pm Monday - Friday.  Take your insurance card with you.  [facility] [phone]   [address] [place], [place] [address]

## 2023-06-16 NOTE — Progress Notes (Signed)
Subjective:  Rebecca Soto is a 52 y.o. female who presents for Chief Complaint  Patient presents with   Hip Pain    Right Hip pain been going on since October     Been doing  more walking and exercise, but lately having pains in right hip.  No fall, trauma or injury.  Getting up and down hurts.   Aching fairly regularly.   Pains stays in hips.  No radiating pain, no numbness, tingling or weakness in leg.  No particular home treatment.    Regarding weight loss efforts, she just finished 2.5 mg Zepbound dose.  Ready to go to the next higher dose.  No other aggravating or relieving factors.    No other c/o.  Past Medical History:  Diagnosis Date   Allergy    Asthma    Blood transfusion without reported diagnosis    Goiter    Herpes labialis 08/22/2017   Hyperlipidemia    Hypertension 2019   Current Outpatient Medications on File Prior to Visit  Medication Sig Dispense Refill   albuterol (VENTOLIN HFA) 108 (90 Base) MCG/ACT inhaler TAKE 2 PUFFS BY MOUTH EVERY 6 HOURS AS NEEDED FOR WHEEZE OR SHORTNESS OF BREATH 6.7 each 2   amLODipine (NORVASC) 5 MG tablet Take 1 tablet (5 mg total) by mouth daily. 90 tablet 3   Cholecalciferol (VITAMIN D) 50 MCG (2000 UT) CAPS Take 1 capsule (2,000 Units total) by mouth daily. 90 capsule 3   fluticasone-salmeterol (WIXELA INHUB) 250-50 MCG/ACT AEPB INHALE 1 PUFF INTO THE LUNGS IN THE MORNING AND AT BEDTIME. 60 each 1   ibuprofen (ADVIL) 600 MG tablet Take 1 tablet by mouth every 8 (eight) hours as needed.     levothyroxine (SYNTHROID) 50 MCG tablet Take 1 tablet (50 mcg total) by mouth daily. 90 tablet 3   montelukast (SINGULAIR) 10 MG tablet TAKE 1 TABLET BY MOUTH EVERYDAY AT BEDTIME 90 tablet 3   rosuvastatin (CRESTOR) 10 MG tablet Take 1 tablet (10 mg total) by mouth daily. 90 tablet 3   tirzepatide (ZEPBOUND) 5 MG/0.5ML Pen Inject 5 mg into the skin once a week. 2 mL 1   valACYclovir (VALTREX) 1000 MG tablet TAKE 2 TABLETS BY MOUTH EVERY  12HOURS X 1 DAY. START ASAP AT ONSET OF SYMPTOMS. 30 tablet 1   Vitamin D, Ergocalciferol, (DRISDOL) 1.25 MG (50000 UNIT) CAPS capsule Take 1 capsule (50,000 Units total) by mouth every 7 (seven) days. 12 capsule 3   No current facility-administered medications on file prior to visit.   Past Surgical History:  Procedure Laterality Date   ABDOMINAL HYSTERECTOMY  2008   still has ovaries, fibroids   COLONOSCOPY     TOTAL ABDOMINAL HYSTERECTOMY  2008     The following portions of the patient's history were reviewed and updated as appropriate: allergies, current medications, past family history, past medical history, past social history, past surgical history and problem list.  ROS Otherwise as in subjective above  Objective: BP 120/70   Pulse 84   Wt 251 lb 9.6 oz (114.1 kg)   BMI 41.23 kg/m   General appearance: alert, no distress, well developed, well nourished Mildly tender in the right trochanteric bursa, more tender right in the right hip somewhat lateral and posterior lateral, no pelvic tenderness otherwise, range of motion fairly full but there is some pain with internal and external range of motion.  Rest the leg nontender with normal range of motion.  Left leg unremarkable Legs neurovascularly intact  Assessment: Encounter Diagnoses  Name Primary?   Right hip pain Yes   BMI 40.0-44.9, adult (HCC)      Plan: Right hip pain-possible arthritis.  Go for baseline x-ray.  Can use Tylenol or Aleve as needed.  Continue with stretching and working on flexibility.  Short-term back off activity in case some inflammation then gradually build back up to exercise as you are doing  Continue efforts at weight loss through healthy diet and exercise and go to the Zepbound 5 mg this month, and then call back before you finish this months dose so I can send in the 7.5 mg dose   [name] was seen today for hip pain.  Diagnoses and all orders for this visit:  Right hip pain -     DG  Hip Unilat W OR W/O Pelvis Min 4 Views Right; Future  BMI 40.0-44.9, adult (HCC)    Follow up: Pending x-ray

## 2023-06-24 NOTE — Telephone Encounter (Signed)
Called pharmacy & Zepbound went thru ins for $35, called pt & left message

## 2023-06-30 NOTE — Progress Notes (Signed)
Results sent through MyChart

## 2023-07-03 ENCOUNTER — Encounter: Payer: Self-pay | Admitting: Internal Medicine

## 2023-07-03 ENCOUNTER — Other Ambulatory Visit: Payer: Self-pay | Admitting: Internal Medicine

## 2023-07-03 DIAGNOSIS — M25551 Pain in right hip: Secondary | ICD-10-CM

## 2023-07-05 NOTE — Telephone Encounter (Signed)
Pt was switched to Zepbound & approved

## 2023-07-21 ENCOUNTER — Telehealth: Payer: Self-pay | Admitting: Medical

## 2023-07-21 ENCOUNTER — Encounter: Payer: Self-pay | Admitting: Medical

## 2023-07-21 DIAGNOSIS — M1611 Unilateral primary osteoarthritis, right hip: Secondary | ICD-10-CM | POA: Insufficient documentation

## 2023-07-21 NOTE — Telephone Encounter (Signed)
PT recommend  stand up work station, her work has those but she would need a note for it

## 2023-07-21 NOTE — Telephone Encounter (Signed)
Notified patient and emailed rx/order for stand up desk

## 2023-08-08 ENCOUNTER — Other Ambulatory Visit (INDEPENDENT_AMBULATORY_CARE_PROVIDER_SITE_OTHER): Payer: Commercial Managed Care - PPO

## 2023-08-08 ENCOUNTER — Encounter: Payer: Self-pay | Admitting: Medical

## 2023-08-08 ENCOUNTER — Telehealth: Payer: Commercial Managed Care - PPO | Admitting: Medical

## 2023-08-08 VITALS — BP 126/82 | HR 86 | Temp 101.0°F | Ht 65.0 in | Wt 251.8 lb

## 2023-08-08 DIAGNOSIS — R6889 Other general symptoms and signs: Secondary | ICD-10-CM | POA: Diagnosis not present

## 2023-08-08 DIAGNOSIS — Z6841 Body Mass Index (BMI) 40.0 and over, adult: Secondary | ICD-10-CM

## 2023-08-08 DIAGNOSIS — R0989 Other specified symptoms and signs involving the circulatory and respiratory systems: Secondary | ICD-10-CM | POA: Diagnosis not present

## 2023-08-08 DIAGNOSIS — R059 Cough, unspecified: Secondary | ICD-10-CM

## 2023-08-08 DIAGNOSIS — J4540 Moderate persistent asthma, uncomplicated: Secondary | ICD-10-CM

## 2023-08-08 DIAGNOSIS — R0981 Nasal congestion: Secondary | ICD-10-CM | POA: Diagnosis not present

## 2023-08-08 DIAGNOSIS — J45909 Unspecified asthma, uncomplicated: Secondary | ICD-10-CM | POA: Insufficient documentation

## 2023-08-08 DIAGNOSIS — I10 Essential (primary) hypertension: Secondary | ICD-10-CM

## 2023-08-08 LAB — POCT INFLUENZA A/B
Influenza A, POC: NEGATIVE
Influenza B, POC: NEGATIVE

## 2023-08-08 MED ORDER — ZEPBOUND 7.5 MG/0.5ML ~~LOC~~ SOAJ: 7.5000 mg | SUBCUTANEOUS | 0 refills | Status: DC

## 2023-08-08 MED ORDER — ALBUTEROL SULFATE HFA 108 (90 BASE) MCG/ACT IN AERS: INHALATION_SPRAY | RESPIRATORY_TRACT | 2 refills | Status: DC

## 2023-08-08 MED ORDER — HYDROCODONE BIT-HOMATROP MBR 5-1.5 MG/5ML PO SOLN: 5.0000 mL | Freq: Three times a day (TID) | ORAL | 0 refills | Status: AC | PRN

## 2023-08-08 NOTE — Progress Notes (Signed)
Subjective:     Patient ID: Rebecca Soto, female   DOB: Dec 17, 1970, 53 y.o.   MRN: [id number]  This visit type was conducted due to national recommendations for restrictions regarding the COVID-19 Pandemic (e.g. social distancing) in an effort to limit this patient's exposure and mitigate transmission in our community.  Due to their co-morbid illnesses, this patient is at least at moderate risk for complications without adequate follow up.  This format is felt to be most appropriate for this patient at this time.    Documentation for virtual audio and video telecommunications through [facility] encounter:  The patient was located at home. The provider was located in the office. The patient did consent to this visit and is aware of possible charges through their insurance for this visit.  The other persons participating in this telemedicine service were none. Time spent on call was 20 minutes and in review of previous records 20 minutes total.  This virtual service is not related to other E/M service within previous 7 days.   HPI Chief Complaint  Patient presents with   URI    Cough with clear mucus, congestion (nose and chest), and sore throat. Covid negative yesterday.    Virtual consult for illness x 2 days.   She reports congested in chest, cough, sore throat, head congestion.  Swollen in nostrils, 101 fever.  Always cold, so no new chills.   No body aches.  Has headaches.    No NVD.  Asthma is flared up a bit.  Using inhaler some.  Lots of sickness at work.   Did home covid test that was negative.   Using coricidein HBP.  Using some tylenol.  No other aggravating or relieving factors.   Needs refill on albuterol  Also needs refill on next dose of zepbound for weight loss.  Doing well on medication.   No other complaint.  Past Medical History:  Diagnosis Date   Allergy    Asthma    Blood transfusion without reported diagnosis    Goiter    Herpes labialis 08/22/2017    Hyperlipidemia    Hypertension 2019   Current Outpatient Medications on File Prior to Visit  Medication Sig Dispense Refill   amLODipine (NORVASC) 5 MG tablet Take 1 tablet (5 mg total) by mouth daily. 90 tablet 3   Cholecalciferol (VITAMIN D) 50 MCG (2000 UT) CAPS Take 1 capsule (2,000 Units total) by mouth daily. 90 capsule 3   fluticasone-salmeterol (WIXELA INHUB) 250-50 MCG/ACT AEPB INHALE 1 PUFF INTO THE LUNGS IN THE MORNING AND AT BEDTIME. 60 each 1   ibuprofen (ADVIL) 600 MG tablet Take 1 tablet by mouth every 8 (eight) hours as needed.     levothyroxine (SYNTHROID) 50 MCG tablet Take 1 tablet (50 mcg total) by mouth daily. 90 tablet 3   montelukast (SINGULAIR) 10 MG tablet TAKE 1 TABLET BY MOUTH EVERYDAY AT BEDTIME 90 tablet 3   rosuvastatin (CRESTOR) 10 MG tablet Take 1 tablet (10 mg total) by mouth daily. 90 tablet 3   Vitamin D, Ergocalciferol, (DRISDOL) 1.25 MG (50000 UNIT) CAPS capsule Take 1 capsule (50,000 Units total) by mouth every 7 (seven) days. 12 capsule 3   valACYclovir (VALTREX) 1000 MG tablet TAKE 2 TABLETS BY MOUTH EVERY 12HOURS X 1 DAY. START ASAP AT ONSET OF SYMPTOMS. (Patient not taking: Reported on 08/08/2023) 30 tablet 1   No current facility-administered medications on file prior to visit.      Review of Systems As in  subjective    Objective:   Physical Exam Due to coronavirus pandemic stay at home measures, patient visit was virtual and they were not examined in person.   BP 126/82   Pulse 86   Temp (!) 101 F (38.3 C) (Oral) Comment: patient reported Comment (Src): patient reported  Ht 5\' 5"  (1.651 m) Comment: patient reported  Wt 251 lb 12.8 oz (114.2 kg)   SpO2 98%   BMI 41.90 kg/m   Wt Readings from Last 3 Encounters:  08/08/23 251 lb 12.8 oz (114.2 kg)  06/16/23 251 lb 9.6 oz (114.1 kg)  05/14/23 251 lb 6.4 oz (114 kg)   In person exam:  General appearence: alert, no distress, WD/WN,  HEENT: normocephalic, sclerae anicteric, TMs flat,  nares with mild erythema, clear discharge , pharynx with mild erythema Oral cavity: MMM, no lesions Neck: supple, no lymphadenopathy, no thyromegaly, no masses Heart: RRR, normal S1, S2, no murmurs Lungs: CTA bilaterally, no wheezes, rhonchi, or rales Pulses: 2+ symmetric, upper and lower extremities, normal cap refill      Assessment:     Encounter Diagnoses  Name Primary?   Flu-like symptoms Yes   Cough, unspecified type    Moderate persistent asthma without complication    Essential hypertension, benign    BMI 40.0-44.9, adult (HCC)        Plan:     Medications prescribed today: Hycodan syrup for night time for worse cough  Specific home care recommendations today include: Only take over-the-counter (OTC) or prescription medicines for pain, discomfort, or fever as directed by your caregiver.   You can continue the Coricidin HBP for cough/congestion the next several days  Sore throat remedies:  You may use salt water gargles, warm fluids such as coffee or hot tea, or honey/tea/lemon mixture to sooth sore throat pain.  You may use OTC sore throat remedies such as Cepacol lozenges or Chloraseptic spray for sore throat pain. Runny nose and sneezing remedies: You may use OTC antihistamine such as Zyrtec or Benadryl, but caution as these can cause drowsiness.   Pain/fever relief: You may use over-the-counter Tylenol for pain or fever Drink extra fluids. Fluids help thin the mucus so your sinuses can drain more easily.  Applying either moist heat or ice packs to the sinus areas may help relieve discomfort. Use saline nasal sprays to help moisten your sinuses. The sprays can be found at your local drugstore.   Continue your Wixela maintenance inhaler, albuterol rescue inhaler as needed   Patient was advised to call or return if worse or not improving in the next few days.    Patient voiced understanding of diagnosis, recommendations, and treatment plan.   HTN - continue current  medication Amlodipine 5mg  daily  Obesity - increase to Zepbound 7.5mg  weekly dose  Recheck in 18month  Damary was seen today for uri.  Diagnoses and all orders for this visit:  Flu-like symptoms  Cough, unspecified type  Moderate persistent asthma without complication  Essential hypertension, benign  BMI 40.0-44.9, adult (HCC)  Other orders -     HYDROcodone bit-homatropine (HYCODAN) 5-1.5 MG/5ML syrup; Take 5 mLs by mouth every 8 (eight) hours as needed for up to 5 days for cough. -     tirzepatide (ZEPBOUND) 7.5 MG/0.5ML Pen; Inject 7.5 mg into the skin once a week. -     albuterol (VENTOLIN HFA) 108 (90 Base) MCG/ACT inhaler; TAKE 2 PUFFS BY MOUTH EVERY 6 HOURS AS NEEDED FOR WHEEZE OR SHORTNESS OF BREATH  F/u 1 month

## 2023-08-11 ENCOUNTER — Other Ambulatory Visit: Payer: Self-pay | Admitting: Medical

## 2023-08-11 ENCOUNTER — Encounter: Payer: Self-pay | Admitting: Medical

## 2023-08-11 ENCOUNTER — Telehealth: Payer: Self-pay | Admitting: Medical

## 2023-08-11 MED ORDER — AMOXICILLIN 875 MG PO TABS: 875.0000 mg | ORAL_TABLET | Freq: Two times a day (BID) | ORAL | 0 refills | Status: DC

## 2023-08-11 NOTE — Telephone Encounter (Signed)
Pt informed, letter typed & emailed

## 2023-08-11 NOTE — Telephone Encounter (Signed)
Pt called & states she is worse, coughing bad, now yellow phlegm, did run fever over weekend.  Chest heavy.  [facility]  Would like a note for work for tomorrow.

## 2023-10-23 ENCOUNTER — Other Ambulatory Visit: Payer: Self-pay | Admitting: Medical

## 2023-10-27 ENCOUNTER — Other Ambulatory Visit: Payer: Self-pay | Admitting: Medical

## 2023-10-27 NOTE — Telephone Encounter (Signed)
 Copied from CRM [id number]. Topic: Clinical - Medication Refill >> Oct 27, 2023  2:47 PM Carlatta H wrote: Most Recent Primary Care Visit:  Provider: [facility]  Department: [facility]  Visit Type: LAB  Date: 08/08/2023  Medication: tirzepatide (ZEPBOUND) 7.5 MG/0.5ML Pen [[id number]]  Has the patient contacted their pharmacy? No (Agent: If no, request that the patient contact the pharmacy for the refill. If patient does not wish to contact the pharmacy document the reason why and proceed with request.) (Agent: If yes, when and what did the pharmacy advise?)   Patient dosage is suppose to go up to next level  Is this the correct pharmacy for this prescription? Yes If no, delete pharmacy and type the correct one.  This is the patient's preferred pharmacy:  [id number] [place] [place] - [address] AT [facility] [address] [place] [address] Phone: [phone] Fax: [phone]   Has the prescription been filled recently? No  Is the patient out of the medication? Yes  Has the patient been seen for an appointment in the last year OR does the patient have an upcoming appointment? Yes  Can we respond through MyChart? Yes  Agent: Please be advised that Rx refills may take up to 3 business days. We ask that you follow-up with your pharmacy.

## 2023-10-30 MED ORDER — ZEPBOUND 7.5 MG/0.5ML ~~LOC~~ SOAJ
7.5000 mg | SUBCUTANEOUS | 0 refills | Status: DC
Start: 2023-10-30 — End: 2023-12-24

## 2023-11-10 ENCOUNTER — Encounter: Admitting: Medical

## 2023-11-17 ENCOUNTER — Encounter: Payer: Self-pay | Admitting: Medical

## 2023-11-17 ENCOUNTER — Ambulatory Visit: Admitting: Medical

## 2023-11-17 VITALS — BP 110/82 | HR 84 | Wt 246.4 lb

## 2023-11-17 DIAGNOSIS — I10 Essential (primary) hypertension: Secondary | ICD-10-CM | POA: Diagnosis not present

## 2023-11-17 DIAGNOSIS — Z6841 Body Mass Index (BMI) 40.0 and over, adult: Secondary | ICD-10-CM | POA: Diagnosis not present

## 2023-11-17 DIAGNOSIS — B351 Tinea unguium: Secondary | ICD-10-CM | POA: Diagnosis not present

## 2023-11-17 DIAGNOSIS — E039 Hypothyroidism, unspecified: Secondary | ICD-10-CM

## 2023-11-17 DIAGNOSIS — E559 Vitamin D deficiency, unspecified: Secondary | ICD-10-CM

## 2023-11-17 MED ORDER — ZEPBOUND 10 MG/0.5ML ~~LOC~~ SOAJ: 10.0000 mg | SUBCUTANEOUS | 0 refills | Status: DC

## 2023-11-17 MED ORDER — TERBINAFINE HCL 250 MG PO TABS: 250.0000 mg | ORAL_TABLET | Freq: Every day | ORAL | 0 refills | Status: DC

## 2023-11-17 MED ORDER — ZEPBOUND 12.5 MG/0.5ML ~~LOC~~ SOAJ: 12.5000 mg | SUBCUTANEOUS | 0 refills | Status: DC

## 2023-11-17 NOTE — Progress Notes (Signed)
 Subjective:  Rebecca Soto is a 53 y.o. female who presents for Chief Complaint  Patient presents with   Follow-up    Med mgmt. Follow up on meds. Wants to look at her toe nail.       Here for follow up on weight loss efforts  Current exercise: 3 days per week at gym, weights, some treadmill  Dietary efforts: Has tried to cut back on most meat except fish.   Needs to increase vegetables  Medication: Zepbound  currently on 7.5mg , has one more week on this.  Any side effects of medication: none  She is compliant with vitamin D  but she is actually taking the 50,000 weekly plus daily over-the-counter 1000 units.  She is compliant with her thyroid  medicine without complaint, compliant with her blood pressure medicine without complaint  She has been concerned about her toenail.  Her right great toenail has a weird shape and look to it and aches little bit.  No other aggravating or relieving factors.    No other c/o.  Past Medical History:  Diagnosis Date   Allergy     Asthma    Blood transfusion without reported diagnosis    Goiter    Herpes labialis 08/22/2017   Hyperlipidemia    Hypertension 2019    Current Outpatient Medications on File Prior to Visit  Medication Sig Dispense Refill   albuterol  (VENTOLIN  HFA) 108 (90 Base) MCG/ACT inhaler TAKE 2 PUFFS BY MOUTH EVERY 6 HOURS AS NEEDED FOR WHEEZE OR SHORTNESS OF BREATH 6.7 each 2   amLODipine  (NORVASC ) 5 MG tablet Take 1 tablet (5 mg total) by mouth daily. 90 tablet 3   Cholecalciferol (VITAMIN D ) 50 MCG (2000 UT) CAPS Take 1 capsule (2,000 Units total) by mouth daily. 90 capsule 3   levothyroxine  (SYNTHROID ) 50 MCG tablet Take 1 tablet (50 mcg total) by mouth daily. 90 tablet 3   montelukast  (SINGULAIR ) 10 MG tablet TAKE 1 TABLET BY MOUTH EVERYDAY AT BEDTIME 90 tablet 3   rosuvastatin  (CRESTOR ) 10 MG tablet Take 1 tablet (10 mg total) by mouth daily. 90 tablet 3   tirzepatide  (ZEPBOUND ) 7.5 MG/0.5ML Pen Inject 7.5  mg into the skin once a week. 2 mL 0   Vitamin D , Ergocalciferol , (DRISDOL ) 1.25 MG (50000 UNIT) CAPS capsule Take 1 capsule (50,000 Units total) by mouth every 7 (seven) days. 12 capsule 3   WIXELA INHUB 250-50 MCG/ACT AEPB INHALE 1 PUFF INTO THE LUNGS IN THE MORNING AND AT BEDTIME. 60 each 1   amoxicillin  (AMOXIL ) 875 MG tablet Take 1 tablet (875 mg total) by mouth 2 (two) times daily. (Patient not taking: Reported on 11/17/2023) 20 tablet 0   ibuprofen (ADVIL) 600 MG tablet Take 1 tablet by mouth every 8 (eight) hours as needed. (Patient not taking: Reported on 11/17/2023)     valACYclovir  (VALTREX ) 1000 MG tablet TAKE 2 TABLETS BY MOUTH EVERY 12HOURS X 1 DAY. START ASAP AT ONSET OF SYMPTOMS. (Patient not taking: Reported on 11/17/2023) 30 tablet 1   No current facility-administered medications on file prior to visit.    The following portions of the patient's history were reviewed and updated as appropriate: allergies, current medications, past family history, past medical history, past social history, past surgical history and problem list.  ROS Otherwise as in subjective above   Objective: BP 110/82   Pulse 84   Wt 246 lb 6.4 oz (111.8 kg)   BMI 41.00 kg/m   General appearance: alert, no distress, well developed, well  nourished Right toenail medially with some lifting of the nail and some tenderness but no ingrowing nail.  There is some discoloration to other nails as well but mainly the right great toenail Legs neurovascularly intact    Assessment: Encounter Diagnoses  Name Primary?   Vitamin D  deficiency Yes   BMI 40.0-44.9, adult (HCC)    Onychomycosis    Essential hypertension, benign    Hypothyroidism, unspecified type      Plan: Vitamin D  deficiency-updated labs today.  we will likely just have her do the weekly prescription vitamin D  only.  Follow-up pending labs  BMI 41-increase to Zepbound  10 mg for 1 month then go up to 12.5 mg.  Counseled on diet and exercise.   Increase aerobic exercise.  Try to limit calories to 1800/day or less  Onychomycosis-discussed findings, possible treatments.  She agrees to trial of Lamisil oral medication.  Recheck in 1 month  Hypertension-continue amlodipine  5 mg daily  Hypothyroidism-continue levothyroxine  50 mcg daily   Zoella was seen today for follow-up.  Diagnoses and all orders for this visit:  Vitamin D  deficiency -     Basic metabolic panel with GFR -     VITAMIN D  25 Hydroxy (Vit-D Deficiency, Fractures)  BMI 40.0-44.9, adult (HCC)  Onychomycosis  Essential hypertension, benign  Hypothyroidism, unspecified type  Other orders -     terbinafine (LAMISIL) 250 MG tablet; Take 1 tablet (250 mg total) by mouth daily. -     tirzepatide  (ZEPBOUND ) 10 MG/0.5ML Pen; Inject 10 mg into the skin once a week. -     tirzepatide  (ZEPBOUND ) 12.5 MG/0.5ML Pen; Inject 12.5 mg into the skin once a week.    Follow up: 4 to 6 weeks

## 2023-11-18 ENCOUNTER — Other Ambulatory Visit: Payer: Self-pay | Admitting: Medical

## 2023-11-18 LAB — BASIC METABOLIC PANEL WITH GFR
BUN/Creatinine Ratio: 10 (ref 9–23)
BUN: 9 mg/dL (ref 6–24)
CO2: 19 mmol/L — ABNORMAL LOW (ref 20–29)
Calcium: 9.2 mg/dL (ref 8.7–10.2)
Chloride: 105 mmol/L (ref 96–106)
Creatinine, Ser: 0.86 mg/dL (ref 0.57–1.00)
Glucose: 89 mg/dL (ref 70–99)
Potassium: 4.3 mmol/L (ref 3.5–5.2)
Sodium: 139 mmol/L (ref 134–144)
eGFR: 81 mL/min/{1.73_m2} (ref >59)

## 2023-11-18 LAB — VITAMIN D 25 HYDROXY (VIT D DEFICIENCY, FRACTURES): Vit D, 25-Hydroxy: 62.1 ng/mL (ref 30.0–100.0)

## 2023-11-18 NOTE — Progress Notes (Signed)
 Results sent through MyChart

## 2023-11-21 ENCOUNTER — Telehealth: Payer: Self-pay | Admitting: Internal Medicine

## 2023-11-21 NOTE — Telephone Encounter (Signed)
 Sent mychart message back to patient

## 2023-11-21 NOTE — Telephone Encounter (Unsigned)
 Copied from CRM [id number]. Topic: General - Other >> Nov 21, 2023  9:33 AM Rebecca Soto wrote: Reason for CRM: Patient needs another note from the provider to get approved for a standing work station. Patient would like to know if she can have that emailed over to her work email this time, [email or web address].

## 2023-11-25 ENCOUNTER — Encounter: Payer: Self-pay | Admitting: Medical

## 2023-12-19 ENCOUNTER — Other Ambulatory Visit: Payer: Self-pay | Admitting: Medical

## 2023-12-19 NOTE — Telephone Encounter (Signed)
 Pt has an appt next week

## 2023-12-22 ENCOUNTER — Ambulatory Visit: Admitting: Medical

## 2023-12-24 ENCOUNTER — Ambulatory Visit: Admitting: Medical

## 2023-12-24 ENCOUNTER — Encounter: Payer: Self-pay | Admitting: Medical

## 2023-12-24 VITALS — BP 112/68 | HR 99 | Ht 65.0 in | Wt 251.0 lb

## 2023-12-24 DIAGNOSIS — B351 Tinea unguium: Secondary | ICD-10-CM

## 2023-12-24 DIAGNOSIS — E559 Vitamin D deficiency, unspecified: Secondary | ICD-10-CM

## 2023-12-24 DIAGNOSIS — Z79899 Other long term (current) drug therapy: Secondary | ICD-10-CM | POA: Diagnosis not present

## 2023-12-24 DIAGNOSIS — I10 Essential (primary) hypertension: Secondary | ICD-10-CM

## 2023-12-24 DIAGNOSIS — Z6841 Body Mass Index (BMI) 40.0 and over, adult: Secondary | ICD-10-CM

## 2023-12-24 MED ORDER — TERBINAFINE HCL 250 MG PO TABS: 250.0000 mg | ORAL_TABLET | Freq: Every day | ORAL | 0 refills | Status: DC

## 2023-12-24 MED ORDER — TIRZEPATIDE-WEIGHT MANAGEMENT 15 MG/0.5ML ~~LOC~~ SOAJ: 15.0000 mg | SUBCUTANEOUS | 2 refills | Status: DC

## 2023-12-24 NOTE — Progress Notes (Signed)
 Subjective:  Rebecca Soto is a 53 y.o. female who presents for Chief Complaint  Patient presents with   Follow-up    Follow up on wt loss injection and nail fungus and needs bloodwork.      Here for follow up on weight loss efforts and toenail fungus.  Last visit we started Lamisil  for toenails and she sees good improvement.  Current exercise: 3 days per week at gym, weights, some treadmill  Dietary efforts: Has tried to cut back on most meat except fish.   Needs to increase vegetables  Medication: Zepbound  currently on 10mg , ready to move up to 12.5mg  dose.  Any side effects of medication: none  She is compliant with vitamin D  but she is actually taking the 50,000 weekly plus daily over-the-counter 1000 units.  She is compliant with her thyroid  medicine without complaint, compliant with her blood pressure medicine without complaint  No other aggravating or relieving factors.    No other c/o.  Past Medical History:  Diagnosis Date   Allergy     Asthma    Blood transfusion without reported diagnosis    Goiter    Herpes labialis 08/22/2017   Hyperlipidemia    Hypertension 2019    Current Outpatient Medications on File Prior to Visit  Medication Sig Dispense Refill   albuterol  (VENTOLIN  HFA) 108 (90 Base) MCG/ACT inhaler TAKE 2 PUFFS BY MOUTH EVERY 6 HOURS AS NEEDED FOR WHEEZE OR SHORTNESS OF BREATH 6.7 each 2   amLODipine  (NORVASC ) 5 MG tablet Take 1 tablet (5 mg total) by mouth daily. 90 tablet 3   Cholecalciferol (VITAMIN D ) 50 MCG (2000 UT) CAPS Take 1 capsule (2,000 Units total) by mouth daily. 90 capsule 3   levothyroxine  (SYNTHROID ) 50 MCG tablet Take 1 tablet (50 mcg total) by mouth daily. 90 tablet 3   montelukast  (SINGULAIR ) 10 MG tablet TAKE 1 TABLET BY MOUTH EVERYDAY AT BEDTIME 90 tablet 3   rosuvastatin  (CRESTOR ) 10 MG tablet Take 1 tablet (10 mg total) by mouth daily. 90 tablet 3   tirzepatide  (ZEPBOUND ) 12.5 MG/0.5ML Pen Inject 12.5 mg into the skin  once a week. 2 mL 0   WIXELA INHUB 250-50 MCG/ACT AEPB INHALE 1 PUFF INTO THE LUNGS IN THE MORNING AND AT BEDTIME. 60 each 1   ibuprofen (ADVIL) 600 MG tablet Take 1 tablet by mouth every 8 (eight) hours as needed. (Patient not taking: Reported on 12/24/2023)     valACYclovir  (VALTREX ) 1000 MG tablet TAKE 2 TABLETS BY MOUTH EVERY 12HOURS X 1 DAY. START ASAP AT ONSET OF SYMPTOMS. (Patient not taking: Reported on 12/24/2023) 30 tablet 1   Vitamin D , Ergocalciferol , (DRISDOL ) 1.25 MG (50000 UNIT) CAPS capsule Take 1 capsule (50,000 Units total) by mouth every 7 (seven) days. (Patient not taking: Reported on 12/24/2023) 12 capsule 3   No current facility-administered medications on file prior to visit.    The following portions of the patient's history were reviewed and updated as appropriate: allergies, current medications, past family history, past medical history, past social history, past surgical history and problem list.  ROS Otherwise as in subjective above     Objective: BP 112/68   Pulse 99   Ht 5\' 5"  (1.651 m)   Wt 251 lb (113.9 kg)   SpO2 98%   BMI 41.77 kg/m   Wt Readings from Last 3 Encounters:  12/24/23 251 lb (113.9 kg)  11/17/23 246 lb 6.4 oz (111.8 kg)  08/08/23 251 lb 12.8 oz (114.2 kg)  BP Readings from Last 3 Encounters:  12/24/23 112/68  11/17/23 110/82  08/08/23 126/82    General appearance: alert, no distress, well developed, well nourished Right toenails particularly great toenail with 1/2 nail appearing normal,  the fungus discolored toenail growing out.  Legs neurovascularly intact    Assessment: Encounter Diagnoses  Name Primary?   High risk medication use Yes   Onychomycosis    Essential hypertension, benign    BMI 40.0-44.9, adult (HCC)    Vitamin D  deficiency       Plan: Onychomycosis-good improvement seen with Lamisil .  Lab today and continue another 3-4 weeks of Lamisil   Vitamin D  deficiency-continue weekly prescription vitamin D  only.     BMI 40-increase to Zepbound  12.5 mg for 1 month then go up to 15 mg.  Counseled on diet and exercise.  Increase aerobic exercise.  Try to limit calories to 1800/day or less  Hypertension-continue amlodipine  5 mg daily  Hypothyroidism-continue levothyroxine  50 mcg daily   Rebecca Soto was seen today for follow-up.  Diagnoses and all orders for this visit:  High risk medication use -     Comprehensive metabolic panel with GFR  Onychomycosis  Essential hypertension, benign  BMI 40.0-44.9, adult (HCC)  Vitamin D  deficiency  Other orders -     tirzepatide  (ZEPBOUND ) 15 MG/0.5ML Pen; Inject 15 mg into the skin once a week. -     terbinafine  (LAMISIL ) 250 MG tablet; Take 1 tablet (250 mg total) by mouth daily.    Follow up: call report in 53mo

## 2023-12-25 ENCOUNTER — Ambulatory Visit: Payer: Self-pay | Admitting: Medical

## 2023-12-25 LAB — COMPREHENSIVE METABOLIC PANEL WITH GFR
ALT: 15 IU/L (ref 0–32)
AST: 13 IU/L (ref 0–40)
Albumin: 4.6 g/dL (ref 3.8–4.9)
Alkaline Phosphatase: 89 IU/L (ref 44–121)
BUN/Creatinine Ratio: 8 — ABNORMAL LOW (ref 9–23)
BUN: 7 mg/dL (ref 6–24)
Bilirubin Total: 0.3 mg/dL (ref 0.0–1.2)
CO2: 20 mmol/L (ref 20–29)
Calcium: 9.3 mg/dL (ref 8.7–10.2)
Chloride: 104 mmol/L (ref 96–106)
Creatinine, Ser: 0.89 mg/dL (ref 0.57–1.00)
Globulin, Total: 2.6 g/dL (ref 1.5–4.5)
Glucose: 77 mg/dL (ref 70–99)
Potassium: 4 mmol/L (ref 3.5–5.2)
Sodium: 139 mmol/L (ref 134–144)
Total Protein: 7.2 g/dL (ref 6.0–8.5)
eGFR: 77 mL/min/{1.73_m2} (ref >59)

## 2023-12-25 NOTE — Progress Notes (Signed)
 Results sent through MyChart

## 2023-12-29 ENCOUNTER — Other Ambulatory Visit: Payer: Self-pay | Admitting: Medical

## 2024-01-14 ENCOUNTER — Other Ambulatory Visit (HOSPITAL_COMMUNITY): Payer: Self-pay

## 2024-01-14 ENCOUNTER — Telehealth: Payer: Self-pay

## 2024-01-14 NOTE — Telephone Encounter (Addendum)
 Pharmacy Patient Advocate Encounter   Received notification from CoverMyMeds that prior authorization for Zepbound  15MG /0.5ML pen-injectors is required/requested.   Insurance verification completed.   The patient is insured through [facility] .   Per test claim: PA required; PA submitted to above mentioned insurance via CoverMyMeds Key/confirmation #/EOC (Key: [id number]   Status is pending

## 2024-01-16 ENCOUNTER — Other Ambulatory Visit (HOSPITAL_COMMUNITY): Payer: Self-pay

## 2024-01-19 ENCOUNTER — Other Ambulatory Visit (HOSPITAL_COMMUNITY): Payer: Self-pay

## 2024-01-19 NOTE — Telephone Encounter (Signed)
 Pharmacy Patient Advocate Encounter  Received notification from [facility] that Prior Authorization for Zepbound  15MG /0.5ML pen-injectors has been APPROVED  to 6.29.26. Ran test claim, Copay is $35.00. This test claim was processed through [facility]- copay amounts may vary at other pharmacies due to pharmacy/plan contracts, or as the patient moves through the different stages of their insurance plan.   PA #/Case ID/Reference #: (Key: [id number])

## 2024-02-12 ENCOUNTER — Ambulatory Visit: Payer: Self-pay

## 2024-02-12 NOTE — Telephone Encounter (Signed)
 FYI Only or Action Required?: FYI only for provider.  Patient was last seen in primary care on 12/24/2023 by Bulah Alm RAMAN, PA-C.  Called Nurse Triage reporting Cough.  Symptoms began 1-2 days ago.  Interventions attempted: Has used her inhaler as well as OTC cough medications.  Symptoms are: unchanged.  Triage Disposition: See Physician Within 24 Hours  Patient/caregiver understands and will follow disposition?: Yes     Copied from CRM #[id number]. Topic: Clinical - Red Word Triage >> Feb 12, 2024 11:25 AM Berwyn MATSU wrote: Red Word that prompted transfer to Nurse Triage: pain in back, sweating, lots of coughing, and shortness of breath patient has asthma.     Reason for Disposition  [1] Known COPD or other severe lung disease (i.e., bronchiectasis, cystic fibrosis, lung surgery) AND [2] symptoms getting worse (i.e., increased sputum purulence or amount, increased breathing difficulty  Answer Assessment - Initial Assessment Questions 1. ONSET: When did the cough begin?      1-2 days ago 2. SEVERITY: How bad is the cough today?      Moderate  3. SPUTUM: Describe the color of your sputum (e.g., none, dry cough; clear, white, yellow, green)     Yellow 4. HEMOPTYSIS: Are you coughing up any blood? If Yes, ask: How much? (e.g., flecks, streaks, tablespoons, etc.)     No 5. DIFFICULTY BREATHING: Are you having difficulty breathing? If Yes, ask: How bad is it? (e.g., mild, moderate, severe)      Some increased shortness of breath  6. FEVER: Do you have a fever? If Yes, ask: What is your temperature, how was it measured, and when did it start?     Has not been able to check  7. CARDIAC HISTORY: Do you have any history of heart disease? (e.g., heart attack, congestive heart failure)      No 8. LUNG HISTORY: Do you have any history of lung disease?  (e.g., pulmonary embolus, asthma, emphysema)     Asthma  9. PE RISK FACTORS: Do you have a history of blood  clots? (or: recent major surgery, recent prolonged travel, bedridden)     No 10. OTHER SYMPTOMS: Do you have any other symptoms? (e.g., runny nose, wheezing, chest pain)       Sweaty, increased urination, back pain  Protocols used: Cough - Acute Productive-A-AH

## 2024-02-13 ENCOUNTER — Ambulatory Visit: Admitting: Medical

## 2024-02-13 ENCOUNTER — Encounter: Payer: Self-pay | Admitting: Medical

## 2024-02-13 VITALS — BP 124/80 | HR 94 | Temp 98.2°F | Ht 65.0 in | Wt 240.6 lb

## 2024-02-13 DIAGNOSIS — J4540 Moderate persistent asthma, uncomplicated: Secondary | ICD-10-CM

## 2024-02-13 DIAGNOSIS — J988 Other specified respiratory disorders: Secondary | ICD-10-CM

## 2024-02-13 DIAGNOSIS — R509 Fever, unspecified: Secondary | ICD-10-CM | POA: Diagnosis not present

## 2024-02-13 DIAGNOSIS — R051 Acute cough: Secondary | ICD-10-CM

## 2024-02-13 MED ORDER — FLUTICASONE-SALMETEROL 250-50 MCG/ACT IN AEPB: 1.0000 | INHALATION_SPRAY | Freq: Two times a day (BID) | RESPIRATORY_TRACT | 11 refills | Status: AC

## 2024-02-13 MED ORDER — BENZONATATE 200 MG PO CAPS
200.0000 mg | ORAL_CAPSULE | Freq: Three times a day (TID) | ORAL | 0 refills | Status: DC | PRN
Start: 2024-02-13 — End: 2024-05-06

## 2024-02-13 MED ORDER — ALBUTEROL SULFATE (2.5 MG/3ML) 0.083% IN NEBU: 2.5000 mg | INHALATION_SOLUTION | Freq: Four times a day (QID) | RESPIRATORY_TRACT | 4 refills | Status: AC | PRN

## 2024-02-13 MED ORDER — AZITHROMYCIN 250 MG PO TABS
ORAL_TABLET | ORAL | 0 refills | Status: DC
Start: 2024-02-13 — End: 2024-04-02

## 2024-02-13 MED ORDER — ALBUTEROL SULFATE HFA 108 (90 BASE) MCG/ACT IN AERS: INHALATION_SPRAY | RESPIRATORY_TRACT | 2 refills | Status: AC

## 2024-02-13 NOTE — Progress Notes (Signed)
 Subjective:  Rebecca Soto is a 53 y.o. female who presents for Chief Complaint  Patient presents with   other    Cough started Tuesday- yellow mucous, headache, deep breaths cause pain in upper back     Here for cough, respiratory infection x 4 days.  Symptoms include cough, congestion, headache, some dyspnea, pain in upper back No sore throat, some sweats, possible fever.   No NVD.  Has sick contacts at work.   Did 2 at home at covid test that was negative. Using rescue inhaler 2 times daily currently.  Using Wixela as usual.    Needs refill on albuterol  respules for neb  No other aggravating or relieving factors.    No other c/o.  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 90 tablet 3   Cholecalciferol (VITAMIN D ) 50 MCG (2000 UT) CAPS Take 1 capsule (2,000 Units total) by mouth daily. 90 capsule 3   ibuprofen (ADVIL) 600 MG tablet Take 1 tablet by mouth every 8 (eight) hours as needed.     levothyroxine  (SYNTHROID ) 50 MCG tablet Take 1 tablet (50 mcg total) by mouth daily. 90 tablet 3   montelukast  (SINGULAIR ) 10 MG tablet TAKE 1 TABLET BY MOUTH EVERYDAY AT BEDTIME 90 tablet 3   rosuvastatin  (CRESTOR ) 10 MG tablet Take 1 tablet (10 mg total) by mouth daily. 90 tablet 3   tirzepatide  (ZEPBOUND ) 12.5 MG/0.5ML Pen Inject 12.5 mg into the skin once a week. 2 mL 0   valACYclovir  (VALTREX ) 1000 MG tablet TAKE 2 TABLETS BY MOUTH EVERY 12HOURS X 1 DAY. START ASAP AT ONSET OF SYMPTOMS. (Patient taking differently: Take 500 mg by mouth as needed. TAKE 2 TABLETS BY MOUTH EVERY 12HOURS X 1 DAY. START ASAP AT ONSET OF SYMPTOMS.) 30 tablet 1   terbinafine  (LAMISIL ) 250 MG tablet  Take 1 tablet (250 mg total) by mouth daily. (Patient not taking: Reported on 02/13/2024) 30 tablet 0   tirzepatide  (ZEPBOUND ) 15 MG/0.5ML Pen Inject 15 mg into the skin once a week. (Patient not taking: Reported on 02/13/2024) 2 mL 2   Vitamin D , Ergocalciferol , (DRISDOL ) 1.25 MG (50000 UNIT) CAPS capsule Take 1 capsule (50,000 Units total) by mouth every 7 (seven) days. (Patient not taking: Reported on 02/13/2024) 12 capsule 3   No current facility-administered medications on file prior to visit.     The following portions of the patient's history were reviewed and updated as appropriate: allergies, current medications, past family history, past medical history, past social history, past surgical history and problem list.  ROS Otherwise as in subjective above  Objective: BP 124/80   Pulse 94   Temp 98.2 F (36.8 C)   Ht 5' 5 (1.651 m)   Wt 240 lb 9.6 oz (109.1 kg)   SpO2 98%   BMI 40.04 kg/m   Wt Readings from Last 3 Encounters:  02/13/24 240 lb 9.6 oz (109.1 kg)  12/24/23 251 lb (113.9 kg)  11/17/23 246 lb 6.4 oz (111.8 kg)    General appearance: alert, no distress, well developed, well nourished, congested and somewhat ill-appearing HEENT: normocephalic, sclerae anicteric, conjunctiva pink and moist,  TMs pearly, nares with turbinate edema and clear to mucoid discharge and erythema, pharynx normal Oral cavity: MMM, no lesions Neck: supple, no lymphadenopathy, no thyromegaly, no masses Heart: RRR, normal S1, S2, no murmurs Lungs: Relatively clear, no wheezes, rhonchi, or rales    Assessment: Encounter Diagnoses  Name Primary?   Acute cough Yes   Fever, unspecified fever cause    Respiratory tract infection    Moderate persistent asthma without complication      Plan: We discussed symptoms and concerns.  Advise she continue Coricidin, add nasal saline flush and good hydration.  Begin Tessalon  Perles for cough as needed for worse cough.  Continue albuterol  as needed.   Right now there is no sign she needs an antibiotic.  Discussed symptoms of the weekend that would prompt beginning Z-Pak antibiotic.  For right now symptoms suggest viral syndrome, viral respiratory illness.  I suspect symptoms will improve over the next 3 to 4 days.  Recheck and get reevaluated if worse or not improving.   Caci was seen today for other.  Diagnoses and all orders for this visit:  Acute cough  Fever, unspecified fever cause  Respiratory tract infection  Moderate persistent asthma without complication  Other orders -     albuterol  (VENTOLIN  HFA) 108 (90 Base) MCG/ACT inhaler; TAKE 2 PUFFS BY MOUTH EVERY 6 HOURS AS NEEDED FOR WHEEZE OR SHORTNESS OF BREATH -     fluticasone -salmeterol (WIXELA INHUB) 250-50 MCG/ACT AEPB; Inhale 1 puff into the lungs 2 (two) times daily. in the morning and at bedtime. -     albuterol  (PROVENTIL ) (2.5 MG/3ML) 0.083% nebulizer solution; Take 3 mLs (2.5 mg total) by nebulization every 6 (six) hours as needed for wheezing or shortness of breath. -     azithromycin  (ZITHROMAX ) 250 MG tablet; 2 tablets day 1, then 1 tablet days 2-5 -     benzonatate  (TESSALON ) 200 MG capsule; Take 1 capsule (200 mg total) by mouth 3 (three) times daily as needed for cough.    Follow up: prn

## 2024-02-16 ENCOUNTER — Ambulatory Visit: Payer: Self-pay

## 2024-02-16 NOTE — Telephone Encounter (Signed)
Sent mychart message about this

## 2024-02-16 NOTE — Telephone Encounter (Signed)
 FYI Only or Action Required?: Action required by provider: clinical question for provider.  Patient was last seen in primary care on 02/13/2024 by Bulah Alm RAMAN, PA-C.  Called Nurse Triage reporting Cough.  Symptoms began today.  Interventions attempted: Prescription medications:  SABRA  Symptoms are: unchanged.Pt. States I feel better but cough is still bothering me. Tessalon  Perles not helping cough. Asking for different medication.   Triage Disposition: See Physician Within 24 Hours  Patient/caregiver understands and will follow disposition?: No, wishes to speak with PCP   Copied from CRM #[id number]. Topic: Clinical - Red Word Triage >> Feb 16, 2024 12:50 PM Leonette SQUIBB wrote: Red Word that prompted transfer to Nurse Triage: in the office on Friday.  Viral infection.  Today coughing and having pain in her chest Reason for Disposition  [1] Continuous (nonstop) coughing interferes with work or school AND [2] no improvement using cough treatment per Care Advice  Answer Assessment - Initial Assessment Questions 1. ONSET: When did the cough begin?      Last week 2. SEVERITY: How bad is the cough today?      severe 3. SPUTUM: Describe the color of your sputum (e.g., none, dry cough; clear, white, yellow, green)     yellow 4. HEMOPTYSIS: Are you coughing up any blood? If Yes, ask: How much? (e.g., flecks, streaks, tablespoons, etc.)     no 5. DIFFICULTY BREATHING: Are you having difficulty breathing? If Yes, ask: How bad is it? (e.g., mild, moderate, severe)      yes 6. FEVER: Do you have a fever? If Yes, ask: What is your temperature, how was it measured, and when did it start?     yes 7. CARDIAC HISTORY: Do you have any history of heart disease? (e.g., heart attack, congestive heart failure)      no 8. LUNG HISTORY: Do you have any history of lung disease?  (e.g., pulmonary embolus, asthma, emphysema)     yes 9. PE RISK FACTORS: Do you have a history of blood  clots? (or: recent major surgery, recent prolonged travel, bedridden)     no 10. OTHER SYMPTOMS: Do you have any other symptoms? (e.g., runny nose, wheezing, chest pain)       Night sweats 11. PREGNANCY: Is there any chance you are pregnant? When was your last menstrual period?       no 12. TRAVEL: Have you traveled out of the country in the last month? (e.g., travel history, exposures)       no  Protocols used: Cough - Acute Productive-A-AH

## 2024-02-17 ENCOUNTER — Other Ambulatory Visit (HOSPITAL_COMMUNITY): Payer: Self-pay

## 2024-02-23 ENCOUNTER — Ambulatory Visit: Payer: Self-pay

## 2024-02-23 NOTE — Telephone Encounter (Signed)
 FYI Only or Action Required?: Action required by provider: update on patient condition.  Patient was last seen in primary care on 02/13/2024 by Bulah Alm RAMAN, PA-C.  Called Nurse Triage reporting Excessive Sweating.  Symptoms began 2 weeks ago.  Symptoms are: unchanged.  Triage Disposition: See PCP When Office is Open (Within 3 Days)  Patient/caregiver understands and will follow disposition?: No, wishes to speak with PCP      Copied from CRM #[id number]. Topic: Clinical - Medical Advice >> Feb 23, 2024  3:49 PM Myrick T wrote: Reason for CRM: patient called stated she is just sweating a lot with no fever and she is not sure why. Please f/u with patient        Reason for Disposition  [1] MODERATE sweating (e.g., interferes with normal activities like work or school) AND [2] cause unknown AND [3] new-onset in the last 4 weeks  Answer Assessment - Initial Assessment Questions Patient reports she is still experiencing excessive sweatiness which she discussed during her recent visit. Patient reports her other symptoms are all greatly improved and that her sweating has not worsened, but is still present. Patient wanted to make sure her PCP was aware and to see if they had any advice for her. Please contact the patient with a response to this request when able.       1. ONSET: When did the sweating start?      2 weeks 2. LOCATION: What part of your body has excessive sweating? (e.g., entire body; just face, underarms, palms, or soles of feet).      Upper body 3. SEVERITY: How bad is the sweating?    (Scale 1-10; or mild, moderate, severe)     Moderate 4. CAUSE: What do you think is causing the sweating?     Unsure  5. FEVER: Have you been having fevers? If Yes, ask: What is your temperature, how was it measured, and when did it start?     No, 97.4 F 6. OTHER SYMPTOMS: Do you have any other symptoms? (e.g., chest pain, difficulty breathing, lightheadedness,  weight loss)     Mild cough  Protocols used: Sweating-A-AH

## 2024-02-23 NOTE — Telephone Encounter (Signed)
 Answer Assessment - Initial Assessment Questions Patient reports she is still experiencing excessive sweatiness which she discussed during her recent visit. Patient reports her other symptoms are all greatly improved and that her sweating has not worsened, but is still present. Patient wanted to make sure her PCP was aware and to see if they had any advice for her. Please contact the patient with a response to this request when able.

## 2024-02-24 ENCOUNTER — Other Ambulatory Visit: Payer: Self-pay | Admitting: Medical

## 2024-02-24 MED ORDER — OXYBUTYNIN CHLORIDE 5 MG PO TABS: 5.0000 mg | ORAL_TABLET | Freq: Two times a day (BID) | ORAL | 0 refills | Status: DC

## 2024-02-24 NOTE — Telephone Encounter (Signed)
 Pt was notified.

## 2024-02-26 ENCOUNTER — Other Ambulatory Visit: Payer: Self-pay | Admitting: Medical

## 2024-03-14 ENCOUNTER — Other Ambulatory Visit: Payer: Self-pay | Admitting: Medical

## 2024-03-17 ENCOUNTER — Other Ambulatory Visit: Payer: Self-pay | Admitting: Medical

## 2024-03-18 ENCOUNTER — Encounter: Payer: Self-pay | Admitting: Family Medicine

## 2024-03-18 ENCOUNTER — Ambulatory Visit: Admitting: Family Medicine

## 2024-03-18 VITALS — BP 128/88 | HR 86 | Temp 96.7°F | Wt 248.6 lb

## 2024-03-18 DIAGNOSIS — N951 Menopausal and female climacteric states: Secondary | ICD-10-CM | POA: Diagnosis not present

## 2024-03-18 MED ORDER — VENLAFAXINE HCL ER 37.5 MG PO CP24: 75.0000 mg | ORAL_CAPSULE | Freq: Every day | ORAL | 1 refills | Status: DC

## 2024-03-18 NOTE — Progress Notes (Signed)
   Name: Rebecca Soto   Date of Visit: 03/18/24   Date of last visit with me: Visit date not found   CHIEF COMPLAINT:  Chief Complaint  Patient presents with   Acute Visit    Hot flashes, night sweats, difficulty sleeping, occasional headaches.        HPI:  Discussed the use of AI scribe software for clinical note transcription with the patient, who gave verbal consent to proceed.  History of Present Illness   Rebecca Soto is a 53 year old female who presents with hot flashes and sweating episodes.  She experiences hot flashes that occur unexpectedly, often while at her desk at work, and describes them as happening while doing 'absolutely nothing'. These episodes involve profuse sweating, with sweat 'coming down my body'. The hot flashes occur throughout the day and are followed by periods of feeling cold. At night, she experiences excessive sweating, describing it as 'somebody turned the faucet on'.  She underwent a hysterectomy in 2008 and has only recently started experiencing these symptoms. No anxiety during these episodes.  She also reports experiencing headaches, which she associates with the hot flashes. She is aware of the difference between these headaches and migraines, which she has experienced in the past.  She is currently taking a multivitamin and has no known allergies. She has a family history of breast cancer, with several aunts affected.         OBJECTIVE:       05/14/2023    1:52 PM  Depression screen PHQ 2/9  Decreased Interest 0  Down, Depressed, Hopeless 0  PHQ - 2 Score 0     BP Readings from Last 3 Encounters:  03/18/24 128/88  02/13/24 124/80  12/24/23 112/68    BP 128/88   Pulse 86   Temp (!) 96.7 F (35.9 C)   Wt 248 lb 9.6 oz (112.8 kg)   SpO2 99%   BMI 41.37 kg/m    Physical Exam          Physical Exam Constitutional:      Appearance: Normal appearance.  Neurological:     General: No focal deficit  present.     Mental Status: She is alert and oriented to person, place, and time. Mental status is at baseline.     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 She will evaluate her family hx and look into any possible contraindications after finding out cancer hx from family. - Initiated venlafaxine  37.5 mg daily for one week, then increase to 75 mg daily if tolerated. - Monitor for side effects: delayed orgasm, fatigue, dry mouth, decreased appetite, constipation. - Advise Metamucil for constipation if needed. - Schedule follow-up in 4-6 weeks to assess treatment efficacy and tolerability.         Jaziyah Gradel A. Vita MD [facility]

## 2024-03-18 NOTE — Patient Instructions (Signed)
 1 pill a day for 1 week, increase to 2 pills a day if no side effects.

## 2024-04-01 ENCOUNTER — Ambulatory Visit: Payer: Self-pay | Admitting: *Deleted

## 2024-04-01 NOTE — Telephone Encounter (Signed)
 Copied from CRM #[id number]. Topic: Clinical - Red Word Triage >> Apr 01, 2024  2:08 PM Carlatta H wrote: Red Word that prompted transfer to Nurse Triage: Patient has discolored mucus and congestion//Symptoms for about 3 days Reason for Disposition  [1] Continuous (nonstop) coughing interferes with work or school AND [2] no improvement using cough treatment per Care Advice  Answer Assessment - Initial Assessment Questions 1. ONSET: When did the cough begin?      I'm coughing.   I'm sick.   No fever.   I'm using Mucinex  and Robitussin and nothing is helping for the last couple of days.   Now I'm getting green mucus.  It started yellow.    2. SEVERITY: How bad is the cough today?     I'm getting green mucus now. 3. SPUTUM: Describe the color of your sputum (e.g., none, dry cough; clear, white, yellow, green)     Green mucus 4. HEMOPTYSIS: Are you coughing up any blood? If Yes, ask: How much? (e.g., flecks, streaks, tablespoons, etc.)     Not asked 5. DIFFICULTY BREATHING: Are you having difficulty breathing? If Yes, ask: How bad is it? (e.g., mild, moderate, severe)      I have asthma.  6. FEVER: Do you have a fever? If Yes, ask: What is your temperature, how was it measured, and when did it start?     No fever 7. CARDIAC HISTORY: Do you have any history of heart disease? (e.g., heart attack, congestive heart failure)      Not asked 8. LUNG HISTORY: Do you have any history of lung disease?  (e.g., pulmonary embolus, asthma, emphysema)     ASTHMA 9. PE RISK FACTORS: Do you have a history of blood clots? (or: recent major surgery, recent prolonged travel, bedridden)     AS 10. OTHER SYMPTOMS: Do you have any other symptoms? (e.g., runny nose, wheezing, chest pain)       Nose stuffy, sore throat 11. PREGNANCY: Is there any chance you are pregnant? When was your last menstrual period?       Not asked 12. TRAVEL: Have you traveled out of the country in the last  month? (e.g., travel history, exposures)       N/A  Protocols used: Cough - Acute Productive-A-AH FYI Only or Action Required?: FYI only for provider.  Patient was last seen in primary care on 03/18/2024 by Vita Morrow, MD.  Called Nurse Triage reporting Cough.Coughing up green mucus  Symptoms began several days ago.  Interventions attempted: OTC medications: Robitussin, Mucinex .  Symptoms are: gradually worsening.  Triage Disposition: See Physician Within 24 Hours  Patient/caregiver understands and will follow disposition?: Yes

## 2024-04-02 ENCOUNTER — Encounter: Payer: Self-pay | Admitting: Family Medicine

## 2024-04-02 ENCOUNTER — Ambulatory Visit: Admitting: Family Medicine

## 2024-04-02 VITALS — BP 124/82 | HR 83 | Wt 245.2 lb

## 2024-04-02 DIAGNOSIS — J069 Acute upper respiratory infection, unspecified: Secondary | ICD-10-CM | POA: Diagnosis not present

## 2024-04-02 DIAGNOSIS — J029 Acute pharyngitis, unspecified: Secondary | ICD-10-CM

## 2024-04-02 DIAGNOSIS — R0982 Postnasal drip: Secondary | ICD-10-CM | POA: Diagnosis not present

## 2024-04-02 DIAGNOSIS — N951 Menopausal and female climacteric states: Secondary | ICD-10-CM

## 2024-04-02 LAB — POCT RAPID STREP A (OFFICE): Rapid Strep A Screen: NEGATIVE

## 2024-04-02 MED ORDER — FLUTICASONE PROPIONATE 50 MCG/ACT NA SUSP
2.0000 | Freq: Every day | NASAL | 6 refills | Status: AC
Start: 2024-04-02 — End: ?

## 2024-04-02 NOTE — Patient Instructions (Signed)
 Fluticasone  Propionate  Humidifier   Mucinex  is fine

## 2024-04-02 NOTE — Progress Notes (Signed)
   Name: Rebecca Soto   Date of Visit: 04/02/24   Date of last visit with me: 03/18/2024   CHIEF COMPLAINT:  Chief Complaint  Patient presents with   Acute Visit    Coughing green mucus, stuffy nose, sore throat.        HPI:  Discussed the use of AI scribe software for clinical note transcription with the patient, who gave verbal consent to proceed.  History of Present Illness   Rebecca Soto is a 53 year old female who presents with a viral upper respiratory infection and follow-up for hot flashes.  She has been experiencing symptoms for the past four days, including green mucus production, sore throat, chest congestion, and a cough that worsens at night. She finds it difficult to expectorate the mucus and describes a sensation of something in her chest that she cannot clear. No fevers or chills are present. Her mother was sick first, followed by her, and then she became ill.  She is currently taking venlafaxine  75 mg once daily for hot flashes, which has significantly improved her symptoms. She reports only one mild hot flash in the past week and is satisfied with the current dosage. She initially started with a lower dose to assess tolerance before increasing to the current dose.  She mentions not using any GLP medications as they need to be renewed.         OBJECTIVE:       05/14/2023    1:52 PM  Depression screen PHQ 2/9  Decreased Interest 0  Down, Depressed, Hopeless 0  PHQ - 2 Score 0     BP Readings from Last 3 Encounters:  04/02/24 124/82  03/18/24 128/88  02/13/24 124/80    BP 124/82   Pulse 83   Wt 245 lb 3.2 oz (111.2 kg)   SpO2 98%   BMI 40.80 kg/m    Physical Exam          Physical Exam Constitutional:      Appearance: Normal appearance.  HENT:     Nose: Congestion and rhinorrhea present.     Mouth/Throat:     Mouth: Mucous membranes are moist.     Pharynx: No oropharyngeal exudate or posterior oropharyngeal erythema.   Neurological:     General: No focal deficit present.     Mental Status: She is alert and oriented to person, place, and time. Mental status is at baseline.     ASSESSMENT/PLAN:   Assessment & Plan Sore throat  Post-nasal drip  Hot flashes due to menopause  Viral URI with cough    Assessment and Plan    Acute viral upper respiratory infection with postnasal drip Likely viral etiology with postnasal drip causing throat irritation and cough. Conservative management recommended. - Prescribed fluticasone  propionate nasal spray for nasal congestion. - Advised use of a humidifier in the bedroom. - Encouraged consumption of spicy food to aid sinus drainage. - Recommended Mucinex  for symptomatic relief. - Consider prescribing a steroid if symptoms do not improve by Monday.  Menopausal hot flashes Significant improvement with venlafaxine  75 mg daily. Discussed future management options including hormone therapy if symptoms worsen. - Continue venlafaxine  75 mg once daily.         Shalisha Clausing A. Vita MD [facility]

## 2024-04-05 ENCOUNTER — Ambulatory Visit: Payer: Self-pay

## 2024-04-05 MED ORDER — METHYLPREDNISOLONE 4 MG PO TBPK: ORAL_TABLET | ORAL | 0 refills | Status: DC

## 2024-04-05 NOTE — Telephone Encounter (Signed)
 Still having Sx

## 2024-04-05 NOTE — Telephone Encounter (Signed)
 Spoke with patient, she would like medication.

## 2024-04-05 NOTE — Telephone Encounter (Signed)
 FYI Only or Action Required?: Action required by provider: update on patient condition.  Patient was last seen in primary care on 04/02/2024 by Vita Morrow, MD.  Called Nurse Triage reporting Fever and Cough.  Symptoms began several days ago.  Interventions attempted: OTC medications: nasal spray, mucinex , allegra.  Symptoms are: gradually worsening.  Triage Disposition: See Physician Within 24 Hours  Patient/caregiver understands and will follow disposition?: No, wishes to speak with PCP      Copied from CRM #[id number]. Topic: Clinical - Red Word Triage >> Apr 05, 2024 11:20 AM Shereese L wrote: [facility] that prompted transfer to Nurse Triage: fever with dark flem Reason for Disposition  Fever present > 3 days (72 hours)  Answer Assessment - Initial Assessment Questions 1. TEMPERATURE: What is the most recent temperature?  How was it measured?      Low  grade 2. ONSET: When did the fever start?      Several weeks 3. CHILLS: Do you have chills? If yes: How bad are they?  (e.g., none, mild, moderate, severe)     Endorses sweating 4. OTHER SYMPTOMS: Do you have any other symptoms besides the fever?  (e.g., abdomen pain, cough, diarrhea, earache, headache, sore throat, urination pain)     Productive dark green mucus, sore throat 5. CAUSE: If there are no symptoms, ask: What do you think is causing the fever?      unknown 6. CONTACTS: Does anyone else in the family have an infection?     Son has similar sx 7. TREATMENT: What have you done so far to treat this fever? (e.g., OTC fever medicines)     OTC with no relief - flonase  8. IMMUNOCOMPROMISE: Do you have any of the following: diabetes, HIV positive, splenectomy, cancer chemotherapy, chronic steroid treatment, transplant patient, etc.?     N/a 9. PREGNANCY: Is there any chance you are pregnant? When was your last menstrual period?     N/a 10. TRAVEL: Have you traveled out of the country in the last  month? (e.g., travel history, exposures)       N/a    Pt had AV last week for sx and reports that provider instructed pt to call back if sx did not improve with OTC. Pt reports sx have actually worsened. Offered to schedule another AV, but pt requesting message to be sent to provider.  Triager will forward encounter for Dr Vita 's office to review and advise. Patient verbalized understanding and is expecting call back from office for next steps. Triager also advised that if pt does not hear back from office, to follow disposition for further evaluation/treatment.  Protocols used: [facility]

## 2024-04-06 ENCOUNTER — Telehealth: Payer: Self-pay | Admitting: Internal Medicine

## 2024-04-06 MED ORDER — METHYLPREDNISOLONE 4 MG PO TBPK: ORAL_TABLET | ORAL | 0 refills | Status: DC

## 2024-04-06 NOTE — Telephone Encounter (Signed)
 Resent in yesterday rx as it was done as a phone in  Copied from CRM [id number]. Topic: General - Other >> Apr 05, 2024  5:39 PM Rebecca Soto wrote: Reason for CRM: Patient called in about methylPREDNISolone  (MEDROL  DOSEPAK) 4 MG TBPK tablet. It looks like it's been approved. Patient is going to check in with her pharmacy to see if they have received it yet

## 2024-04-12 ENCOUNTER — Other Ambulatory Visit: Payer: Self-pay | Admitting: Family Medicine

## 2024-04-12 DIAGNOSIS — N951 Menopausal and female climacteric states: Secondary | ICD-10-CM

## 2024-04-13 ENCOUNTER — Other Ambulatory Visit: Payer: Self-pay | Admitting: Medical

## 2024-04-13 DIAGNOSIS — Z1231 Encounter for screening mammogram for malignant neoplasm of breast: Secondary | ICD-10-CM

## 2024-04-19 ENCOUNTER — Ambulatory Visit: Admitting: Medical

## 2024-05-06 ENCOUNTER — Encounter (HOSPITAL_COMMUNITY): Payer: Self-pay

## 2024-05-06 ENCOUNTER — Ambulatory Visit (HOSPITAL_COMMUNITY): Admission: EM | Admit: 2024-05-06 | Discharge: 2024-05-06 | Disposition: A | Discharge status: Home / Self Care

## 2024-05-06 DIAGNOSIS — I10 Essential (primary) hypertension: Secondary | ICD-10-CM

## 2024-05-06 NOTE — ED Triage Notes (Signed)
 Patient presents with  elevated blood pressure reading at work today. Patient states her amlodipine  was discontinue x 2 weeks ago. She was started on metoprolol  x 2 days ago. Patient states she has bilateral heal pain.

## 2024-05-06 NOTE — Discharge Instructions (Addendum)
 Your blood pressure was elevated during today's visit, but you are not experiencing any symptoms at this time. It's very important to take your prescribed blood pressure medication exactly as directed. Continue taking metoprolol  once daily for one more day (a total of three days), then increase to twice daily.   Taking your medication consistently--along with healthy lifestyle habits--will help keep your blood pressure under control and reduce the risk of complications. This includes eating a low-sodium diet, maintaining a healthy weight, exercising regularly, limiting alcohol, and avoiding smoking.  Please check your blood pressure every day and write down your readings, ideally at the same time each day. Bring this log with you to your next appointment so your provider can review your progress.  If you experience dizziness, chest pain, shortness of breath, vision changes, or severe headaches, seek medical attention immediately. Follow up with your primary care provider as scheduled to continue monitoring your blood pressure and make any adjustments to your treatment plan as needed.

## 2024-05-06 NOTE — ED Provider Notes (Signed)
 [facility]    CSN: [id number] Arrival date & time: 05/06/24  1609      History   Chief Complaint Chief Complaint  Patient presents with   Hypertension    HPI Rebecca Soto is a 53 y.o. female.   Discussed the use of AI scribe software for clinical note transcription with the patient, who gave verbal consent to proceed.   The patient presents for evaluation of elevated blood pressure identified during a workplace biometric screening, where her blood pressure was measured at 168/102. She has a known history of hypertension, previously managed with amlodipine , which was discontinued due to lower extremity swelling. She was off antihypertensive therapy for approximately one week before starting metoprolol  50 mg daily two days ago.  Since starting the new medication, the patient reports feeling lightheaded, foggy, and occasionally having difficulty finding words. She also notes mild, intermittent headaches but denies any severe or sudden-onset headache. Last night, she experienced numbness and tingling around both sides of her lower face for about one hour while watching television, which resolved spontaneously when she laid back and relaxed. She denies any recurrence of these symptoms.  The patient also reports chronic pain in the back of both legs and feet that worsens when removing her shoes and improves when wearing them. She initially thought her lightheadedness and cognitive symptoms might be related to this discomfort.  She states that her blood pressure is usually well controlled and describes this recent elevation as unusual. The swelling in her legs from prior amlodipine  use has improved since discontinuation, though she still notices mild residual swelling. She denies chest pain, palpitations, shortness of breath, vision changes, nausea, vomiting, or any respiratory symptoms beyond her baseline asthma. The patient was referred from her workplace screening for  further evaluation due to the elevated blood pressure reading.  The following sections of the patient's history were reviewed and updated as appropriate: allergies, current medications, past family history, past medical history, past social history, past surgical history, and problem list.         Past Medical History:  Diagnosis Date   Allergy     Asthma    Blood transfusion without reported diagnosis    Goiter    Herpes labialis 08/22/2017   Hyperlipidemia    Hypertension 2019    Patient Active Problem List   Diagnosis Date Noted   Onychomycosis 11/17/2023   Asthma 08/08/2023   Degenerative joint disease of right hip 07/21/2023   Vitamin D  deficiency 05/14/2023   Thyroid  nodule 05/14/2023   Adenomatous polyp of colon 06/20/2021   Leg swelling 04/18/2021   BMI 40.0-44.9, adult (HCC) 04/18/2021   Screen for STD (sexually transmitted disease) 11/23/2020   Screen for colon cancer 11/23/2020   Encounter for screening mammogram for malignant neoplasm of breast 11/23/2020   Screening for lipid disorders 11/23/2020   Hair loss 11/23/2020   Daytime somnolence 10/29/2019   Fatigue 10/29/2019   Arthralgia of hand 10/29/2019   Elbow pain 10/29/2019   Ulnar nerve compression, 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   Acanthosis nigricans 05/31/2019   Blood in urine 05/31/2019   Candidal vulvovaginitis 05/31/2019   Constipation 05/31/2019   Dysuria 03/19/2019   Hx of seasonal allergies 10/15/2018   Nocturia 10/15/2018   Gastroesophageal reflux disease 10/15/2018   Moderate persistent asthma without complication 08/14/2018   Routine general medical examination at a health care facility 08/14/2018   Nonintractable headache 08/14/2018   Abnormal mammogram 08/14/2018   Goiter 08/14/2018   Renal cyst 08/14/2018  Essential hypertension, benign 07/23/2018   Family history of cancer 04/17/2018   Herpes labialis 08/22/2017   Low back pain 06/07/2016   Frequent urination 06/07/2016   History of renal  stone 06/07/2016   Hypothyroidism 06/07/2016    Past Surgical History:  Procedure Laterality Date   ABDOMINAL HYSTERECTOMY  2008   still has ovaries, fibroids   COLONOSCOPY     TOTAL ABDOMINAL HYSTERECTOMY  2008    OB History   No obstetric history on file.      Home Medications    Prior to Admission medications   Medication Sig Start Date End Date Taking? Authorizing Provider  albuterol  (PROVENTIL ) (2.5 MG/3ML) 0.083% nebulizer solution Take 3 mLs (2.5 mg total) by nebulization every 6 (six) hours as needed for wheezing or shortness of breath. 02/13/24  Yes Tysinger, Alm RAMAN, PA-C  albuterol  (VENTOLIN  HFA) 108 (90 Base) MCG/ACT inhaler TAKE 2 PUFFS BY MOUTH EVERY 6 HOURS AS NEEDED FOR WHEEZE OR SHORTNESS OF BREATH 02/13/24  Yes Tysinger, Alm RAMAN, PA-C  amLODipine  (NORVASC ) 5 MG tablet TAKE 1 TABLET (5 MG TOTAL) BY MOUTH DAILY. 02/26/24  Yes Tysinger, Alm RAMAN, PA-C  Cholecalciferol (VITAMIN D ) 50 MCG (2000 UT) CAPS Take 1 capsule (2,000 Units total) by mouth daily. 05/15/23  Yes Tysinger, Alm RAMAN, PA-C  fluticasone  (FLONASE ) 50 MCG/ACT nasal spray Place 2 sprays into both nostrils daily. 04/02/24  Yes Jha, Panav, MD  fluticasone -salmeterol (WIXELA INHUB) 250-50 MCG/ACT AEPB Inhale 1 puff into the lungs 2 (two) times daily. in the morning and at bedtime. 02/13/24  Yes Tysinger, Alm RAMAN, PA-C  ibuprofen (ADVIL) 600 MG tablet Take 1 tablet by mouth every 8 (eight) hours as needed.   Yes [provider]  levothyroxine  (SYNTHROID ) 50 MCG tablet Take 1 tablet (50 mcg total) by mouth daily. 05/15/23  Yes Tysinger, Alm RAMAN, PA-C  montelukast  (SINGULAIR ) 10 MG tablet TAKE 1 TABLET BY MOUTH EVERYDAY AT BEDTIME 05/15/23  Yes Tysinger, Alm RAMAN, PA-C  rosuvastatin  (CRESTOR ) 10 MG tablet TAKE 1 TABLET BY MOUTH EVERY DAY 02/26/24  Yes Tysinger, Alm RAMAN, PA-C  valACYclovir  (VALTREX ) 1000 MG tablet TAKE 2 TABLETS BY MOUTH EVERY 12HOURS X 1 DAY. START ASAP AT ONSET OF SYMPTOMS. 05/15/23  Yes Tysinger,  Alm RAMAN, PA-C  venlafaxine  XR (EFFEXOR -XR) 37.5 MG 24 hr capsule TAKE 2 CAPSULES (75 MG) BY MOUTH DAILY WITH BREAKFAST. 04/12/24  Yes Jha, Panav, MD  Vitamin D , Ergocalciferol , (DRISDOL ) 1.25 MG (50000 UNIT) CAPS capsule TAKE 1 CAPSULE (50,000 UNITS TOTAL) BY MOUTH EVERY 7 (SEVEN) DAYS 03/15/24  Yes Tysinger, Alm RAMAN, PA-C  oxybutynin  (DITROPAN ) 5 MG tablet TAKE 1 TABLET BY MOUTH TWICE A DAY 03/17/24   Tysinger, Alm RAMAN, PA-C  tirzepatide  (ZEPBOUND ) 12.5 MG/0.5ML Pen Inject 12.5 mg into the skin once a week. Patient not taking: No sig reported 11/17/23   Tysinger, Alm RAMAN, PA-C  tirzepatide  (ZEPBOUND ) 15 MG/0.5ML Pen Inject 15 mg into the skin once a week. Patient not taking: No sig reported 12/24/23   Tysinger, Alm RAMAN, PA-C    Family History Family History  Problem Relation Age of Onset   Hypertension Mother    Heart disease Mother        cardiomegaly   Other Mother        brain tumor   Aneurysm Mother        brain   Esophageal cancer Father    Hypertension Father    Cancer Father  throat   Hypertension Sister    Hypertension Sister    Hypertension Brother    Diabetes Brother    Diabetes Brother    Hypertension Brother    Diabetes Brother    Hypertension Brother    Diabetes Brother    Hypertension Brother    Diabetes Brother    Hypertension Brother    Diabetes Brother    Hypertension Brother    Hypertension Brother    Diabetes Maternal Grandmother    Hypertension Maternal Grandmother    Hypertension Maternal Grandfather    Hypertension Paternal Grandmother    Hypertension Paternal Grandfather    Cancer Other    Hypertension Other    Hyperlipidemia Other    Colon cancer Neg Hx    Colon polyps Neg Hx    Rectal cancer Neg Hx    Stomach cancer Neg Hx     Social History Social History   Tobacco Use   Smoking status: Never   Smokeless tobacco: Never  Vaping Use   Vaping status: Never Used  Substance Use Topics   Alcohol use: Not Currently    Comment: minimal    Drug use: No     Allergies   Chlorpheniramine , Diphenhydramine hcl, Iodinated contrast media, Other, Aspirin, Diphenhydramine, Iohexol , and Latex   Review of Systems Review of Systems  Eyes:  Negative for photophobia and visual disturbance.  Respiratory:  Negative for shortness of breath.   Cardiovascular:  Positive for leg swelling. Negative for chest pain and palpitations.  Gastrointestinal:  Negative for nausea and vomiting.  Musculoskeletal:  Positive for arthralgias (BILATERAL HEEL PAIN FOR A WHILE).  Neurological:  Positive for light-headedness, numbness (mild to both sides of lower face last night, lasted about an hour then resolved) and headaches (mild).  All other systems reviewed and are negative.    Physical Exam Triage Vital Signs ED Triage Vitals  Encounter Vitals Group     BP 05/06/24 1746 (!) 156/103     Girls Systolic BP Percentile --      Girls Diastolic BP Percentile --      Boys Systolic BP Percentile --      Boys Diastolic BP Percentile --      Pulse Rate 05/06/24 1746 63     Resp 05/06/24 1746 20     Temp 05/06/24 1746 97.9 F (36.6 C)     Temp Source 05/06/24 1746 Oral     SpO2 05/06/24 1746 96 %     Weight --      Height --      Head Circumference --      Peak Flow --      Pain Score 05/06/24 1747 0     Pain Loc --      Pain Education --      Exclude from Growth Chart --    No data found.  Updated Vital Signs BP (!) 155/95   Pulse 63   Temp 97.9 F (36.6 C) (Oral)   Resp 20   SpO2 96%   Visual Acuity Right Eye Distance:   Left Eye Distance:   Bilateral Distance:    Right Eye Near:   Left Eye Near:    Bilateral Near:     Physical Exam Vitals reviewed.  Constitutional:      General: She is awake. She is not in acute distress.    Appearance: Normal appearance. She is well-developed. She is not ill-appearing, toxic-appearing or diaphoretic.  HENT:     Head: Normocephalic.  Right Ear: Hearing normal.     Left Ear: Hearing  normal.     Nose: Nose normal.     Mouth/Throat:     Mouth: Mucous membranes are moist.  Eyes:     General: Vision grossly intact. No visual field deficit.    Conjunctiva/sclera: Conjunctivae normal.  Cardiovascular:     Rate and Rhythm: Normal rate and regular rhythm.     Heart sounds: Normal heart sounds.     Comments: Mild bilateral lower extremity nonpitting edema  Pulmonary:     Effort: Pulmonary effort is normal.     Breath sounds: Normal breath sounds and air entry.  Musculoskeletal:        General: Normal range of motion.     Cervical back: Normal range of motion and neck supple.     Right lower leg: Edema present.     Left lower leg: Edema present.  Skin:    General: Skin is warm and dry.  Neurological:     General: No focal deficit present.     Mental Status: She is alert and oriented to person, place, and time.     Cranial Nerves: No cranial nerve deficit, dysarthria or facial asymmetry.     Sensory: Sensation is intact.     Motor: Motor function is intact.     Coordination: Coordination is intact.     Gait: Gait is intact.  Psychiatric:        Attention and Perception: Attention and perception normal.        Mood and Affect: Mood and affect normal.        Speech: Speech normal.        Behavior: Behavior normal. Behavior is cooperative.        Thought Content: Thought content normal.        Cognition and Memory: Cognition and memory normal.        Judgment: Judgment normal.      UC Treatments / Results  Labs (all labs ordered are listed, but only abnormal results are displayed) Labs Reviewed - No data to display  EKG   Radiology No results found.  Procedures Procedures (including critical care time)  Medications Ordered in UC Medications - No data to display  Initial Impression / Assessment and Plan / UC Course  I have reviewed the triage vital signs and the nursing notes.  Pertinent labs & imaging results that were available during my care of  the patient were reviewed by me and considered in my medical decision making (see chart for details).     The patient presents with elevated blood pressure readings, measuring 168/102 at her workplace screening and 156/103 on arrival to clinic, improving to 155/95 during the visit. She has a known history of hypertension previously managed with amlodipine , which was discontinued due to swelling. After a one-week lapse in therapy, she was started on metoprolol  50 mg daily two days ago. She reports mild lightheadedness, foggy thinking, occasional word-finding difficulty, a mild headache, and one brief episode of facial numbness that resolved spontaneously. She denies chest pain, shortness of breath, palpitations, or vision changes. The current elevated readings are most likely due to the recent gap in medication and the physiologic adjustment to metoprolol . Her symptoms are consistent with mild hypertensive effects but do not indicate a hypertensive emergency.  The plan is to continue metoprolol  50 mg once daily, increasing to twice daily starting Saturday as prescribed by her primary care provider. The patient was counseled on the  importance of consistent medication adherence, home blood pressure monitoring at the same time each day, and logging results for follow-up review. Lifestyle modifications, including reducing sodium intake, maintaining hydration, and limiting stress, were discussed. She was advised to follow up with her primary care provider as scheduled for ongoing management. Emergency evaluation was advised if she develops severe headache, chest pain, vision changes, neurological symptoms, or if systolic blood pressure reaches or exceeds 185-190 mmHg.  Today's evaluation has revealed no signs of a dangerous process. Discussed diagnosis with patient and/or guardian. Patient and/or guardian aware of their diagnosis, possible red flag symptoms to watch out for and need for close follow up. Patient  and/or guardian understands verbal and written discharge instructions. Patient and/or guardian comfortable with plan and disposition.  Patient and/or guardian has a clear mental status at this time, good insight into illness (after discussion and teaching) and has clear judgment to make decisions regarding their care  Documentation was completed with the aid of voice recognition software. Transcription may contain typographical errors.  Final Clinical Impressions(s) / UC Diagnoses   Final diagnoses:  Essential hypertension     Discharge Instructions      Your blood pressure was elevated during today's visit, but you are not experiencing any symptoms at this time. It's very important to take your prescribed blood pressure medication exactly as directed. Continue taking metoprolol  once daily for one more day (a total of three days), then increase to twice daily.   Taking your medication consistently--along with healthy lifestyle habits--will help keep your blood pressure under control and reduce the risk of complications. This includes eating a low-sodium diet, maintaining a healthy weight, exercising regularly, limiting alcohol, and avoiding smoking.  Please check your blood pressure every day and write down your readings, ideally at the same time each day. Bring this log with you to your next appointment so your provider can review your progress.  If you experience dizziness, chest pain, shortness of breath, vision changes, or severe headaches, seek medical attention immediately. Follow up with your primary care provider as scheduled to continue monitoring your blood pressure and make any adjustments to your treatment plan as needed.      ED Prescriptions   None    PDMP not reviewed this encounter.   Iola Lukes, [place] 05/06/24 [phone]

## 2024-05-10 ENCOUNTER — Ambulatory Visit: Admitting: Podiatry

## 2024-05-13 ENCOUNTER — Ambulatory Visit
Admission: RE | Admit: 2024-05-13 | Discharge: 2024-05-13 | Disposition: A | Source: Ambulatory Visit | Attending: Medical | Admitting: Medical

## 2024-05-13 DIAGNOSIS — Z1231 Encounter for screening mammogram for malignant neoplasm of breast: Secondary | ICD-10-CM

## 2024-05-18 ENCOUNTER — Ambulatory Visit: Payer: Self-pay | Admitting: Medical

## 2024-05-19 ENCOUNTER — Encounter: Payer: Commercial Managed Care - PPO | Admitting: Medical

## 2024-05-19 NOTE — Telephone Encounter (Signed)
 See message from yesterday.  I would like for 1 of you to contact her.  She no-show to physical today.  I noticed her PCP is changed to someone else.  Her son recently switched to Dr. Vita.  So I was not sure if she was planning on coming back to see me or not  She obviously needs to be reminded about the no-show policy  I was curious if she was switching PCPs what the issue may be.  I like to know if there is an issue with my care or if there is an issue  Shane

## 2024-05-20 ENCOUNTER — Encounter: Payer: Self-pay | Admitting: Internal Medicine

## 2024-06-21 ENCOUNTER — Ambulatory Visit: Admitting: Podiatry

## 2024-06-21 ENCOUNTER — Encounter: Payer: Self-pay | Admitting: Podiatry

## 2024-06-21 ENCOUNTER — Ambulatory Visit (INDEPENDENT_AMBULATORY_CARE_PROVIDER_SITE_OTHER)

## 2024-06-21 DIAGNOSIS — N760 Acute vaginitis: Secondary | ICD-10-CM | POA: Insufficient documentation

## 2024-06-21 DIAGNOSIS — M7661 Achilles tendinitis, right leg: Secondary | ICD-10-CM

## 2024-06-21 DIAGNOSIS — M7662 Achilles tendinitis, left leg: Secondary | ICD-10-CM

## 2024-06-21 MED ORDER — METHYLPREDNISOLONE 4 MG PO TBPK: ORAL_TABLET | ORAL | 0 refills | Status: AC

## 2024-06-21 MED ORDER — BETAMETHASONE SOD PHOS & ACET 6 (3-3) MG/ML IJ SUSP
3.0000 mg | Freq: Once | INTRAMUSCULAR | Status: AC
  Administered 2024-06-21: 3 mg via INTRA_ARTICULAR

## 2024-06-21 MED ORDER — MELOXICAM 15 MG PO TABS: 15.0000 mg | ORAL_TABLET | Freq: Every day | ORAL | 1 refills | Status: AC

## 2024-06-21 NOTE — Progress Notes (Signed)
   Chief Complaint  Patient presents with   Foot Pain    Posterior heel bilateral (R>L) - aching x 5 months, walking steps aggravates, tried new shoes-no help   New Patient (Initial Visit)    Est pt 2020    HPI: 53 y.o. female presenting today for evaluation of posterior ankle pain ongoing since July 2025.   Brief history: Onset ~ July 2025.  No history of injury.  At this time she began walking for exercise including uphill.  She also initially admitted to walking around the house the majority of the time barefoot.  She had not done anything for treatment  Past Medical History:  Diagnosis Date   Allergy     Asthma    Blood transfusion without reported diagnosis    Goiter    Herpes labialis 08/22/2017   Hyperlipidemia    Hypertension 2019    Past Surgical History:  Procedure Laterality Date   ABDOMINAL HYSTERECTOMY  2008   still has ovaries, fibroids   COLONOSCOPY     TOTAL ABDOMINAL HYSTERECTOMY  2008    Allergies  Allergen Reactions   Chlorpheniramine  Itching   Diphenhydramine Hcl Other (See Comments)    Unknown   Iodinated Contrast Media Hives    Other reaction(s): Unknown   Other Anaphylaxis and Hives    Artichokes IV contrast/dye   Aspirin Other (See Comments)    Dry throat and coughing  Other reaction(s): Unknown   Diphenhydramine     Other reaction(s): Unknown   Iohexol  Hives   Latex     Other reaction(s): Unknown     Physical Exam: General: The patient is alert and oriented x3 in no acute distress.  Dermatology: Skin is warm, dry and supple bilateral lower extremities.   Vascular: Palpable pedal pulses bilaterally. Capillary refill within normal limits.  No appreciable edema.  No erythema.  Neurological: Grossly intact via light touch  Musculoskeletal Exam: Tenderness to palpation around the Achilles tendon bilateral.  Muscle strength 5/5 all compartments  Radiographic Exam B/L feet 06/21/2024:  Normal osseous mineralization. Joint spaces  preserved.  No fractures or osseous irregularities noted.  Impression: Negative  Assessment/Plan of Care: 1.  Achilles tendinitis bilateral  -Patient evaluated.  X-rays reviewed -Injection of 0.5 cc Celestone  Soluspan injected around the posterior aspect of the ankle bilateral -Medrol  Dosepak -Meloxicam  15 mg daily after completion of Dosepak -Avoid uphill walking that is strenuous to the Achilles -Avoid barefoot even around the home.  Recommend OOFOS recovery slides (she purchased them on [employer] during the visit) -Recommend daily stretching exercises which were demonstrated today -Return to clinic 4 weeks  *has a standing work station       Thresa EMERSON Sar, DPM [facility]  Dr. Thresa EMERSON Sar, DPM    [address] [place], [place] [address]                Office [phone]  Fax [phone]

## 2024-07-26 ENCOUNTER — Emergency Department (HOSPITAL_COMMUNITY)
Admission: EM | Admit: 2024-07-26 | Discharge: 2024-07-26 | Disposition: A | Discharge status: Home / Self Care | Attending: Emergency Medicine | Admitting: Emergency Medicine

## 2024-07-26 ENCOUNTER — Other Ambulatory Visit: Payer: Self-pay

## 2024-07-26 ENCOUNTER — Ambulatory Visit: Admitting: Podiatry

## 2024-07-26 ENCOUNTER — Emergency Department (HOSPITAL_COMMUNITY)

## 2024-07-26 DIAGNOSIS — J45909 Unspecified asthma, uncomplicated: Secondary | ICD-10-CM | POA: Diagnosis not present

## 2024-07-26 DIAGNOSIS — S39012A Strain of muscle, fascia and tendon of lower back, initial encounter: Secondary | ICD-10-CM | POA: Insufficient documentation

## 2024-07-26 DIAGNOSIS — X58XXXA Exposure to other specified factors, initial encounter: Secondary | ICD-10-CM | POA: Diagnosis not present

## 2024-07-26 DIAGNOSIS — Z79899 Other long term (current) drug therapy: Secondary | ICD-10-CM | POA: Insufficient documentation

## 2024-07-26 DIAGNOSIS — Z7951 Long term (current) use of inhaled steroids: Secondary | ICD-10-CM | POA: Diagnosis not present

## 2024-07-26 DIAGNOSIS — Z91040 Latex allergy status: Secondary | ICD-10-CM | POA: Insufficient documentation

## 2024-07-26 DIAGNOSIS — I10 Essential (primary) hypertension: Secondary | ICD-10-CM | POA: Insufficient documentation

## 2024-07-26 DIAGNOSIS — M545 Low back pain, unspecified: Secondary | ICD-10-CM | POA: Diagnosis present

## 2024-07-26 LAB — URINALYSIS, ROUTINE W REFLEX MICROSCOPIC
Bilirubin Urine: NEGATIVE
Glucose, UA: NEGATIVE mg/dL
Ketones, ur: NEGATIVE mg/dL
Leukocytes,Ua: NEGATIVE
Nitrite: NEGATIVE
Protein, ur: NEGATIVE mg/dL
Specific Gravity, Urine: 1.02 (ref 1.005–1.030)
pH: 6 (ref 5.0–8.0)

## 2024-07-26 MED ORDER — LIDOCAINE 5 % EX PTCH: 1.0000 | MEDICATED_PATCH | CUTANEOUS | 0 refills | Status: AC

## 2024-07-26 MED ORDER — DEXAMETHASONE SOD PHOSPHATE PF 10 MG/ML IJ SOLN
10.0000 mg | Freq: Once | INTRAMUSCULAR | Status: AC
  Administered 2024-07-26: 10 mg via INTRAMUSCULAR

## 2024-07-26 MED ORDER — LIDOCAINE 5 % EX PTCH
1.0000 | MEDICATED_PATCH | CUTANEOUS | Status: DC
  Administered 2024-07-26: 1 via TRANSDERMAL
  Filled 2024-07-26: qty 1

## 2024-07-26 MED ORDER — LIDOCAINE 5 % EX PTCH: 1.0000 | MEDICATED_PATCH | CUTANEOUS | Status: DC

## 2024-07-26 MED ORDER — KETOROLAC TROMETHAMINE 15 MG/ML IJ SOLN
15.0000 mg | Freq: Once | INTRAMUSCULAR | Status: AC
  Administered 2024-07-26: 15 mg via INTRAMUSCULAR
  Filled 2024-07-26: qty 1

## 2024-07-26 NOTE — Discharge Instructions (Signed)
 It was a pleasure taking care of you today. You were seen in the Emergency Department for evaluation of back pain. Your work-up was reassuring. Your Xray/urinalysis showed no acute abnormality or evidence of infection.  I do suspect your symptoms are likely secondary to musculoskeletal pain.  As discussed, I recommend you follow-up with your PCP for possible physical therapy referral if your symptoms persist. Refer to the attached documentation for further management of your symptoms.   Please return to the ER if you experience chest pain, trouble breathing, intractable nausea/vomiting or any other life threatening illnesses.

## 2024-07-26 NOTE — ED Provider Notes (Signed)
 " [facility] Provider Note   CSN: [id number] Arrival date & time: 07/26/24  9244     Patient presents with: Back Pain   Rebecca Soto is a 54 y.o. female with past medical history of hypertension, asthma, GERD, hyperlipidemia, who presents emergency department for evaluation of back pain.  Patient reports that she began experiencing left lower sided back pain yesterday.  She denies any trauma or falls.  She does report taking ibuprofen with minimal relief.  She denies any dysuria or hematuria, but does endorse a strange smell to her urine.  She denies any nausea or vomiting.  Patient describes the pain as a sharp and stabbing pain in her lower back.  No fever.  No additional complaints.   Back Pain      Prior to Admission medications  Medication Sig Start Date End Date Taking? Authorizing Provider  lidocaine  (LIDODERM ) 5 % Place 1 patch onto the skin daily. Remove & Discard patch within 12 hours or as directed by MD 07/26/24  Yes Kellene Mccleary, Marry RAMAN, PA-C  albuterol  (PROVENTIL ) (2.5 MG/3ML) 0.083% nebulizer solution Take 3 mLs (2.5 mg total) by nebulization every 6 (six) hours as needed for wheezing or shortness of breath. 02/13/24   Tysinger, Alm RAMAN, PA-C  albuterol  (VENTOLIN  HFA) 108 (90 Base) MCG/ACT inhaler TAKE 2 PUFFS BY MOUTH EVERY 6 HOURS AS NEEDED FOR WHEEZE OR SHORTNESS OF BREATH 02/13/24   Tysinger, Alm RAMAN, PA-C  amLODipine  (NORVASC ) 5 MG tablet TAKE 1 TABLET (5 MG TOTAL) BY MOUTH DAILY. 02/26/24   Tysinger, Alm RAMAN, PA-C  Cholecalciferol (VITAMIN D ) 50 MCG (2000 UT) CAPS Take 1 capsule (2,000 Units total) by mouth daily. 05/15/23   Tysinger, Alm RAMAN, PA-C  cloNIDine (CATAPRES) 0.2 MG tablet Take 0.2 mg by mouth 2 (two) times daily. 05/26/24   [provider]  fluticasone  (FLONASE ) 50 MCG/ACT nasal spray Place 2 sprays into both nostrils daily. 04/02/24   Jha, Panav, MD  fluticasone -salmeterol (WIXELA INHUB) 250-50  MCG/ACT AEPB Inhale 1 puff into the lungs 2 (two) times daily. in the morning and at bedtime. 02/13/24   Tysinger, Alm RAMAN, PA-C  ibuprofen (ADVIL) 600 MG tablet Take 1 tablet by mouth every 8 (eight) hours as needed.    [provider]  levothyroxine  (SYNTHROID ) 50 MCG tablet Take 1 tablet (50 mcg total) by mouth daily. 05/15/23   Tysinger, Alm RAMAN, PA-C  meloxicam  (MOBIC ) 15 MG tablet Take 1 tablet (15 mg total) by mouth daily. 06/21/24 10/19/24  Janit Thresa HERO, DPM  methylPREDNISolone  (MEDROL  DOSEPAK) 4 MG TBPK tablet 6 day dose pack - take as directed 06/21/24   Evans, Brent M, DPM  metoprolol  tartrate (LOPRESSOR ) 50 MG tablet Take 50 mg by mouth 2 (two) times daily. 05/26/24   [provider]  montelukast  (SINGULAIR ) 10 MG tablet TAKE 1 TABLET BY MOUTH EVERYDAY AT BEDTIME 05/15/23   Tysinger, Alm RAMAN, PA-C  oxybutynin  (DITROPAN ) 5 MG tablet TAKE 1 TABLET BY MOUTH TWICE A DAY 03/17/24   Tysinger, Alm RAMAN, PA-C  rosuvastatin  (CRESTOR ) 10 MG tablet TAKE 1 TABLET BY MOUTH EVERY DAY 02/26/24   Tysinger, Alm RAMAN, PA-C  valACYclovir  (VALTREX ) 1000 MG tablet TAKE 2 TABLETS BY MOUTH EVERY 12HOURS X 1 DAY. START ASAP AT ONSET OF SYMPTOMS. 05/15/23   Tysinger, Alm RAMAN, PA-C  venlafaxine  XR (EFFEXOR -XR) 37.5 MG 24 hr capsule TAKE 2 CAPSULES (75 MG) BY MOUTH DAILY WITH BREAKFAST. 04/12/24   Jha, Panav,  MD  Vitamin D , Ergocalciferol , (DRISDOL ) 1.25 MG (50000 UNIT) CAPS capsule TAKE 1 CAPSULE (50,000 UNITS TOTAL) BY MOUTH EVERY 7 (SEVEN) DAYS 03/15/24   Tysinger, Alm RAMAN, PA-C  WEGOVY  0.25 MG/0.5ML SOAJ SQ injection SMARTSIG:0.5 Milliliter(s) SUB-Q Once a Week 06/01/24   [provider]    Allergies: Chlorpheniramine , Diphenhydramine hcl, Iodinated contrast media, Other, Aspirin, Diphenhydramine, Iohexol , and Latex    Review of Systems  Musculoskeletal:  Positive for back pain.    Updated Vital Signs BP (!) 146/86 (BP Location: Left Arm)   Pulse 85   Temp 98.4 F (36.9 C)   Resp 20    Ht 5' 5 (1.651 m)   Wt 115.2 kg   SpO2 96%   BMI 42.27 kg/m   Physical Exam Vitals and nursing note reviewed.  Constitutional:      Appearance: Normal appearance. She is not ill-appearing.  Eyes:     General: No scleral icterus. Pulmonary:     Effort: Pulmonary effort is normal. No respiratory distress.  Musculoskeletal:        General: Tenderness present. No deformity.     Comments: Left lower back tenderness to palpation.  Patient did have a positive straight leg raise test.  Skin:    Coloration: Skin is not jaundiced.  Neurological:     General: No focal deficit present.     Mental Status: She is alert.  Psychiatric:        Mood and Affect: Mood normal.     (all labs ordered are listed, but only abnormal results are displayed) Labs Reviewed  URINALYSIS, ROUTINE W REFLEX MICROSCOPIC - Abnormal; Notable for the following components:      Result Value   APPearance HAZY (*)    Hgb urine dipstick SMALL (*)    Bacteria, UA RARE (*)    All other components within normal limits    EKG: None  Radiology: DG Lumbar Spine Complete Result Date: 07/26/2024 CLINICAL DATA:  Left low back pain. EXAM: LUMBAR SPINE - COMPLETE 4+ VIEW COMPARISON:  None Available. FINDINGS: There is no evidence of lumbar spine fracture. Alignment is normal. Intervertebral disc spaces are maintained. IMPRESSION: Negative. Electronically Signed   By: Camellia Candle M.D.   On: 07/26/2024 08:48    Procedures   Medications Ordered in the ED  ketorolac  (TORADOL ) 15 MG/ML injection 15 mg (has no administration in time range)  dexamethasone  (DECADRON ) injection 10 mg (has no administration in time range)  lidocaine  (LIDODERM ) 5 % 1 patch (has no administration in time range)                                Medical Decision Making Amount and/or Complexity of Data Reviewed Labs: ordered.   This patient presents to the ED for concern of back pain, this involves an extensive number of treatment options,  and is a complaint that carries with it a high risk of complications and morbidity.  Differential diagnosis includes: Fracture, UTI, pyelonephritis, cauda equina, spinal stenosis, sciatica, musculoskeletal spasm  Co morbidities:  hypertension, asthma, GERD, hyperlipidemia Lab Tests:  Not indicated  Imaging Studies:  I ordered imaging studies including lumbar x-ray I independently visualized and interpreted imaging which showed no acute abnormality or fracture I agree with the radiologist interpretation  Cardiac Monitoring/ECG:  The patient was maintained on a cardiac monitor.  I personally viewed and interpreted the cardiac monitored which showed an underlying rhythm of: Sinus rhythm  Medicines ordered and prescription drug management:  I ordered medication including  Medications  ketorolac  (TORADOL ) 15 MG/ML injection 15 mg (has no administration in time range)  dexamethasone  (DECADRON ) injection 10 mg (has no administration in time range)  lidocaine  (LIDODERM ) 5 % 1 patch (has no administration in time range)   for pain management Reevaluation of the patient after these medicines showed that the patient improved I have reviewed the patients home medicines and have made adjustments as needed  Test Considered:   none  Critical Interventions:   none  Consultations Obtained: None  Problem List / ED Course:     ICD-10-CM   1. Strain of lumbar region, initial encounter  S39.012A       MDM: 54 year old female who presents emergency department for evaluation of nontraumatic left-sided lower back pain.  Patient pain was worsened with elevation of her lower extremities, but it did not radiate down her leg.  She denied any urinary symptoms and her UA is without infection.  Her lumbar x-ray was negative for fracture.  I do suspect her symptoms are likely musculoskeletal in nature.  I did give her a dose of Decadron , Toradol  and a lidocaine  patch for pain management.  I have  also sent lidocaine  patches to patient's pharmacy.  I recommend patient follow-up with her PCP for further evaluation and possible referral to physical therapy if her symptoms persist.  Patient verbalized understanding to this.  Patient did request a work note for today.  She has been given this prior to discharge.  Patient's vital signs are stable.  Patient is appropriate for discharge at this time.   Dispostion:  After consideration of the diagnostic results and the patients response to treatment, I feel that the patient would benefit from supportive care.    Final diagnoses:  Strain of lumbar region, initial encounter    ED Discharge Orders          Ordered    lidocaine  (LIDODERM ) 5 %  Every 24 hours        07/26/24 0936               Torrence Marry RAMAN, PA-C 07/26/24 9062    Freddi Hamilton, MD 07/27/24 0740  "

## 2024-07-26 NOTE — ED Notes (Signed)
 Pt ambulated to bathroom

## 2024-07-26 NOTE — ED Triage Notes (Signed)
 Pain arrives for eval of L lower back pain since yesterday without injury. Took ibuprofen and BC power with some temporary relief. States urine has strange smell recently but denies other urinary symptoms.

## 2024-07-26 NOTE — ED Provider Triage Note (Signed)
 Emergency Medicine Provider Triage Evaluation Note  Rebecca Soto , a 54 y.o. female  was evaluated in triage.  Pt complains of left lower back pain, this started yesterday got worse overnight and into this morning.  It is much worse when she tries to move, moves her leg, there is no numbness pain or weakness going down the leg.  Pain is only in the left lower back, there is no fevers chills nausea or vomiting, she has felt some night sweats but attributes this to possibly being in menopause.  She has no dysuria or frequency but has noticed a malodorous urine which is not new.  She denies history of kidney stones denies history of back injury, no back surgery.  Tried Exer strength Tylenol  without relief.  Review of Systems  Positive: Back pain Negative: Fevers chills nausea vomiting  Physical Exam  BP (!) 146/86 (BP Location: Left Arm)   Pulse 85   Temp 98.4 F (36.9 C)   Resp 20   Ht 1.651 m (5' 5)   Wt 115.2 kg   SpO2 96%   BMI 42.27 kg/m  Gen:   Awake, no distress   Resp:  Normal effort  MSK:   Moves extremities without difficulty  Other:  Neurologic exam is unremarkable, gait is normal, able to move all 4 extremities, there is no reproducible tenderness over the back including the flank or the spine.  She does look uncomfortable trying to move around in the room  Medical Decision Making  Medically screening exam initiated at 8:15 AM.  Appropriate orders placed.  Orie Stella Sally was informed that the remainder of the evaluation will be completed by another provider, this initial triage assessment does not replace that evaluation, and the importance of remaining in the ED until their evaluation is complete.  Urinalysis, lumbar spine x-ray, definitely reproducible with her movements but not with palpation, suggest a musculoskeletal cause.   Cleotilde Rogue, MD 07/26/24 [phone]

## 2024-07-26 NOTE — ED Notes (Signed)
 Pt wheeled from ed . Pt ambulatory at time of discharge.

## 2024-07-27 ENCOUNTER — Ambulatory Visit: Payer: Self-pay

## 2024-07-27 NOTE — Telephone Encounter (Signed)
 FYI Only or Action Required?: FYI only for provider: appointment scheduled on 07/28/24.  Patient was last seen in primary care on 04/02/2024 by Vita Morrow, MD.  Called Nurse Triage reporting Back Pain.  Symptoms began several days ago.  Interventions attempted: OTC medications: Lidocaine  patches, Tylenol ..  Symptoms are: unchanged.  Triage Disposition: See PCP Within 2 Weeks  Patient/caregiver understands and will follow disposition?: Yes              Copied from CRM [id number]. Topic: Clinical - Red Word Triage >> Jul 27, 2024 10:23 AM Deaijah H wrote: Red Word that prompted transfer to Nurse Triage: Hospital follow up -  pain in lower left side of back, hard time moving around Reason for Disposition  Requesting regular office appointment  Answer Assessment - Initial Assessment Questions 1. REASON FOR CALL: What is the main reason for your call? or How can I best help you?       Patient calling to schedule hospital follow-up appointment as she was discharged from the hospital yesterday. 07/27/23 related to her back pain, and was advised to follow up with PCP. Appointment scheduled for 1/7.  Protocols used: Information Only Call - No Triage-A-AH

## 2024-07-28 ENCOUNTER — Ambulatory Visit: Payer: Self-pay

## 2024-07-28 ENCOUNTER — Inpatient Hospital Stay: Admitting: Family Medicine

## 2024-07-28 NOTE — Telephone Encounter (Signed)
 Patient notified of cancellation to scheduled hospital follow-up for 1/7. She agreed to reach out to the new PCP office at [facility]  [phone] [address] [place], [place] [address]    No further action needed.

## 2024-07-30 ENCOUNTER — Emergency Department (HOSPITAL_COMMUNITY)
Admission: EM | Admit: 2024-07-30 | Discharge: 2024-07-31 | Disposition: A | Attending: Emergency Medicine | Admitting: Emergency Medicine

## 2024-07-30 ENCOUNTER — Other Ambulatory Visit: Payer: Self-pay

## 2024-07-30 DIAGNOSIS — R109 Unspecified abdominal pain: Secondary | ICD-10-CM | POA: Insufficient documentation

## 2024-07-30 DIAGNOSIS — Z91040 Latex allergy status: Secondary | ICD-10-CM | POA: Insufficient documentation

## 2024-07-30 DIAGNOSIS — M545 Low back pain, unspecified: Secondary | ICD-10-CM | POA: Diagnosis present

## 2024-07-30 MED ORDER — OXYCODONE-ACETAMINOPHEN 5-325 MG PO TABS
1.0000 | ORAL_TABLET | Freq: Once | ORAL | Status: AC
  Administered 2024-07-30: 1 via ORAL
  Filled 2024-07-30: qty 1

## 2024-07-30 NOTE — ED Triage Notes (Signed)
 Patient reports persistent low back pain for several days , denie sinjury or urinary discomfort .

## 2024-07-31 ENCOUNTER — Emergency Department (HOSPITAL_COMMUNITY)

## 2024-07-31 LAB — URINALYSIS, ROUTINE W REFLEX MICROSCOPIC
Bilirubin Urine: NEGATIVE
Glucose, UA: NEGATIVE mg/dL
Ketones, ur: NEGATIVE mg/dL
Leukocytes,Ua: NEGATIVE
Nitrite: NEGATIVE
Protein, ur: NEGATIVE mg/dL
Specific Gravity, Urine: 1.029 (ref 1.005–1.030)
pH: 5 (ref 5.0–8.0)

## 2024-07-31 LAB — CBC WITH DIFFERENTIAL/PLATELET
Abs Immature Granulocytes: 0.04 K/uL (ref 0.00–0.07)
Basophils Absolute: 0.1 K/uL (ref 0.0–0.1)
Basophils Relative: 1 %
Eosinophils Absolute: 0.3 K/uL (ref 0.0–0.5)
Eosinophils Relative: 3 %
HCT: 41.4 % (ref 36.0–46.0)
Hemoglobin: 13.4 g/dL (ref 12.0–15.0)
Immature Granulocytes: 1 %
Lymphocytes Relative: 38 %
Lymphs Abs: 3.2 K/uL (ref 0.7–4.0)
MCH: 29.9 pg (ref 26.0–34.0)
MCHC: 32.4 g/dL (ref 30.0–36.0)
MCV: 92.4 fL (ref 80.0–100.0)
Monocytes Absolute: 0.5 K/uL (ref 0.1–1.0)
Monocytes Relative: 6 %
Neutro Abs: 4.3 K/uL (ref 1.7–7.7)
Neutrophils Relative %: 51 %
Platelets: 371 K/uL (ref 150–400)
RBC: 4.48 MIL/uL (ref 3.87–5.11)
RDW: 13.1 % (ref 11.5–15.5)
WBC: 8.4 K/uL (ref 4.0–10.5)
nRBC: 0 % (ref 0.0–0.2)

## 2024-07-31 LAB — COMPREHENSIVE METABOLIC PANEL WITH GFR
ALT: 24 U/L (ref 0–44)
AST: 24 U/L (ref 15–41)
Albumin: 4 g/dL (ref 3.5–5.0)
Alkaline Phosphatase: 94 U/L (ref 38–126)
Anion gap: 10 (ref 5–15)
BUN: 19 mg/dL (ref 6–20)
CO2: 26 mmol/L (ref 22–32)
Calcium: 9.5 mg/dL (ref 8.9–10.3)
Chloride: 104 mmol/L (ref 98–111)
Creatinine, Ser: 1.12 mg/dL — ABNORMAL HIGH (ref 0.44–1.00)
GFR, Estimated: 59 mL/min — ABNORMAL LOW
Glucose, Bld: 116 mg/dL — ABNORMAL HIGH (ref 70–99)
Potassium: 3.7 mmol/L (ref 3.5–5.1)
Sodium: 140 mmol/L (ref 135–145)
Total Bilirubin: 0.2 mg/dL (ref 0.0–1.2)
Total Protein: 7.2 g/dL (ref 6.5–8.1)

## 2024-07-31 LAB — HCG, SERUM, QUALITATIVE: Preg, Serum: NEGATIVE

## 2024-07-31 MED ORDER — METHYLPREDNISOLONE 4 MG PO TBPK: ORAL_TABLET | ORAL | 0 refills | Status: AC

## 2024-07-31 MED ORDER — HYDROXYZINE HCL 25 MG PO TABS
50.0000 mg | ORAL_TABLET | Freq: Once | ORAL | Status: AC
  Administered 2024-07-31: 50 mg via ORAL
  Filled 2024-07-31 (×2): qty 2

## 2024-07-31 MED ORDER — HYDROCODONE-ACETAMINOPHEN 5-325 MG PO TABS
2.0000 | ORAL_TABLET | Freq: Once | ORAL | Status: AC
  Administered 2024-07-31: 2 via ORAL
  Filled 2024-07-31: qty 2

## 2024-07-31 MED ORDER — IOHEXOL 350 MG/ML SOLN
100.0000 mL | Freq: Once | INTRAVENOUS | Status: AC | PRN
  Administered 2024-07-31: 100 mL via INTRAVENOUS

## 2024-07-31 MED ORDER — METHOCARBAMOL 500 MG PO TABS: 500.0000 mg | ORAL_TABLET | Freq: Four times a day (QID) | ORAL | 0 refills | Status: AC | PRN

## 2024-07-31 MED ORDER — FAMOTIDINE 20 MG PO TABS
20.0000 mg | ORAL_TABLET | Freq: Once | ORAL | Status: AC
  Administered 2024-07-31: 20 mg via ORAL
  Filled 2024-07-31: qty 1

## 2024-07-31 MED ORDER — HYDROCODONE-ACETAMINOPHEN 5-325 MG PO TABS: 1.0000 | ORAL_TABLET | Freq: Four times a day (QID) | ORAL | 0 refills | Status: AC | PRN

## 2024-07-31 MED ORDER — METHYLPREDNISOLONE SODIUM SUCC 40 MG IJ SOLR
40.0000 mg | Freq: Once | INTRAMUSCULAR | Status: AC
  Administered 2024-07-31: 40 mg via INTRAVENOUS
  Filled 2024-07-31: qty 1

## 2024-07-31 NOTE — ED Provider Notes (Signed)
 " [facility] AT [facility] Provider Note   CSN: [id number] Arrival date & time: 07/30/24  2326     Patient presents with: Back Pain   Rebecca Soto is a 54 y.o. female.   HPI Patient reports she has had a left lower back pain for almost a week now.  She denies there was any injury or fall.  She was seen in the emergency department 1\5\26.  At that time x-rays were done and no acute abnormality identified.  Patient did not have a UTI.  She was given Decadron  injection and recommended to use Lidoderm  patches.  She reports pain has persisted and is worsening.  Is giving her spasms of pain.  She identifies her left lower back about the area of the SI joint and slightly higher.  She denies she has any weakness numbness to the leg.  She reports certain positions and twisting however given the extreme spasm and pain.  She denies any prior history of back problems.  No nausea no vomiting no fever.  No cough no chest pain no shortness of breath.    Prior to Admission medications  Medication Sig Start Date End Date Taking? Authorizing Provider  HYDROcodone -acetaminophen  (NORCO/VICODIN) 5-325 MG tablet Take 1-2 tablets by mouth every 6 (six) hours as needed. 07/31/24  Yes Armenta Canning, MD  methocarbamol  (ROBAXIN ) 500 MG tablet Take 1 tablet (500 mg total) by mouth every 6 (six) hours as needed for muscle spasms. 07/31/24  Yes Armenta Canning, MD  methylPREDNISolone  (MEDROL  DOSEPAK) 4 MG TBPK tablet Per dose pack instruction 07/31/24  Yes Chiquitta Matty, Canning, MD  albuterol  (PROVENTIL ) (2.5 MG/3ML) 0.083% nebulizer solution Take 3 mLs (2.5 mg total) by nebulization every 6 (six) hours as needed for wheezing or shortness of breath. 02/13/24   Tysinger, Alm RAMAN, PA-C  albuterol  (VENTOLIN  HFA) 108 (90 Base) MCG/ACT inhaler TAKE 2 PUFFS BY MOUTH EVERY 6 HOURS AS NEEDED FOR WHEEZE OR SHORTNESS OF BREATH 02/13/24   Tysinger, Alm RAMAN, PA-C  amLODipine  (NORVASC ) 5 MG tablet TAKE 1  TABLET (5 MG TOTAL) BY MOUTH DAILY. 02/26/24   Tysinger, Alm RAMAN, PA-C  Cholecalciferol (VITAMIN D ) 50 MCG (2000 UT) CAPS Take 1 capsule (2,000 Units total) by mouth daily. 05/15/23   Tysinger, Alm RAMAN, PA-C  cloNIDine (CATAPRES) 0.2 MG tablet Take 0.2 mg by mouth 2 (two) times daily. 05/26/24   [provider]  fluticasone  (FLONASE ) 50 MCG/ACT nasal spray Place 2 sprays into both nostrils daily. 04/02/24   Jha, Panav, MD  fluticasone -salmeterol (WIXELA INHUB) 250-50 MCG/ACT AEPB Inhale 1 puff into the lungs 2 (two) times daily. in the morning and at bedtime. 02/13/24   Tysinger, Alm RAMAN, PA-C  ibuprofen (ADVIL) 600 MG tablet Take 1 tablet by mouth every 8 (eight) hours as needed.    [provider]  levothyroxine  (SYNTHROID ) 50 MCG tablet Take 1 tablet (50 mcg total) by mouth daily. 05/15/23   Tysinger, Alm RAMAN, PA-C  lidocaine  (LIDODERM ) 5 % Place 1 patch onto the skin daily. Remove & Discard patch within 12 hours or as directed by MD 07/26/24   Torrence Marry RAMAN, PA-C  meloxicam  (MOBIC ) 15 MG tablet Take 1 tablet (15 mg total) by mouth daily. 06/21/24 10/19/24  Janit Thresa HERO, DPM  methylPREDNISolone  (MEDROL  DOSEPAK) 4 MG TBPK tablet 6 day dose pack - take as directed 06/21/24   Evans, Brent M, DPM  metoprolol  tartrate (LOPRESSOR ) 50 MG tablet Take 50 mg by mouth 2 (two)  times daily. 05/26/24   [provider]  montelukast  (SINGULAIR ) 10 MG tablet TAKE 1 TABLET BY MOUTH EVERYDAY AT BEDTIME 05/15/23   Tysinger, Alm RAMAN, PA-C  oxybutynin  (DITROPAN ) 5 MG tablet TAKE 1 TABLET BY MOUTH TWICE A DAY 03/17/24   Tysinger, Alm RAMAN, PA-C  rosuvastatin  (CRESTOR ) 10 MG tablet TAKE 1 TABLET BY MOUTH EVERY DAY 02/26/24   Tysinger, Alm RAMAN, PA-C  valACYclovir  (VALTREX ) 1000 MG tablet TAKE 2 TABLETS BY MOUTH EVERY 12HOURS X 1 DAY. START ASAP AT ONSET OF SYMPTOMS. 05/15/23   Tysinger, Alm RAMAN, PA-C  venlafaxine  XR (EFFEXOR -XR) 37.5 MG 24 hr capsule TAKE 2 CAPSULES (75 MG) BY MOUTH DAILY WITH  BREAKFAST. 04/12/24   Jha, Panav, MD  Vitamin D , Ergocalciferol , (DRISDOL ) 1.25 MG (50000 UNIT) CAPS capsule TAKE 1 CAPSULE (50,000 UNITS TOTAL) BY MOUTH EVERY 7 (SEVEN) DAYS 03/15/24   Tysinger, Alm RAMAN, PA-C  WEGOVY  0.25 MG/0.5ML SOAJ SQ injection SMARTSIG:0.5 Milliliter(s) SUB-Q Once a Week 06/01/24   [provider]    Allergies: Chlorpheniramine , Diphenhydramine hcl, Iodinated contrast media, Other, Aspirin, Diphenhydramine, Iohexol , and Latex    Review of Systems  Updated Vital Signs BP (!) 167/99   Pulse 83   Temp 98.4 F (36.9 C) (Oral)   Resp 18   SpO2 99%   Physical Exam Constitutional:      Comments: Alert nontoxic.  Mental status clear.  No respiratory distress.  HENT:     Mouth/Throat:     Pharynx: Oropharynx is clear.  Cardiovascular:     Rate and Rhythm: Normal rate and regular rhythm.  Pulmonary:     Effort: Pulmonary effort is normal.     Breath sounds: Normal breath sounds.  Abdominal:     General: There is no distension.     Palpations: Abdomen is soft.     Tenderness: There is no abdominal tenderness. There is no guarding.  Musculoskeletal:     Comments: No rash or soft tissue abnormality to the back.  No midline vertebral body tenderness to palpation.  Patient does have discomfort over the SI joint and higher slightly to the CVA region.  No lower extremity edema.  Calves are soft nontender.  Patient does experience exacerbation of pain with straight leg raise on the left.  Skin:    General: Skin is warm.  Neurological:     General: No focal deficit present.     Mental Status: She is oriented to person, place, and time.     Motor: No weakness.     Coordination: Coordination normal.  Psychiatric:        Mood and Affect: Mood normal.     (all labs ordered are listed, but only abnormal results are displayed) Labs Reviewed  URINALYSIS, ROUTINE W REFLEX MICROSCOPIC - Abnormal; Notable for the following components:      Result Value   APPearance  HAZY (*)    Hgb urine dipstick MODERATE (*)    Bacteria, UA RARE (*)    All other components within normal limits  COMPREHENSIVE METABOLIC PANEL WITH GFR - Abnormal; Notable for the following components:   Glucose, Bld 116 (*)    Creatinine, Ser 1.12 (*)    GFR, Estimated 59 (*)    All other components within normal limits  CBC WITH DIFFERENTIAL/PLATELET  HCG, SERUM, QUALITATIVE    EKG: None  Radiology: CT Renal Stone Study Result Date: 07/31/2024 CLINICAL DATA:  Abdominal and flank pain.  Kidney stones suspected. EXAM: CT ABDOMEN AND PELVIS WITHOUT  CONTRAST TECHNIQUE: Multidetector CT imaging of the abdomen and pelvis was performed following the standard protocol without IV contrast. RADIATION DOSE REDUCTION: This exam was performed according to the departmental dose-optimization program which includes automated exposure control, adjustment of the mA and/or kV according to patient size and/or use of iterative reconstruction technique. COMPARISON:  12/24/2019 FINDINGS: Lower chest: No acute findings. Hepatobiliary: No suspicious focal abnormality in the liver on this study without intravenous contrast. There is no evidence for gallstones, gallbladder wall thickening, or pericholecystic fluid. No intrahepatic or extrahepatic biliary dilation. Pancreas: No focal mass lesion. No dilatation of the main duct. No intraparenchymal cyst. No peripancreatic edema. Spleen: No splenomegaly. No suspicious focal mass lesion. Adrenals/Urinary Tract: No adrenal nodule or mass. 8 mm subcapsular mildly hyperattenuating lesion identified upper pole left kidney, stable since the 2021 exam consistent with benign etiology. 16 mm low-density lesion interpolar left kidney cannot be definitively characterized but was also present previously measuring up to 2.1 cm at that time and compatible with a benign etiology such as cyst. Punctate nonobstructing stone noted lower pole left kidney on 38/4. 13 mm low-density lesion  lower pole right kidney on 38/4 is also unchanged in the interval consistent with benign etiology. Although subtle, suspect 8 mm subcapsular lesion posterior interpolar right kidney on 31/4, too small to definitively characterize but statistically most likely benign. No specific imaging follow-up of the renal lesions recommended. Punctate 1 mm nonobstructing stones are seen in the lower pole of both kidneys best seen on coronal images 45 and 47 of series 7. No evidence for hydroureter. The urinary bladder appears normal for the degree of distention. Stomach/Bowel: Stomach is unremarkable. No gastric wall thickening. No evidence of outlet obstruction. Duodenum is normally positioned as is the ligament of Treitz. No small bowel wall thickening. No small bowel dilatation. The terminal ileum is normal. The appendix is normal. No gross colonic mass. No colonic wall thickening. Vascular/Lymphatic: No abdominal aortic aneurysm. No abdominal aortic atherosclerotic calcification. Subtle haziness seen around the proximal left common iliac artery and vein (axial 60/4 and coronal 59/7. There is no gastrohepatic or hepatoduodenal ligament lymphadenopathy. No retroperitoneal or mesenteric lymphadenopathy. No pelvic sidewall lymphadenopathy. Reproductive: Hysterectomy.  There is no adnexal mass. Other: No substantial intraperitoneal free fluid. Musculoskeletal: No worrisome lytic or sclerotic osseous abnormality. IMPRESSION: 1. Punctate nonobstructing stones in the lower pole of both kidneys. No ureteral or bladder stones. No secondary changes in either kidney or ureter. 2. Subtle haziness around the proximal left common iliac artery and vein. This is nonspecific but can be seen in the setting of vasculitis. CT abdomen and pelvis with intravenous contrast may prove helpful to further evaluate as clinically warranted. 3. Otherwise no acute findings in the abdomen or pelvis. Electronically Signed   By: Camellia Candle M.D.   On:  07/31/2024 09:36     Procedures   Medications Ordered in the ED  hydrOXYzine  (ATARAX ) tablet 50 mg (has no administration in time range)  HYDROcodone -acetaminophen  (NORCO/VICODIN) 5-325 MG per tablet 2 tablet (has no administration in time range)  oxyCODONE -acetaminophen  (PERCOCET/ROXICET) 5-325 MG per tablet 1 tablet (1 tablet Oral Given 07/30/24 2344)  HYDROcodone -acetaminophen  (NORCO/VICODIN) 5-325 MG per tablet 2 tablet (2 tablets Oral Given 07/31/24 0951)  methylPREDNISolone  sodium succinate (SOLU-MEDROL ) 40 mg/mL injection 40 mg (40 mg Intravenous Given 07/31/24 1237)  famotidine  (PEPCID ) tablet 20 mg (20 mg Oral Given 07/31/24 1237)  Medical Decision Making Amount and/or Complexity of Data Reviewed Labs: ordered. Radiology: ordered.  Risk Prescription drug management.  Patient presents with back pain that has been persisting and worsening in the left lower back.  Patient previously been treated for musculoskeletal pain.  She reports symptoms are not improving.  She is getting a lot of spasming quality of pain and making it difficult for her to twist bend or move.  She does not have any radiation into the leg or lower extremity weakness.  Consideration for possible kidney stone.  Will obtain CT imaging.  CT shows some small nonoccluding stones in the kidneys but no ureteral stones.  However of note there is some inflammatory stranding around the common iliac artery and vein.  I discussed this with the radiologist for recommended studying and at this time we concluded CT angiogram to further evaluate the arterial structures as well as venous for any clot or occlusion.  Patient reports that at her contrast allergy  is the result of getting hives when she was in high school and got a CT scan done. She does not really remember the circumstances. No history of severe reaction. Patient had coronary artery CT done in 2022 without any adverse reaction. Patient  reports that she does not think she has significant contrast allergy  and is agreeable to proceed with CT angiogram.   Patient is pretreatment for her send coronary CT angiogram in 2022 was a Solu-Medrol  Vistaril  and Pepcid .  Will use this formula.  If no vascular findings or intra-abdominal findings are identified, patient will need treatment for musculoskeletal pain.  Will plan for steroid, narcotic and muscle relaxer.  Dr. Dasie to follow-up on contrast CT for any vascular abnormality.  If no vascular or significant other abnormality notified, anticipate patient will be stable for discharge and treatment for musculoskeletal back pain.     Final diagnoses:  Acute left-sided low back pain without sciatica    ED Discharge Orders          Ordered    HYDROcodone -acetaminophen  (NORCO/VICODIN) 5-325 MG tablet  Every 6 hours PRN        07/31/24 1533    methocarbamol  (ROBAXIN ) 500 MG tablet  Every 6 hours PRN        07/31/24 1533    methylPREDNISolone  (MEDROL  DOSEPAK) 4 MG TBPK tablet        07/31/24 1533               Armenta Canning, MD 07/31/24 1537  "

## 2024-07-31 NOTE — ED Notes (Signed)
 Patient ambulated to bathroom independently with a shuffling gait, reports back pain with movement but able to tolerate pain at this time. Patient slow to ambulate, sit up, and get out of bed but required no assistance.

## 2024-07-31 NOTE — ED Notes (Signed)
 Patient give solu-medrol  at 12:37, Atarax  to be given at 15:37 and CT scan at 16:37. CT and patient aware of plan.

## 2024-07-31 NOTE — ED Provider Notes (Signed)
 Patient signed out to me by Dr. Ismael pending results of CT scanning which did not show any evidence of vasculitis.  Suspect musculoskeletal etiology.  Will prescribe medications and discharged with return precautions   Dasie Faden, MD 07/31/24 [id number]

## 2024-07-31 NOTE — Discharge Instructions (Signed)
 1.  Start Medrol  Dosepak tomorrow morning. 2.  May take 1-2 Vicodin tablets as needed for pain every 6 hours.  As soon as pain is improving, changed to Exer strength Tylenol .  Vicodin is narcotic medication and can be addicting or cause constipation. 3.  You may take Robaxin , a muscle relaxer every 6 hours as needed for muscle spasms.  You may also continue to apply Lidoderm  patches per instructions. 4.  Make an appointment to see your doctor for recheck next week to see if your symptoms are improving.  Return to emergency department if you have weakness or numbness of the legs, loss of control of bowel or bladder or other concerning symptoms.

## 2024-07-31 NOTE — ED Notes (Signed)
 Pt to CT
